# Patient Record
Sex: Male | Born: 1955 | Race: White | Hispanic: No | Marital: Married | Smoking: Never smoker
Health system: Southern US, Community
[De-identification: ages and names within clinical notes are randomized; demographics above are authoritative.]

## PROBLEM LIST (undated history)

## (undated) DIAGNOSIS — J45909 Unspecified asthma, uncomplicated: Secondary | ICD-10-CM

## (undated) DIAGNOSIS — L309 Dermatitis, unspecified: Secondary | ICD-10-CM

## (undated) DIAGNOSIS — R011 Cardiac murmur, unspecified: Secondary | ICD-10-CM

## (undated) DIAGNOSIS — I340 Nonrheumatic mitral (valve) insufficiency: Secondary | ICD-10-CM

## (undated) DIAGNOSIS — I10 Essential (primary) hypertension: Secondary | ICD-10-CM

## (undated) DIAGNOSIS — I2510 Atherosclerotic heart disease of native coronary artery without angina pectoris: Secondary | ICD-10-CM

## (undated) DIAGNOSIS — G4733 Obstructive sleep apnea (adult) (pediatric): Secondary | ICD-10-CM

## (undated) DIAGNOSIS — M109 Gout, unspecified: Secondary | ICD-10-CM

## (undated) DIAGNOSIS — I272 Pulmonary hypertension, unspecified: Secondary | ICD-10-CM

## (undated) DIAGNOSIS — I5042 Chronic combined systolic (congestive) and diastolic (congestive) heart failure: Secondary | ICD-10-CM

## (undated) DIAGNOSIS — M1990 Unspecified osteoarthritis, unspecified site: Secondary | ICD-10-CM

## (undated) HISTORY — PX: APPENDECTOMY: SHX54

## (undated) HISTORY — PX: COLONOSCOPY: SHX174

## (undated) HISTORY — PX: ESOPHAGOGASTRODUODENOSCOPY: SHX1529

## (undated) HISTORY — PX: BACK SURGERY: SHX140

## (undated) HISTORY — PX: LUMBAR DISC SURGERY: SHX700

## (undated) HISTORY — PX: INGUINAL HERNIA REPAIR: SUR1180

---

## 2001-04-30 ENCOUNTER — Encounter: Admission: RE | Admit: 2001-04-30 | Discharge: 2001-04-30 | Payer: Self-pay | Admitting: Neurosurgery

## 2001-04-30 ENCOUNTER — Encounter: Payer: Self-pay | Admitting: Neurosurgery

## 2001-05-14 ENCOUNTER — Encounter: Payer: Self-pay | Admitting: Neurosurgery

## 2001-05-14 ENCOUNTER — Encounter: Admission: RE | Admit: 2001-05-14 | Discharge: 2001-05-14 | Payer: Self-pay | Admitting: Neurosurgery

## 2001-07-10 ENCOUNTER — Encounter: Admission: RE | Admit: 2001-07-10 | Discharge: 2001-07-10 | Payer: Self-pay | Admitting: Neurosurgery

## 2001-07-10 ENCOUNTER — Encounter: Payer: Self-pay | Admitting: Neurosurgery

## 2002-05-05 ENCOUNTER — Ambulatory Visit (HOSPITAL_COMMUNITY): Admission: RE | Admit: 2002-05-05 | Discharge: 2002-05-06 | Payer: Self-pay | Admitting: Cardiovascular Disease

## 2007-06-01 ENCOUNTER — Ambulatory Visit: Payer: Self-pay | Admitting: Cardiology

## 2010-09-26 ENCOUNTER — Other Ambulatory Visit: Payer: Self-pay | Admitting: Medical

## 2012-03-20 ENCOUNTER — Encounter (HOSPITAL_COMMUNITY): Payer: Self-pay | Admitting: *Deleted

## 2012-03-20 ENCOUNTER — Emergency Department (HOSPITAL_COMMUNITY)
Admission: EM | Admit: 2012-03-20 | Discharge: 2012-03-20 | Disposition: A | Discharge status: Home / Self Care | Payer: BC Managed Care – PPO | Attending: Emergency Medicine | Admitting: Emergency Medicine

## 2012-03-20 DIAGNOSIS — Z79899 Other long term (current) drug therapy: Secondary | ICD-10-CM | POA: Insufficient documentation

## 2012-03-20 DIAGNOSIS — T4995XA Adverse effect of unspecified topical agent, initial encounter: Secondary | ICD-10-CM | POA: Insufficient documentation

## 2012-03-20 DIAGNOSIS — J029 Acute pharyngitis, unspecified: Secondary | ICD-10-CM | POA: Insufficient documentation

## 2012-03-20 DIAGNOSIS — J45909 Unspecified asthma, uncomplicated: Secondary | ICD-10-CM | POA: Insufficient documentation

## 2012-03-20 DIAGNOSIS — T7840XA Allergy, unspecified, initial encounter: Secondary | ICD-10-CM

## 2012-03-20 DIAGNOSIS — IMO0002 Reserved for concepts with insufficient information to code with codable children: Secondary | ICD-10-CM | POA: Insufficient documentation

## 2012-03-20 DIAGNOSIS — R220 Localized swelling, mass and lump, head: Secondary | ICD-10-CM | POA: Insufficient documentation

## 2012-03-20 DIAGNOSIS — I10 Essential (primary) hypertension: Secondary | ICD-10-CM | POA: Insufficient documentation

## 2012-03-20 HISTORY — DX: Essential (primary) hypertension: I10

## 2012-03-20 HISTORY — DX: Unspecified asthma, uncomplicated: J45.909

## 2012-03-20 MED ORDER — FAMOTIDINE IN NACL 20-0.9 MG/50ML-% IV SOLN
20.0000 mg | Freq: Once | INTRAVENOUS | Status: AC
  Administered 2012-03-20: 20 mg via INTRAVENOUS
  Filled 2012-03-20: qty 50

## 2012-03-20 MED ORDER — PREDNISONE 10 MG PO TABS: 40.0000 mg | ORAL_TABLET | Freq: Every day | ORAL | Status: DC

## 2012-03-20 MED ORDER — METHYLPREDNISOLONE SODIUM SUCC 125 MG IJ SOLR
125.0000 mg | Freq: Once | INTRAMUSCULAR | Status: AC
  Administered 2012-03-20: 125 mg via INTRAVENOUS
  Filled 2012-03-20: qty 2

## 2012-03-20 MED ORDER — SODIUM CHLORIDE 0.9 % IV SOLN
INTRAVENOUS | Status: DC
  Administered 2012-03-20: 22:00:00 via INTRAVENOUS

## 2012-03-20 MED ORDER — FAMOTIDINE 20 MG PO TABS: 20.0000 mg | ORAL_TABLET | Freq: Two times a day (BID) | ORAL | Status: DC

## 2012-03-20 NOTE — ED Provider Notes (Signed)
History   Scribed for Shelda Jakes, MD, the patient was seen in room APA09/APA09. This chart was scribed by Lewanda Rife, ED scribe. Patient's care was started at 2121  CSN: [id number]  Arrival date & time 03/20/12  2130   First MD Initiated Contact with Patient 03/20/12 2101      Chief Complaint  Patient presents with  . Allergic Reaction    (Consider location/radiation/quality/duration/timing/severity/associated sxs/prior treatment) HPI Stephen Fleming is a 57 y.o. male who presents to the Emergency Department complaining of an allergic reaction with unknown precipitant onset 1 hour PTA. Spouse reports waking pt up tonight with swollen lips. Pt reports taking 2 tablets of benadryl at home and symptoms have significantly improved. Pt reports sneezing and mild sore throat onset yesterday. Pt reports lips swelling and swelling in posterior throat onset PTA. Pt denies tongue swelling, difficulty breathing, nausea, vomiting, and hives. Pt denies new foods, seafood, new laundry detergent, and new medicine.  Past Medical History  Diagnosis Date  . Hypertension   . Asthma     Past Surgical History  Procedure Laterality Date  . Back surgery    . Appendectomy    . Hernia repair      History reviewed. No pertinent family history.  History  Substance Use Topics  . Smoking status: Never Smoker   . Smokeless tobacco: Not on file  . Alcohol Use: Yes      Review of Systems  Constitutional: Negative for fever and chills.  HENT: Positive for sneezing. Negative for congestion, sore throat and rhinorrhea.   Eyes: Negative for visual disturbance.  Respiratory: Negative for cough.   Cardiovascular: Negative for chest pain.  Gastrointestinal: Negative for abdominal pain.  Genitourinary: Negative for dysuria and hematuria.  Musculoskeletal: Negative for myalgias.  Skin: Negative for rash.  Neurological: Negative for headaches.  A complete 10 system review of systems was  obtained and all systems are negative except as noted in the HPI and PMH.     Allergies  Review of patient's allergies indicates no known allergies.  Home Medications   Current Outpatient Rx  Name  Route  Sig  Dispense  Refill  . albuterol (PROVENTIL HFA;VENTOLIN HFA) 108 (90 BASE) MCG/ACT inhaler   Inhalation   Inhale 2 puffs into the lungs every 6 (six) hours as needed for wheezing.         Marland Kitchen aspirin 325 MG tablet   Oral   Take 325 mg by mouth daily.         . diphenhydrAMINE (BENADRYL) 25 MG tablet   Oral   Take 50 mg by mouth once as needed for itching or allergies.         . Fluticasone-Salmeterol (ADVAIR) 250-50 MCG/DOSE AEPB   Inhalation   Inhale 1 puff into the lungs every 12 (twelve) hours.         . hydrochlorothiazide (HYDRODIURIL) 25 MG tablet   Oral   Take 25 mg by mouth daily.         . Multiple Vitamin (MULTIVITAMIN WITH MINERALS) TABS   Oral   Take 1 tablet by mouth daily.         . potassium chloride SA (K-DUR,KLOR-CON) 20 MEQ tablet   Oral   Take 20 mEq by mouth 2 (two) times daily.         . famotidine (PEPCID) 20 MG tablet   Oral   Take 1 tablet (20 mg total) by mouth 2 (two) times daily.   30 tablet  0   . predniSONE (DELTASONE) 10 MG tablet   Oral   Take 4 tablets (40 mg total) by mouth daily.   20 tablet   0     BP 146/86  Pulse 75  Temp(Src) 97.3 F (36.3 C) (Oral)  Resp 14  Ht 5\' 10"  (1.778 m)  Wt 200 lb (90.719 kg)  BMI 28.7 kg/m2  SpO2 100%  Physical Exam  Nursing note and vitals reviewed. Constitutional: He is oriented to person, place, and time. He appears well-developed and well-nourished. No distress.  HENT:  Head: Normocephalic and atraumatic.  Mouth/Throat: Uvula is midline and oropharynx is clear and moist.  Eyes: Conjunctivae and EOM are normal. Pupils are equal, round, and reactive to light. No scleral icterus.  Neck: Normal range of motion. Neck supple. No tracheal deviation present.   Cardiovascular: Normal rate.   Pulmonary/Chest: Effort normal and breath sounds normal. No respiratory distress.  Abdominal: Soft.  Musculoskeletal: Normal range of motion.  Neurological: He is alert and oriented to person, place, and time.  Skin: Skin is warm and dry. No rash noted. Rash is not urticarial.  Psychiatric: He has a normal mood and affect. His behavior is normal.    ED Course  Procedures (including critical care time)  Medications  0.9 %  sodium chloride infusion ( Intravenous New Bag/Given 03/20/12 2149)  methylPREDNISolone sodium succinate (SOLU-MEDROL) 125 mg/2 mL injection 125 mg (125 mg Intravenous Given 03/20/12 2149)  famotidine (PEPCID) IVPB 20 mg (20 mg Intravenous New Bag/Given 03/20/12 2149)    Labs Reviewed - No data to display No results found.   1. Allergic reaction, initial encounter       MDM  Patient by history was significant allergic reaction at home. Had upper and lower lip swelling some tightness in the throat patient 2 Benadryl tablets at home and was improving by arrival in ED. Patient treated in the emergency department with Solu-Medrol and Pepcid IV since the IV was already established will discharge home with continuing Benadryl for the next 24 hours Pepcid for the next 7 days and prednisone for the next 5 days.     I personally performed the services described in this documentation, which was scribed in my presence. The recorded information has been reviewed and is accurate.     Shelda Jakes, MD 03/20/12 [phone]

## 2012-03-20 NOTE — ED Notes (Signed)
Pt feels swelling of lips, mouth and throat, Took benadryl 2 tabs40 min pta

## 2015-05-07 ENCOUNTER — Emergency Department (HOSPITAL_COMMUNITY)
Admission: EM | Admit: 2015-05-07 | Discharge: 2015-05-08 | Disposition: A | Discharge status: Home / Self Care | Payer: BLUE CROSS/BLUE SHIELD | Attending: Emergency Medicine | Admitting: Emergency Medicine

## 2015-05-07 ENCOUNTER — Encounter (HOSPITAL_COMMUNITY): Payer: Self-pay | Admitting: *Deleted

## 2015-05-07 DIAGNOSIS — J45909 Unspecified asthma, uncomplicated: Secondary | ICD-10-CM | POA: Diagnosis not present

## 2015-05-07 DIAGNOSIS — Z79899 Other long term (current) drug therapy: Secondary | ICD-10-CM | POA: Insufficient documentation

## 2015-05-07 DIAGNOSIS — M25511 Pain in right shoulder: Secondary | ICD-10-CM | POA: Insufficient documentation

## 2015-05-07 DIAGNOSIS — Z7982 Long term (current) use of aspirin: Secondary | ICD-10-CM | POA: Insufficient documentation

## 2015-05-07 DIAGNOSIS — I10 Essential (primary) hypertension: Secondary | ICD-10-CM | POA: Insufficient documentation

## 2015-05-07 DIAGNOSIS — M7989 Other specified soft tissue disorders: Secondary | ICD-10-CM | POA: Diagnosis present

## 2015-05-07 DIAGNOSIS — R600 Localized edema: Secondary | ICD-10-CM | POA: Insufficient documentation

## 2015-05-07 HISTORY — DX: Dermatitis, unspecified: L30.9

## 2015-05-07 NOTE — ED Notes (Signed)
Pt c/o bilateral ankle and foot edema x 2 weeks and right shoulder since Friday.

## 2015-05-07 NOTE — ED Provider Notes (Signed)
CSN: [id number]     Arrival date & time 05/07/15  2229 History  By signing my name below, I, Marisue Humble, attest that this documentation has been prepared under the direction and in the presence of Devoria Albe, MD at 0014 AM . Electronically Signed: Marisue Humble, Scribe. 05/08/2015. 12:28 AM.   Chief Complaint  Patient presents with  . Leg Swelling   The history is provided by the patient. No language interpreter was used.   HPI Comments:  Stephen Fleming is a 60 y.o. male with PMHx of HTN who presents to the Emergency Department complaining of worsening BL ankle and leg swelling for the past 2 weeks, worse in left leg. He notes mild pain in knee radiating down front of right leg. Pt reports associated right shoulder pain onset this morning and chills today from 7 to 10 PM. No alleviating or exacerbating factors noted. He reports he was taken off Lisinopril and Hydrochlorothiazide ~1.5 months ago; he was started on amlodipine. Pt is also currently taking an antibiotic, Septra DS for a rash, currently improving. He has a bottle with a month supply with 2 refills of the same antibiotic. He notes drinking 50 ounces of beer every morning when he gets off of work so "I can sleep". Pt reports his mother fell and died at 21 years old of a possible blood clot in her leg. Denies chest pain, shortness of breath, fever, recent travel, recent immobilization, h/o prior leg swelling, cough, h/o smoking, or abdominal distention.  PCP Dr Margo Common  Past Medical History  Diagnosis Date  . Hypertension   . Asthma   . Eczema    Past Surgical History  Procedure Laterality Date  . Back surgery    . Appendectomy    . Hernia repair     History reviewed. No pertinent family history. Social History  Substance Use Topics  . Smoking status: Never Smoker   . Smokeless tobacco: None  . Alcohol Use: Yes  employed  Review of Systems  Constitutional: Positive for chills. Negative for fever.  Respiratory:  Negative for cough and shortness of breath.   Cardiovascular: Positive for leg swelling. Negative for chest pain.  Gastrointestinal: Negative for abdominal distention.  Musculoskeletal: Positive for arthralgias.  All other systems reviewed and are negative.  Allergies  Review of patient's allergies indicates no known allergies.  Home Medications  Norvasc Allopurinol Aspirin 81 mg daily Albuterol inhaler Potassium pills 4 times a day Septra DS since March 29 prescribed by Dr. Jorja Loa  Prior to Admission medications   Medication Sig Start Date End Date Taking? Authorizing Provider  albuterol (PROVENTIL HFA;VENTOLIN HFA) 108 (90 BASE) MCG/ACT inhaler Inhale 2 puffs into the lungs every 6 (six) hours as needed for wheezing.    Historical Provider, MD  aspirin 325 MG tablet Take 325 mg by mouth daily.    Historical Provider, MD  diphenhydrAMINE (BENADRYL) 25 MG tablet Take 50 mg by mouth once as needed for itching or allergies.    Historical Provider, MD  famotidine (PEPCID) 20 MG tablet Take 1 tablet (20 mg total) by mouth 2 (two) times daily. 03/20/12   Vanetta Mulders, MD  Fluticasone-Salmeterol (ADVAIR) 250-50 MCG/DOSE AEPB Inhale 1 puff into the lungs every 12 (twelve) hours.    Historical Provider, MD  hydrochlorothiazide (HYDRODIURIL) 25 MG tablet Take 25 mg by mouth daily.    Historical Provider, MD  Multiple Vitamin (MULTIVITAMIN WITH MINERALS) TABS Take 1 tablet by mouth daily.    Historical Provider, MD  potassium chloride SA (K-DUR,KLOR-CON) 20 MEQ tablet Take 20 mEq by mouth 2 (two) times daily.    Historical Provider, MD  predniSONE (DELTASONE) 10 MG tablet Take 4 tablets (40 mg total) by mouth daily. 03/20/12   Vanetta Mulders, MD   BP 172/100 mmHg  Pulse 102  Temp(Src) 97.8 F (36.6 C) (Oral)  Resp 18  Ht 5\' 10"  (1.778 m)  Wt 205 lb (92.987 kg)  BMI 29.41 kg/m2  SpO2 98%  Vital signs normal except for hypertension  Physical Exam  Constitutional: He is oriented to  person, place, and time. He appears well-developed and well-nourished.  Non-toxic appearance. He does not appear ill. No distress.  HENT:  Head: Normocephalic and atraumatic.  Right Ear: External ear normal.  Left Ear: External ear normal.  Nose: Nose normal. No mucosal edema or rhinorrhea.  Mouth/Throat: Oropharynx is clear and moist and mucous membranes are normal. No dental abscesses or uvula swelling.  Eyes: Conjunctivae and EOM are normal. Pupils are equal, round, and reactive to light.  Neck: Normal range of motion and full passive range of motion without pain. Neck supple.  Cardiovascular: Normal rate, regular rhythm and normal heart sounds.  Exam reveals no gallop and no friction rub.   No murmur heard. Pulmonary/Chest: Effort normal and breath sounds normal. No respiratory distress. He has no wheezes. He has no rhonchi. He has no rales. He exhibits no tenderness and no crepitus.  Abdominal: Soft. Normal appearance and bowel sounds are normal. He exhibits no distension. There is no tenderness. There is no rebound and no guarding.  Abdomen is distended, but patient states is his normal  Musculoskeletal: Normal range of motion. He exhibits edema. He exhibits no tenderness.  Moves all extremities well. 1+ pitting edema of ankles and proximal dorsum of feet  Neurological: He is alert and oriented to person, place, and time. He has normal strength. No cranial nerve deficit.  Skin: Skin is warm, dry and intact. No rash noted. No erythema. No pallor.  Psychiatric: He has a normal mood and affect. His speech is normal and behavior is normal. His mood appears not anxious.  Nursing note and vitals reviewed.   ED Course  Procedures   Medications  furosemide (LASIX) injection 60 mg (60 mg Intravenous Given 05/08/15 0109)  enoxaparin (LOVENOX) injection 90 mg (90 mg Subcutaneous Given 05/08/15 0240)    DIAGNOSTIC STUDIES:  Oxygen Saturation is 98% on RA, normal by my interpretation.     COORDINATION OF CARE:  12:25 AM Will order blood work. Discussed treatment plan with pt at bedside and pt agreed to plan.  Patient had good urinary output after the Lasix.  Patient has no chest pain or shortness of breath. His pulse ox is good. He does not have tachycardia after he arrived in ED. He was given Lovenox 1 mg per KG subcutaneous and scheduled to get a outpatient Doppler ultrasound of his legs in the morning.  Labs Review Results for orders placed or performed during the hospital encounter of 05/07/15  Comprehensive metabolic panel  Result Value Ref Range   Sodium 135 135 - 145 mmol/L   Potassium 3.3 (L) 3.5 - 5.1 mmol/L   Chloride 103 101 - 111 mmol/L   CO2 23 22 - 32 mmol/L   Glucose, Bld 106 (H) 65 - 99 mg/dL   BUN 10 6 - 20 mg/dL   Creatinine, Ser 4.76 0.61 - 1.24 mg/dL   Calcium 8.3 (L) 8.9 - 10.3 mg/dL   Total  Protein 7.2 6.5 - 8.1 g/dL   Albumin 3.9 3.5 - 5.0 g/dL   AST 34 15 - 41 U/L   ALT 35 17 - 63 U/L   Alkaline Phosphatase 70 38 - 126 U/L   Total Bilirubin 0.5 0.3 - 1.2 mg/dL   GFR calc non Af Amer >60 >60 mL/min   GFR calc Af Amer >60 >60 mL/min   Anion gap 9 5 - 15  CBC with Differential  Result Value Ref Range   WBC 10.2 4.0 - 10.5 K/uL   RBC 3.96 (L) 4.22 - 5.81 MIL/uL   Hemoglobin 13.6 13.0 - 17.0 g/dL   HCT 96.0 (L) 45.4 - 09.8 %   MCV 96.2 78.0 - 100.0 fL   MCH 34.3 (H) 26.0 - 34.0 pg   MCHC 35.7 30.0 - 36.0 g/dL   RDW 11.9 14.7 - 82.9 %   Platelets 241 150 - 400 K/uL   Neutrophils Relative % 62 %   Neutro Abs 6.3 1.7 - 7.7 K/uL   Lymphocytes Relative 21 %   Lymphs Abs 2.1 0.7 - 4.0 K/uL   Monocytes Relative 12 %   Monocytes Absolute 1.2 (H) 0.1 - 1.0 K/uL   Eosinophils Relative 5 %   Eosinophils Absolute 0.5 0.0 - 0.7 K/uL   Basophils Relative 0 %   Basophils Absolute 0.0 0.0 - 0.1 K/uL  D-dimer, quantitative  Result Value Ref Range   D-Dimer, Quant 0.68 (H) 0.00 - 0.50 ug/mL-FEU   Laboratory interpretation all normal except  mildly elevated d-dimer     MDM   Final diagnoses:  Edema of both legs    Plan discharge  Devoria Albe, MD, FACEP   I personally performed the services described in this documentation, which was scribed in my presence. The recorded information has been reviewed and considered.  Devoria Albe, MD, Concha Pyo, MD 05/08/15 [phone]

## 2015-05-08 ENCOUNTER — Ambulatory Visit (HOSPITAL_COMMUNITY)
Admission: RE | Admit: 2015-05-08 | Discharge: 2015-05-08 | Disposition: A | Discharge status: Home / Self Care | Payer: BLUE CROSS/BLUE SHIELD | Source: Ambulatory Visit | Attending: Emergency Medicine | Admitting: Emergency Medicine

## 2015-05-08 DIAGNOSIS — R791 Abnormal coagulation profile: Secondary | ICD-10-CM | POA: Insufficient documentation

## 2015-05-08 DIAGNOSIS — M7989 Other specified soft tissue disorders: Secondary | ICD-10-CM | POA: Insufficient documentation

## 2015-05-08 LAB — COMPREHENSIVE METABOLIC PANEL
ALBUMIN: 3.9 g/dL (ref 3.5–5.0)
ALT: 35 U/L (ref 17–63)
ANION GAP: 9 (ref 5–15)
AST: 34 U/L (ref 15–41)
Alkaline Phosphatase: 70 U/L (ref 38–126)
BUN: 10 mg/dL (ref 6–20)
CO2: 23 mmol/L (ref 22–32)
Calcium: 8.3 mg/dL — ABNORMAL LOW (ref 8.9–10.3)
Chloride: 103 mmol/L (ref 101–111)
Creatinine, Ser: 0.86 mg/dL (ref 0.61–1.24)
GFR calc non Af Amer: >60 mL/min (ref >60)
GLUCOSE: 106 mg/dL — AB (ref 65–99)
POTASSIUM: 3.3 mmol/L — AB (ref 3.5–5.1)
SODIUM: 135 mmol/L (ref 135–145)
TOTAL PROTEIN: 7.2 g/dL (ref 6.5–8.1)
Total Bilirubin: 0.5 mg/dL (ref 0.3–1.2)

## 2015-05-08 LAB — CBC WITH DIFFERENTIAL/PLATELET
BASOS ABS: 0 10*3/uL (ref 0.0–0.1)
Basophils Relative: 0 %
Eosinophils Absolute: 0.5 10*3/uL (ref 0.0–0.7)
Eosinophils Relative: 5 %
HEMATOCRIT: 38.1 % — AB (ref 39.0–52.0)
HEMOGLOBIN: 13.6 g/dL (ref 13.0–17.0)
LYMPHS PCT: 21 %
Lymphs Abs: 2.1 10*3/uL (ref 0.7–4.0)
MCH: 34.3 pg — ABNORMAL HIGH (ref 26.0–34.0)
MCHC: 35.7 g/dL (ref 30.0–36.0)
MCV: 96.2 fL (ref 78.0–100.0)
MONO ABS: 1.2 10*3/uL — AB (ref 0.1–1.0)
MONOS PCT: 12 %
NEUTROS ABS: 6.3 10*3/uL (ref 1.7–7.7)
NEUTROS PCT: 62 %
Platelets: 241 10*3/uL (ref 150–400)
RBC: 3.96 MIL/uL — ABNORMAL LOW (ref 4.22–5.81)
RDW: 12.5 % (ref 11.5–15.5)
WBC: 10.2 10*3/uL (ref 4.0–10.5)

## 2015-05-08 LAB — D-DIMER, QUANTITATIVE: D-Dimer, Quant: 0.68 ug/mL-FEU — ABNORMAL HIGH (ref 0.00–0.50)

## 2015-05-08 MED ORDER — FUROSEMIDE 10 MG/ML IJ SOLN
60.0000 mg | Freq: Once | INTRAMUSCULAR | Status: AC
  Administered 2015-05-08: 60 mg via INTRAVENOUS
  Filled 2015-05-08: qty 6

## 2015-05-08 MED ORDER — ENOXAPARIN SODIUM 100 MG/ML ~~LOC~~ SOLN
90.0000 mg | Freq: Once | SUBCUTANEOUS | Status: AC
  Administered 2015-05-08: 90 mg via SUBCUTANEOUS
  Filled 2015-05-08: qty 1

## 2015-05-08 NOTE — Discharge Instructions (Signed)
You have an appointment this morning at 9:45 to get a test of your legs to look for blood clots. Check in in radiology about 15 minutes before your appointment. If your test is normal, you should talk to your doctor about getting back on the hydrochlorothiazide. If your test is positive, meaning you have blood clots, you will receive instructions from the ED doctor about how to treat that.     Peripheral Edema You have swelling in your legs (peripheral edema). This swelling is due to excess accumulation of salt and water in your body. Edema may be a sign of heart, kidney or liver disease, or a side effect of a medication. It may also be due to problems in the leg veins. Elevating your legs and using special support stockings may be very helpful, if the cause of the swelling is due to poor venous circulation. Avoid long periods of standing, whatever the cause. Treatment of edema depends on identifying the cause. Chips, pretzels, pickles and other salty foods should be avoided. Restricting salt in your diet is almost always needed. Water pills (diuretics) are often used to remove the excess salt and water from your body via urine. These medicines prevent the kidney from reabsorbing sodium. This increases urine flow. Diuretic treatment may also result in lowering of potassium levels in your body. Potassium supplements may be needed if you have to use diuretics daily. Daily weights can help you keep track of your progress in clearing your edema. You should call your caregiver for follow up care as recommended. SEEK IMMEDIATE MEDICAL CARE IF:   You have increased swelling, pain, redness, or heat in your legs.  You develop shortness of breath, especially when lying down.  You develop chest or abdominal pain, weakness, or fainting.  You have a fever.   This information is not intended to replace advice given to you by your health care provider. Make sure you discuss any questions you have with your health  care provider.   Document Released: 02/15/2004 Document Revised: 04/01/2011 Document Reviewed: 07/20/2014 Elsevier Interactive Patient Education [employer]

## 2015-06-18 ENCOUNTER — Emergency Department (HOSPITAL_COMMUNITY): Payer: BLUE CROSS/BLUE SHIELD

## 2015-06-18 ENCOUNTER — Encounter (HOSPITAL_COMMUNITY): Payer: Self-pay | Admitting: Emergency Medicine

## 2015-06-18 ENCOUNTER — Emergency Department (HOSPITAL_COMMUNITY)
Admission: EM | Admit: 2015-06-18 | Discharge: 2015-06-18 | Disposition: A | Discharge status: Home / Self Care | Payer: BLUE CROSS/BLUE SHIELD | Attending: Emergency Medicine | Admitting: Emergency Medicine

## 2015-06-18 DIAGNOSIS — K5900 Constipation, unspecified: Secondary | ICD-10-CM | POA: Diagnosis present

## 2015-06-18 DIAGNOSIS — Z79899 Other long term (current) drug therapy: Secondary | ICD-10-CM | POA: Insufficient documentation

## 2015-06-18 DIAGNOSIS — J45909 Unspecified asthma, uncomplicated: Secondary | ICD-10-CM | POA: Insufficient documentation

## 2015-06-18 DIAGNOSIS — Z7982 Long term (current) use of aspirin: Secondary | ICD-10-CM | POA: Insufficient documentation

## 2015-06-18 DIAGNOSIS — R109 Unspecified abdominal pain: Secondary | ICD-10-CM | POA: Insufficient documentation

## 2015-06-18 DIAGNOSIS — I10 Essential (primary) hypertension: Secondary | ICD-10-CM | POA: Insufficient documentation

## 2015-06-18 LAB — URINALYSIS, ROUTINE W REFLEX MICROSCOPIC
Bilirubin Urine: NEGATIVE
Glucose, UA: NEGATIVE mg/dL
Hgb urine dipstick: NEGATIVE
KETONES UR: NEGATIVE mg/dL
LEUKOCYTES UA: NEGATIVE
NITRITE: NEGATIVE
PH: 6 (ref 5.0–8.0)
Protein, ur: NEGATIVE mg/dL
SPECIFIC GRAVITY, URINE: 1.015 (ref 1.005–1.030)

## 2015-06-18 LAB — CBC
HEMATOCRIT: 44.9 % (ref 39.0–52.0)
HEMOGLOBIN: 15.5 g/dL (ref 13.0–17.0)
MCH: 34.1 pg — ABNORMAL HIGH (ref 26.0–34.0)
MCHC: 34.5 g/dL (ref 30.0–36.0)
MCV: 98.7 fL (ref 78.0–100.0)
Platelets: 199 10*3/uL (ref 150–400)
RBC: 4.55 MIL/uL (ref 4.22–5.81)
RDW: 12.5 % (ref 11.5–15.5)
WBC: 15 10*3/uL — AB (ref 4.0–10.5)

## 2015-06-18 LAB — COMPREHENSIVE METABOLIC PANEL
ALT: 69 U/L — ABNORMAL HIGH (ref 17–63)
ANION GAP: 10 (ref 5–15)
AST: 47 U/L — ABNORMAL HIGH (ref 15–41)
Albumin: 4.1 g/dL (ref 3.5–5.0)
Alkaline Phosphatase: 57 U/L (ref 38–126)
BILIRUBIN TOTAL: 0.8 mg/dL (ref 0.3–1.2)
BUN: 17 mg/dL (ref 6–20)
CHLORIDE: 104 mmol/L (ref 101–111)
CO2: 25 mmol/L (ref 22–32)
Calcium: 8.9 mg/dL (ref 8.9–10.3)
Creatinine, Ser: 0.69 mg/dL (ref 0.61–1.24)
GFR calc Af Amer: >60 mL/min (ref >60)
Glucose, Bld: 100 mg/dL — ABNORMAL HIGH (ref 65–99)
POTASSIUM: 3.3 mmol/L — AB (ref 3.5–5.1)
Sodium: 139 mmol/L (ref 135–145)
TOTAL PROTEIN: 7.2 g/dL (ref 6.5–8.1)

## 2015-06-18 LAB — LIPASE, BLOOD: LIPASE: 41 U/L (ref 11–51)

## 2015-06-18 NOTE — Discharge Instructions (Signed)
Take over the counter laxative (such as miralax, milk of magnesia, senokot) AND a dulcolax suppository (or enema) today and repeat both tomorrow.  Begin to take over the counter stool softener (colace, miralax), as directed on packaging, for the next month.  Continue to take your usual prescriptions as previously directed.  Call your regular medical doctor on Monday to schedule a follow up appointment this week. Return to the Emergency Department immediately if worsening.

## 2015-06-18 NOTE — ED Provider Notes (Signed)
CSN: [id number]     Arrival date & time 06/18/15  1649 History   First MD Initiated Contact with Patient 06/18/15 1907     Chief Complaint  Patient presents with  . Constipation      HPI  Pt was seen at 1910. Per pt, c/o gradual onset and persistence of constant "constipation" that began today. Pt states he has a BM every morning, and did not have one this morning. Last BM was yesterday. Pt states he took a suppository, but "I think it was old because it didn't work." Pt states also that he "missed a few days" taking his stool softener, probiotics and fiber supplements. States he is having abd "cramping" and "it feels like I have to go." Denies N/V, no CP/SOB, no fevers, no black or blood in stools, no rectal pain.    Past Medical History  Diagnosis Date  . Hypertension   . Asthma   . Eczema    Past Surgical History  Procedure Laterality Date  . Back surgery    . Appendectomy    . Hernia repair      Social History  Substance Use Topics  . Smoking status: Never Smoker   . Smokeless tobacco: None  . Alcohol Use: Yes    Review of Systems ROS: Statement: All systems negative except as marked or noted in the HPI; Constitutional: Negative for fever and chills. ; ; Eyes: Negative for eye pain, redness and discharge. ; ; ENMT: Negative for ear pain, hoarseness, nasal congestion, sinus pressure and sore throat. ; ; Cardiovascular: Negative for chest pain, palpitations, diaphoresis, dyspnea and peripheral edema. ; ; Respiratory: Negative for cough, wheezing and stridor. ; ; Gastrointestinal: +constipation, abd pain. Negative for nausea, vomiting, diarrhea, blood in stool, hematemesis, jaundice and rectal bleeding. . ; ; Genitourinary: Negative for dysuria, flank pain and hematuria. ; ; Musculoskeletal: Negative for back pain and neck pain. Negative for swelling and trauma.; ; Skin: Negative for pruritus, rash, abrasions, blisters, bruising and skin lesion.; ; Neuro: Negative for headache,  lightheadedness and neck stiffness. Negative for weakness, altered level of consciousness, altered mental status, extremity weakness, paresthesias, involuntary movement, seizure and syncope.      Allergies  Review of patient's allergies indicates no known allergies.  Home Medications   Prior to Admission medications   Medication Sig Start Date End Date Taking? Authorizing Provider  albuterol (PROVENTIL HFA;VENTOLIN HFA) 108 (90 BASE) MCG/ACT inhaler Inhale 2 puffs into the lungs every 6 (six) hours as needed for wheezing.    Historical Provider, MD  aspirin 325 MG tablet Take 325 mg by mouth daily.    Historical Provider, MD  diphenhydrAMINE (BENADRYL) 25 MG tablet Take 50 mg by mouth once as needed for itching or allergies.    Historical Provider, MD  famotidine (PEPCID) 20 MG tablet Take 1 tablet (20 mg total) by mouth 2 (two) times daily. 03/20/12   Vanetta Mulders, MD  Fluticasone-Salmeterol (ADVAIR) 250-50 MCG/DOSE AEPB Inhale 1 puff into the lungs every 12 (twelve) hours.    Historical Provider, MD  hydrochlorothiazide (HYDRODIURIL) 25 MG tablet Take 25 mg by mouth daily.    Historical Provider, MD  Multiple Vitamin (MULTIVITAMIN WITH MINERALS) TABS Take 1 tablet by mouth daily.    Historical Provider, MD  potassium chloride SA (K-DUR,KLOR-CON) 20 MEQ tablet Take 20 mEq by mouth 2 (two) times daily.    Historical Provider, MD  predniSONE (DELTASONE) 10 MG tablet Take 4 tablets (40 mg total) by mouth daily.  03/20/12   Vanetta Mulders, MD   BP 172/99 mmHg  Pulse 99  Temp(Src) 98.4 F (36.9 C) (Oral)  Resp 18  Ht 5\' 10"  (1.778 m)  Wt 205 lb (92.987 kg)  BMI 29.41 kg/m2  SpO2 98% Physical Exam  1915: Physical examination:  Nursing notes reviewed; Vital signs and O2 SAT reviewed;  Constitutional: Well developed, Well nourished, Well hydrated, In no acute distress; Head:  Normocephalic, atraumatic; Eyes: EOMI, PERRL, No scleral icterus; ENMT: Mouth and pharynx normal, Mucous membranes  moist; Neck: Supple, Full range of motion, No lymphadenopathy; Cardiovascular: Regular rate and rhythm, No gallop; Respiratory: Breath sounds clear & equal bilaterally, No wheezes.  Speaking full sentences with ease, Normal respiratory effort/excursion; Chest: Nontender, Movement normal; Abdomen: Soft, Nontender, Nondistended, Normal bowel sounds; Genitourinary: No CVA tenderness; Extremities: Pulses normal, No tenderness, No edema, No calf edema or asymmetry.; Neuro: AA&Ox3, Major CN grossly intact.  Speech clear. No gross focal motor or sensory deficits in extremities. Climbs on and off stretcher easily by himself. Gait steady.; Skin: Color normal, Warm, Dry.   ED Course  Procedures (including critical care time) Labs Review   Imaging Review  I have personally reviewed and evaluated these images and lab results as part of my medical decision-making.   EKG Interpretation None      MDM  MDM Reviewed: previous chart, nursing note and vitals Reviewed previous: labs Interpretation: labs and x-ray     Dg Abd Acute W/chest 06/18/2015  CLINICAL DATA:  Constipation EXAM: DG ABDOMEN ACUTE W/ 1V CHEST COMPARISON:  None. FINDINGS: There is no evidence of dilated bowel loops or free intraperitoneal air. No radiopaque calculi or other significant radiographic abnormality is seen. Mild to moderate amount of stool in the right and left colon and rectum. Lumbar degenerative changes and spurring no acute skeletal abnormality. Heart size and mediastinal contours are within normal limits. Both lungs are clear. IMPRESSION: No active cardiopulmonary disease Mild to moderate stool in the colon.  Normal bowel gas pattern. Electronically Signed   By: Marlan Palau M.D.   On: 06/18/2015 20:16    2030:  XR reassuring. Abd benign. Tx symptomatically at this time. Dx and testing d/w pt.  Questions answered.  Verb understanding, agreeable to d/c home with outpt f/u.   Samuel Jester, DO 06/21/15 2043

## 2015-06-18 NOTE — ED Notes (Signed)
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 He reports he does not know when his last BM was.

## 2015-06-18 NOTE — ED Notes (Signed)
Pt reports last bm yesterday morning and usually has BM every morning at the same time.  Pt did not have bm this morning, took suppository (pt thinks it was old) and had no relief.  Pt has generalized abdominal pain. Pt has no urinary symptom/n/v.

## 2015-06-18 NOTE — ED Notes (Signed)
Pt stated he was ready to go was not interested in education regarding constipation-

## 2015-09-26 ENCOUNTER — Ambulatory Visit: Payer: Self-pay | Admitting: Allergy & Immunology

## 2016-12-03 ENCOUNTER — Inpatient Hospital Stay (HOSPITAL_COMMUNITY)
Admission: AD | Admit: 2016-12-03 | Discharge: 2016-12-06 | DRG: 287 | Disposition: A | Discharge status: Home / Self Care | Payer: BLUE CROSS/BLUE SHIELD | Source: Other Acute Inpatient Hospital | Attending: Cardiovascular Disease | Admitting: Cardiovascular Disease

## 2016-12-03 ENCOUNTER — Other Ambulatory Visit: Payer: Self-pay

## 2016-12-03 ENCOUNTER — Encounter (HOSPITAL_COMMUNITY): Payer: Self-pay | Admitting: General Practice

## 2016-12-03 DIAGNOSIS — J45909 Unspecified asthma, uncomplicated: Secondary | ICD-10-CM | POA: Diagnosis present

## 2016-12-03 DIAGNOSIS — I5023 Acute on chronic systolic (congestive) heart failure: Secondary | ICD-10-CM | POA: Diagnosis present

## 2016-12-03 DIAGNOSIS — I10 Essential (primary) hypertension: Secondary | ICD-10-CM

## 2016-12-03 DIAGNOSIS — I340 Nonrheumatic mitral (valve) insufficiency: Secondary | ICD-10-CM

## 2016-12-03 DIAGNOSIS — Z7982 Long term (current) use of aspirin: Secondary | ICD-10-CM

## 2016-12-03 DIAGNOSIS — Z79899 Other long term (current) drug therapy: Secondary | ICD-10-CM | POA: Diagnosis not present

## 2016-12-03 DIAGNOSIS — M1990 Unspecified osteoarthritis, unspecified site: Secondary | ICD-10-CM | POA: Diagnosis present

## 2016-12-03 DIAGNOSIS — I272 Pulmonary hypertension, unspecified: Secondary | ICD-10-CM | POA: Diagnosis present

## 2016-12-03 DIAGNOSIS — I2510 Atherosclerotic heart disease of native coronary artery without angina pectoris: Secondary | ICD-10-CM

## 2016-12-03 DIAGNOSIS — R0600 Dyspnea, unspecified: Secondary | ICD-10-CM | POA: Diagnosis present

## 2016-12-03 DIAGNOSIS — Z7952 Long term (current) use of systemic steroids: Secondary | ICD-10-CM | POA: Diagnosis not present

## 2016-12-03 DIAGNOSIS — R011 Cardiac murmur, unspecified: Secondary | ICD-10-CM | POA: Diagnosis present

## 2016-12-03 DIAGNOSIS — I429 Cardiomyopathy, unspecified: Secondary | ICD-10-CM | POA: Diagnosis present

## 2016-12-03 DIAGNOSIS — I361 Nonrheumatic tricuspid (valve) insufficiency: Secondary | ICD-10-CM | POA: Diagnosis not present

## 2016-12-03 DIAGNOSIS — Z7951 Long term (current) use of inhaled steroids: Secondary | ICD-10-CM | POA: Diagnosis not present

## 2016-12-03 DIAGNOSIS — I5043 Acute on chronic combined systolic (congestive) and diastolic (congestive) heart failure: Secondary | ICD-10-CM | POA: Diagnosis not present

## 2016-12-03 DIAGNOSIS — I5020 Unspecified systolic (congestive) heart failure: Secondary | ICD-10-CM | POA: Diagnosis present

## 2016-12-03 DIAGNOSIS — G4733 Obstructive sleep apnea (adult) (pediatric): Secondary | ICD-10-CM | POA: Diagnosis present

## 2016-12-03 DIAGNOSIS — I110 Hypertensive heart disease with heart failure: Principal | ICD-10-CM | POA: Diagnosis present

## 2016-12-03 DIAGNOSIS — M109 Gout, unspecified: Secondary | ICD-10-CM | POA: Diagnosis present

## 2016-12-03 HISTORY — DX: Unspecified osteoarthritis, unspecified site: M19.90

## 2016-12-03 HISTORY — DX: Cardiac murmur, unspecified: R01.1

## 2016-12-03 HISTORY — DX: Pulmonary hypertension, unspecified: I27.20

## 2016-12-03 HISTORY — DX: Chronic combined systolic (congestive) and diastolic (congestive) heart failure: I50.42

## 2016-12-03 HISTORY — DX: Obstructive sleep apnea (adult) (pediatric): G47.33

## 2016-12-03 HISTORY — DX: Atherosclerotic heart disease of native coronary artery without angina pectoris: I25.10

## 2016-12-03 HISTORY — DX: Gout, unspecified: M10.9

## 2016-12-03 HISTORY — DX: Nonrheumatic mitral (valve) insufficiency: I34.0

## 2016-12-03 MED ORDER — FUROSEMIDE 10 MG/ML IJ SOLN
20.0000 mg | Freq: Once | INTRAMUSCULAR | Status: AC
  Administered 2016-12-04: 20 mg via INTRAVENOUS

## 2016-12-03 MED ORDER — LOSARTAN POTASSIUM-HCTZ 50-12.5 MG PO TABS: 1.0000 | ORAL_TABLET | Freq: Every day | ORAL | Status: DC

## 2016-12-03 MED ORDER — ASPIRIN EC 81 MG PO TBEC
81.0000 mg | DELAYED_RELEASE_TABLET | Freq: Every day | ORAL | Status: DC
  Administered 2016-12-04 – 2016-12-06 (×3): 81 mg via ORAL
  Filled 2016-12-03 (×3): qty 1

## 2016-12-03 MED ORDER — ONDANSETRON HCL 4 MG/2ML IJ SOLN: 4.0000 mg | Freq: Four times a day (QID) | INTRAMUSCULAR | Status: DC | PRN

## 2016-12-03 MED ORDER — ALBUTEROL SULFATE (2.5 MG/3ML) 0.083% IN NEBU: 2.5000 mg | INHALATION_SOLUTION | Freq: Four times a day (QID) | RESPIRATORY_TRACT | Status: DC | PRN

## 2016-12-03 MED ORDER — SODIUM CHLORIDE 0.9% FLUSH: 3.0000 mL | INTRAVENOUS | Status: DC | PRN

## 2016-12-03 MED ORDER — ACETAMINOPHEN 325 MG PO TABS: 650.0000 mg | ORAL_TABLET | ORAL | Status: DC | PRN

## 2016-12-03 MED ORDER — POTASSIUM CHLORIDE CRYS ER 20 MEQ PO TBCR
40.0000 meq | EXTENDED_RELEASE_TABLET | Freq: Two times a day (BID) | ORAL | Status: DC
  Administered 2016-12-04 – 2016-12-05 (×4): 40 meq via ORAL
  Filled 2016-12-03 (×4): qty 2

## 2016-12-03 MED ORDER — SODIUM CHLORIDE 0.9% FLUSH: 3.0000 mL | Freq: Two times a day (BID) | INTRAVENOUS | Status: DC

## 2016-12-03 MED ORDER — FUROSEMIDE 10 MG/ML IJ SOLN: 20.0000 mg/h | Freq: Once | INTRAVENOUS | Status: DC

## 2016-12-03 MED ORDER — DIPHENHYDRAMINE HCL 25 MG PO CAPS
50.0000 mg | ORAL_CAPSULE | Freq: Every day | ORAL | Status: DC | PRN
Start: 2016-12-04 — End: 2016-12-06

## 2016-12-03 MED ORDER — PREDNISONE 20 MG PO TABS
40.0000 mg | ORAL_TABLET | Freq: Every day | ORAL | Status: DC
Start: 2016-12-04 — End: 2016-12-04

## 2016-12-03 MED ORDER — MOMETASONE FURO-FORMOTEROL FUM 200-5 MCG/ACT IN AERO
2.0000 | INHALATION_SPRAY | Freq: Two times a day (BID) | RESPIRATORY_TRACT | Status: DC
  Administered 2016-12-05 – 2016-12-06 (×3): 2 via RESPIRATORY_TRACT
  Filled 2016-12-03 (×2): qty 8.8

## 2016-12-03 MED ORDER — HEPARIN SODIUM (PORCINE) 5000 UNIT/ML IJ SOLN
5000.0000 [IU] | Freq: Three times a day (TID) | INTRAMUSCULAR | Status: DC
  Administered 2016-12-04: 5000 [IU] via SUBCUTANEOUS
  Filled 2016-12-03 (×2): qty 1

## 2016-12-03 MED ORDER — BACID PO TABS
1.0000 | ORAL_TABLET | Freq: Every day | ORAL | Status: DC
  Administered 2016-12-04 – 2016-12-06 (×3): 1 via ORAL
  Filled 2016-12-03 (×3): qty 1

## 2016-12-03 MED ORDER — LOSARTAN POTASSIUM 25 MG PO TABS: 25.0000 mg | ORAL_TABLET | Freq: Every day | ORAL | Status: DC

## 2016-12-03 MED ORDER — ALBUTEROL SULFATE 2 MG PO TABS
2.0000 mg | ORAL_TABLET | Freq: Every day | ORAL | Status: DC
  Administered 2016-12-04 – 2016-12-06 (×3): 2 mg via ORAL
  Filled 2016-12-03 (×3): qty 1

## 2016-12-03 MED ORDER — SODIUM CHLORIDE 0.9 % IV SOLN
250.0000 mL | INTRAVENOUS | Status: DC | PRN
Start: 2016-12-03 — End: 2016-12-04

## 2016-12-03 NOTE — H&P (Signed)
Stephen MallickRicky Haugan is an 61 y.o. male.     HISTORY & PHYSICAL    No cardiologist  [facility] Pt [facility]  [facility]  [place] DOB: 2055-08-20 MRN [id number] Chief  Complaint: Dyspnea  HPI  Increasing  Dyspnea, on exertion , climbing  Staits, slight  Chest discomfort  On  A  Daily basis , pt  Works well  Like  Brewing technologistA  Mechanic, never  Had  Any  Cardiology visits  In the  Last  Three  Years,   Past  History : Stephen Fleming is a 61 y.o. male with PMHx of HTN who presents to the Emergency Department complaining of worsening BL ankle and leg swelling for the past 2 weeks, worse in left leg.  REVIEWED [facility]  Records EKG ECHO  EF  20, DILATED, Moderqate MR, possible AI , Pulm HTN , Moderate, Moderate TR.      Past Medical History:  Diagnosis Date  . Arthritis    "probably in all my joints" (12/03/2016)  . Asthma   . Eczema   . Gout    "I take RX for it" (12/03/2016)  . Heart murmur   . Hypertension   . OSA (obstructive sleep apnea)    "never could tolerate the mask" (12/03/2016)    Past Surgical History:  Procedure Laterality Date  . APPENDECTOMY  ~  1967  . BACK SURGERY    . COLONOSCOPY  ~ 2008  . ESOPHAGOGASTRODUODENOSCOPY  ~ 2008  . INGUINAL HERNIA REPAIR Right ~ 1976  . LUMBAR DISC SURGERY  1991; 1993   "Jonne PlySteven Robinson did them both"    History reviewed. No pertinent family history. Social History:  reports that  has never smoked. he has never used smokeless tobacco. He reports that he drinks about 12.0 oz of alcohol per week. He reports that he does not use drugs.  Allergies: No Known Allergies  Medications Prior to Admission  Medication Sig Dispense Refill  . albuterol (PROVENTIL HFA;VENTOLIN HFA) 108 (90 BASE) MCG/ACT inhaler Inhale 2 puffs into the lungs every 6 (six) hours as needed for wheezing.    . ALBUTEROL SULFATE PO Take 1 tablet by mouth daily.    Marland Kitchen. aspirin 81 MG tablet Take 81 mg by mouth daily.    . diphenhydrAMINE (BENADRYL) 25 MG tablet Take 50 mg by mouth  once as needed for itching or allergies.    . Fluticasone-Salmeterol (ADVAIR) 250-50 MCG/DOSE AEPB Inhale 1 puff into the lungs every 12 (twelve) hours.    . lactobacillus acidophilus (BACID) TABS tablet Take 1 tablet by mouth daily. PROBIOTIC    . LOSARTAN POTASSIUM-HCTZ PO Take 1 tablet by mouth daily.    . Multiple Vitamin (MULTIVITAMIN WITH MINERALS) TABS Take 1 tablet by mouth daily.    . potassium chloride SA (K-DUR,KLOR-CON) 20 MEQ tablet Take 40 mEq by mouth 2 (two) times daily.     . predniSONE (DELTASONE) 10 MG tablet Take 4 tablets (40 mg total) by mouth daily. 20 tablet 0    No results found for this or any previous visit (from the past 48 hour(s)). No results found.  Review of Systems  Constitutional: Negative.   HENT: Negative.   Eyes: Negative.   Respiratory: Positive for shortness of breath.   Cardiovascular: Positive for chest pain and orthopnea.  Gastrointestinal: Negative.   Genitourinary: Negative.   Musculoskeletal: Negative.   Skin: Negative.   Neurological: Negative.   Endo/Heme/Allergies: Negative.   Psychiatric/Behavioral: Negative.     Blood pressure 107/87,  pulse (!) 105, temperature 97.7 F (36.5 C), temperature source Oral, resp. rate 20, height 5\' 10"  (1.778 m), weight 86 kg (189 lb 11.2 oz), SpO2 96 %. Physical Exam  Constitutional: He is oriented to person, place, and time. He appears well-developed.  HENT:  Head: Normocephalic.  Neck: Normal range of motion.  Cardiovascular: Normal rate.  Murmur heard. GI: Soft.  Neurological: He is alert and oriented to person, place, and time.  Skin: Skin is warm.    EKG: LBBB, supposedly  New, no  OLD  EKG here in Epic  To review   Assessment/Plan  CARDIOMYOPATHY  Stage C EF Dilated LV, NHYA Class II Global wall hypokinesis- 20 percent. EKG-   RECORDS  Sent  From [facility]   HTN  Asthma  Plan :Acute on chronic CHF, systolic Stage C nyha Class III Diuresis: Lasix 20 mg  IV Lisinopril Pt  does not have  Chest pain,  Will keep NPO  To eval with cardiac  Cath to eval for  CAD  As etiology  If  No  Obstructive CAD, will then need  Med  optimisation  And close  Cardiology  Follow up  Lynwood Dawley, MD 12/03/2016, 11:29 PM

## 2016-12-04 ENCOUNTER — Inpatient Hospital Stay (HOSPITAL_COMMUNITY): Payer: BLUE CROSS/BLUE SHIELD

## 2016-12-04 ENCOUNTER — Encounter (HOSPITAL_COMMUNITY)
Admission: AD | Disposition: A | Discharge status: Home / Self Care | Payer: Self-pay | Source: Other Acute Inpatient Hospital | Attending: Cardiology

## 2016-12-04 DIAGNOSIS — I5020 Unspecified systolic (congestive) heart failure: Secondary | ICD-10-CM

## 2016-12-04 DIAGNOSIS — I5043 Acute on chronic combined systolic (congestive) and diastolic (congestive) heart failure: Secondary | ICD-10-CM

## 2016-12-04 DIAGNOSIS — I10 Essential (primary) hypertension: Secondary | ICD-10-CM

## 2016-12-04 DIAGNOSIS — I361 Nonrheumatic tricuspid (valve) insufficiency: Secondary | ICD-10-CM

## 2016-12-04 HISTORY — PX: RIGHT/LEFT HEART CATH AND CORONARY ANGIOGRAPHY: CATH118266

## 2016-12-04 LAB — BASIC METABOLIC PANEL
Anion gap: 8 (ref 5–15)
BUN: 16 mg/dL (ref 6–20)
CHLORIDE: 103 mmol/L (ref 101–111)
CO2: 25 mmol/L (ref 22–32)
CREATININE: 1.02 mg/dL (ref 0.61–1.24)
Calcium: 9 mg/dL (ref 8.9–10.3)
GFR calc Af Amer: >60 mL/min (ref >60)
GFR calc non Af Amer: >60 mL/min (ref >60)
GLUCOSE: 112 mg/dL — AB (ref 65–99)
POTASSIUM: 3.8 mmol/L (ref 3.5–5.1)
Sodium: 136 mmol/L (ref 135–145)

## 2016-12-04 LAB — CREATININE, SERUM
Creatinine, Ser: 0.96 mg/dL (ref 0.61–1.24)
GFR calc Af Amer: >60 mL/min (ref >60)
GFR calc non Af Amer: >60 mL/min (ref >60)

## 2016-12-04 LAB — POCT I-STAT 3, ART BLOOD GAS (G3+)
Acid-base deficit: 2 mmol/L (ref 0.0–2.0)
BICARBONATE: 21.2 mmol/L (ref 20.0–28.0)
O2 Saturation: 97 %
PH ART: 7.442 (ref 7.350–7.450)
TCO2: 22 mmol/L (ref 22–32)
pCO2 arterial: 31.1 mmHg — ABNORMAL LOW (ref 32.0–48.0)
pO2, Arterial: 89 mmHg (ref 83.0–108.0)

## 2016-12-04 LAB — CBC
HCT: 46.5 % (ref 39.0–52.0)
HEMATOCRIT: 44.2 % (ref 39.0–52.0)
Hemoglobin: 15.2 g/dL (ref 13.0–17.0)
Hemoglobin: 15.7 g/dL (ref 13.0–17.0)
MCH: 33.3 pg (ref 26.0–34.0)
MCH: 33.8 pg (ref 26.0–34.0)
MCHC: 33.8 g/dL (ref 30.0–36.0)
MCHC: 34.4 g/dL (ref 30.0–36.0)
MCV: 98.7 fL (ref 78.0–100.0)
MCV: 98.2 fL (ref 78.0–100.0)
PLATELETS: 219 10*3/uL (ref 150–400)
PLATELETS: 207 10*3/uL (ref 150–400)
RBC: 4.5 MIL/uL (ref 4.22–5.81)
RBC: 4.71 MIL/uL (ref 4.22–5.81)
RDW: 13 % (ref 11.5–15.5)
RDW: 13.1 % (ref 11.5–15.5)
WBC: 9.8 10*3/uL (ref 4.0–10.5)
WBC: 9 10*3/uL (ref 4.0–10.5)

## 2016-12-04 LAB — HIV ANTIBODY (ROUTINE TESTING W REFLEX): HIV Screen 4th Generation wRfx: NONREACTIVE

## 2016-12-04 LAB — PROTIME-INR
INR: 1.13
Prothrombin Time: 14.5 seconds (ref 11.4–15.2)

## 2016-12-04 LAB — POCT I-STAT 3, VENOUS BLOOD GAS (G3P V)
Bicarbonate: 24.7 mmol/L (ref 20.0–28.0)
O2 SAT: 58 %
PCO2 VEN: 38.6 mmHg — AB (ref 44.0–60.0)
TCO2: 26 mmol/L (ref 22–32)
pH, Ven: 7.413 (ref 7.250–7.430)
pO2, Ven: 30 mmHg — CL (ref 32.0–45.0)

## 2016-12-04 LAB — ECHOCARDIOGRAM COMPLETE
Height: 70 in
Weight: 2948.8 oz

## 2016-12-04 LAB — BRAIN NATRIURETIC PEPTIDE: B Natriuretic Peptide: 1027.4 pg/mL — ABNORMAL HIGH (ref 0.0–100.0)

## 2016-12-04 SURGERY — RIGHT/LEFT HEART CATH AND CORONARY ANGIOGRAPHY
Anesthesia: LOCAL

## 2016-12-04 MED ORDER — SODIUM CHLORIDE 0.9 % IV SOLN
INTRAVENOUS | Status: DC
  Administered 2016-12-04: 10:00:00 via INTRAVENOUS

## 2016-12-04 MED ORDER — PERFLUTREN LIPID MICROSPHERE
1.0000 mL | INTRAVENOUS | Status: DC | PRN
  Administered 2016-12-04: 2 mL via INTRAVENOUS
  Filled 2016-12-04: qty 10

## 2016-12-04 MED ORDER — FENTANYL CITRATE (PF) 100 MCG/2ML IJ SOLN
INTRAMUSCULAR | Status: AC
  Filled 2016-12-04: qty 2

## 2016-12-04 MED ORDER — SODIUM CHLORIDE 0.9% FLUSH: 3.0000 mL | INTRAVENOUS | Status: DC | PRN

## 2016-12-04 MED ORDER — LIDOCAINE HCL (PF) 1 % IJ SOLN
INTRAMUSCULAR | Status: DC | PRN
  Administered 2016-12-04: 1 mL via INTRADERMAL
  Administered 2016-12-04: 2 mL via INTRADERMAL

## 2016-12-04 MED ORDER — VERAPAMIL HCL 2.5 MG/ML IV SOLN
INTRAVENOUS | Status: AC
  Filled 2016-12-04: qty 2

## 2016-12-04 MED ORDER — HEPARIN SODIUM (PORCINE) 1000 UNIT/ML IJ SOLN
INTRAMUSCULAR | Status: DC | PRN
  Administered 2016-12-04: 4000 [IU] via INTRAVENOUS

## 2016-12-04 MED ORDER — HEPARIN SODIUM (PORCINE) 5000 UNIT/ML IJ SOLN
5000.0000 [IU] | Freq: Three times a day (TID) | INTRAMUSCULAR | Status: DC
  Administered 2016-12-04 – 2016-12-06 (×5): 5000 [IU] via SUBCUTANEOUS
  Filled 2016-12-04 (×5): qty 1

## 2016-12-04 MED ORDER — MIDAZOLAM HCL 2 MG/2ML IJ SOLN
INTRAMUSCULAR | Status: AC
  Filled 2016-12-04: qty 2

## 2016-12-04 MED ORDER — HEPARIN SODIUM (PORCINE) 1000 UNIT/ML IJ SOLN
INTRAMUSCULAR | Status: AC
  Filled 2016-12-04: qty 1

## 2016-12-04 MED ORDER — FUROSEMIDE 10 MG/ML IJ SOLN
INTRAMUSCULAR | Status: AC
  Filled 2016-12-04: qty 2

## 2016-12-04 MED ORDER — ASPIRIN 81 MG PO CHEW: 81.0000 mg | CHEWABLE_TABLET | ORAL | Status: DC

## 2016-12-04 MED ORDER — IOPAMIDOL (ISOVUE-370) INJECTION 76%
INTRAVENOUS | Status: DC | PRN
  Administered 2016-12-04: 50 mL via INTRA_ARTERIAL

## 2016-12-04 MED ORDER — SODIUM CHLORIDE 0.9% FLUSH
3.0000 mL | Freq: Two times a day (BID) | INTRAVENOUS | Status: DC
  Administered 2016-12-04 – 2016-12-06 (×4): 3 mL via INTRAVENOUS

## 2016-12-04 MED ORDER — SODIUM CHLORIDE 0.9% FLUSH
3.0000 mL | Freq: Two times a day (BID) | INTRAVENOUS | Status: DC
  Administered 2016-12-04: 3 mL via INTRAVENOUS

## 2016-12-04 MED ORDER — LIDOCAINE HCL (PF) 1 % IJ SOLN
INTRAMUSCULAR | Status: AC
  Filled 2016-12-04: qty 30

## 2016-12-04 MED ORDER — PERFLUTREN LIPID MICROSPHERE
INTRAVENOUS | Status: AC
  Filled 2016-12-04: qty 10

## 2016-12-04 MED ORDER — SODIUM CHLORIDE 0.9 % IV SOLN: 250.0000 mL | INTRAVENOUS | Status: DC | PRN

## 2016-12-04 MED ORDER — FENTANYL CITRATE (PF) 100 MCG/2ML IJ SOLN
INTRAMUSCULAR | Status: DC | PRN
  Administered 2016-12-04: 25 ug via INTRAVENOUS

## 2016-12-04 MED ORDER — MIDAZOLAM HCL 2 MG/2ML IJ SOLN
INTRAMUSCULAR | Status: DC | PRN
  Administered 2016-12-04: 2 mg via INTRAVENOUS

## 2016-12-04 MED ORDER — IOPAMIDOL (ISOVUE-370) INJECTION 76%
INTRAVENOUS | Status: AC
  Filled 2016-12-04: qty 100

## 2016-12-04 MED ORDER — HEPARIN (PORCINE) IN NACL 2-0.9 UNIT/ML-% IJ SOLN
INTRAMUSCULAR | Status: AC
  Filled 2016-12-04: qty 1000

## 2016-12-04 MED ORDER — HEPARIN (PORCINE) IN NACL 2-0.9 UNIT/ML-% IJ SOLN
INTRAMUSCULAR | Status: AC | PRN
  Administered 2016-12-04: 1000 mL

## 2016-12-04 MED ORDER — FUROSEMIDE 10 MG/ML IJ SOLN
40.0000 mg | Freq: Two times a day (BID) | INTRAMUSCULAR | Status: DC
  Administered 2016-12-04 – 2016-12-06 (×4): 40 mg via INTRAVENOUS
  Filled 2016-12-04 (×4): qty 4

## 2016-12-04 SURGICAL SUPPLY — 10 items
CATH 5FR JL3.5 JR4 ANG PIG MP (CATHETERS) ×1 IMPLANT
CATH BALLN WEDGE 5F 110CM (CATHETERS) ×1 IMPLANT
GLIDESHEATH SLEND SS 6F .021 (SHEATH) ×1 IMPLANT
GUIDEWIRE INQWIRE 1.5J.035X260 (WIRE) IMPLANT
INQWIRE 1.5J .035X260CM (WIRE) ×2
KIT HEART LEFT (KITS) ×2 IMPLANT
PACK CARDIAC CATHETERIZATION (CUSTOM PROCEDURE TRAY) ×2 IMPLANT
SHEATH GLIDE SLENDER 4/5FR (SHEATH) ×2 IMPLANT
TRANSDUCER W/STOPCOCK (MISCELLANEOUS) ×2 IMPLANT
TUBING CIL FLEX 10 FLL-RA (TUBING) ×2 IMPLANT

## 2016-12-04 NOTE — Progress Notes (Signed)
Progress Note  Patient Name: Stephen Fleming Date of Encounter: 12/04/2016  Primary Cardiologist: New- Dr. Eden Emms (saw in 2004)  Subjective   Pt without dyspnea. He had some mild left lateral chest pressure last night and had orthopnea and was unable to sleep.   Inpatient Medications    Scheduled Meds: . albuterol  2 mg Oral Daily  . aspirin EC  81 mg Oral Daily  . heparin  5,000 Units Subcutaneous Q8H  . lactobacillus acidophilus  1 tablet Oral Daily  . losartan-hydrochlorothiazide  1 tablet Oral Daily  . mometasone-formoterol  2 puff Inhalation BID  . potassium chloride SA  40 mEq Oral BID  . predniSONE  40 mg Oral Daily  . sodium chloride flush  3 mL Intravenous Q12H   Continuous Infusions: . sodium chloride     PRN Meds: sodium chloride, acetaminophen, albuterol, diphenhydrAMINE, ondansetron (ZOFRAN) IV, sodium chloride flush   Vital Signs    Vitals:   12/03/16 2202 12/04/16 0158 12/04/16 0543  BP: 107/87 111/76 107/79  Pulse: (!) 105 95 99  Resp: 20 20 18   Temp: 97.7 F (36.5 C) 98.5 F (36.9 C) 97.6 F (36.4 C)  TempSrc: Oral Oral Oral  SpO2: 96% 97% 97%  Weight: 189 lb 11.2 oz (86 kg)  184 lb 4.8 oz (83.6 kg)  Height: 5\' 10"  (1.778 m)      Intake/Output Summary (Last 24 hours) at 12/04/2016 0725 Last data filed at 12/04/2016 0543 Gross per 24 hour  Intake 120 ml  Output 2375 ml  Net -2255 ml   Filed Weights   12/03/16 2202 12/04/16 0543  Weight: 189 lb 11.2 oz (86 kg) 184 lb 4.8 oz (83.6 kg)    Telemetry    SR/ST around 100 with PVC's - Personally Reviewed  ECG    Sinus tach with PVC, 101 bpm, LBBB - Personally Reviewed  Physical Exam   GEN: No acute distress.   Neck: No JVD Cardiac: Irregular rhythm, 2/6 apical murmur, no rubs, or gallops. PMI mildly displaced to the left.  Respiratory: Clear to auscultation bilaterally. GI: Soft, nontender, non-distended  MS: No edema; No deformity. Neuro:  Nonfocal  Psych: Normal affect   Labs      Chemistry Recent Labs  Lab 12/04/16 0030  NA 136  K 3.8  CL 103  CO2 25  GLUCOSE 112*  BUN 16  CREATININE 1.02  CALCIUM 9.0  GFRNONAA >60  GFRAA >60  ANIONGAP 8     Hematology Recent Labs  Lab 12/04/16 0030  WBC 9.0  RBC 4.50  HGB 15.2  HCT 44.2  MCV 98.2  MCH 33.8  MCHC 34.4  RDW 13.1  PLT 207    Cardiac EnzymesNo results for input(s): TROPONINI in the last 168 hours. No results for input(s): TROPIPOC in the last 168 hours.   BNP Recent Labs  Lab 12/04/16 0030  BNP 1,027.4*     DDimer No results for input(s): DDIMER in the last 168 hours.   Radiology    No results found.  Cardiac Studies   ECHO from [facility] in shadow chart.   Patient Profile     61 y.o. male with past medical history of HTN presented to [facility] and transferred to [facility] after finding EF 20% on echo.  Pt with presenting symptoms of DOE, bilateral ankle and leg swelling for prior 2 weeks. Per [facility] records EF 20%, dilated, moderate MR, poss AI.   Assessment & Plan    Acute on chronic CHF:  [facility] no previous history. BNP 1027.4. CXR without edema. Normal renal function. Plan for cath to evaluate ischemic cause, R&L. Received one dose lasix 20 mg early this am with 2.3L UOP. Wt down 5 lbs. Will hold ARB and diuretics for cath. Pt needs to be on BB, will need carvedilol 3.125 mg bid once stable. Will check lipids and hgb A1c for risk stratification.   Hypertension: On hyzaar at home. BP well controlled. Will hold ARB for cath today and plan to restart tomorrow.   Mitral regurgitation: Moderate per echo, also possible AI. Will evaluate with R&L heart cath.   Asthma: Pt reports well controlled on advair and rescue inhaler.   For questions or updates, please contact [facility] Please consult [email or web address] for contact info under Cardiology/STEMI.      Signed, Berton BonJanine Regine Christian, NP  12/04/2016, 7:25 AM

## 2016-12-04 NOTE — Progress Notes (Signed)
  Echocardiogram 2D Echocardiogram has been performed.  Stephen Fleming F 12/04/2016, 2:02 PM

## 2016-12-04 NOTE — Progress Notes (Signed)
Patient AAO x 4. refused bed alarm. Will continue to monitor patient.

## 2016-12-04 NOTE — H&P (View-Only) (Signed)
Progress Note  Patient Name: Stephen Fleming Date of Encounter: 12/04/2016  Primary Cardiologist: New- Dr. Eden Emms (saw in 2004)  Subjective   Pt without dyspnea. He had some mild left lateral chest pressure last night and had orthopnea and was unable to sleep.   Inpatient Medications    Scheduled Meds: . albuterol  2 mg Oral Daily  . aspirin EC  81 mg Oral Daily  . heparin  5,000 Units Subcutaneous Q8H  . lactobacillus acidophilus  1 tablet Oral Daily  . losartan-hydrochlorothiazide  1 tablet Oral Daily  . mometasone-formoterol  2 puff Inhalation BID  . potassium chloride SA  40 mEq Oral BID  . predniSONE  40 mg Oral Daily  . sodium chloride flush  3 mL Intravenous Q12H   Continuous Infusions: . sodium chloride     PRN Meds: sodium chloride, acetaminophen, albuterol, diphenhydrAMINE, ondansetron (ZOFRAN) IV, sodium chloride flush   Vital Signs    Vitals:   12/03/16 2202 12/04/16 0158 12/04/16 0543  BP: 107/87 111/76 107/79  Pulse: (!) 105 95 99  Resp: 20 20 18   Temp: 97.7 F (36.5 C) 98.5 F (36.9 C) 97.6 F (36.4 C)  TempSrc: Oral Oral Oral  SpO2: 96% 97% 97%  Weight: 189 lb 11.2 oz (86 kg)  184 lb 4.8 oz (83.6 kg)  Height: 5\' 10"  (1.778 m)      Intake/Output Summary (Last 24 hours) at 12/04/2016 0725 Last data filed at 12/04/2016 0543 Gross per 24 hour  Intake 120 ml  Output 2375 ml  Net -2255 ml   Filed Weights   12/03/16 2202 12/04/16 0543  Weight: 189 lb 11.2 oz (86 kg) 184 lb 4.8 oz (83.6 kg)    Telemetry    SR/ST around 100 with PVC's - Personally Reviewed  ECG    Sinus tach with PVC, 101 bpm, LBBB - Personally Reviewed  Physical Exam   GEN: No acute distress.   Neck: No JVD Cardiac: Irregular rhythm, 2/6 apical murmur, no rubs, or gallops. PMI mildly displaced to the left.  Respiratory: Clear to auscultation bilaterally. GI: Soft, nontender, non-distended  MS: No edema; No deformity. Neuro:  Nonfocal  Psych: Normal affect   Labs      Chemistry Recent Labs  Lab 12/04/16 0030  NA 136  K 3.8  CL 103  CO2 25  GLUCOSE 112*  BUN 16  CREATININE 1.02  CALCIUM 9.0  GFRNONAA >60  GFRAA >60  ANIONGAP 8     Hematology Recent Labs  Lab 12/04/16 0030  WBC 9.0  RBC 4.50  HGB 15.2  HCT 44.2  MCV 98.2  MCH 33.8  MCHC 34.4  RDW 13.1  PLT 207    Cardiac EnzymesNo results for input(s): TROPONINI in the last 168 hours. No results for input(s): TROPIPOC in the last 168 hours.   BNP Recent Labs  Lab 12/04/16 0030  BNP 1,027.4*     DDimer No results for input(s): DDIMER in the last 168 hours.   Radiology    No results found.  Cardiac Studies   ECHO from [facility] in shadow chart.   Patient Profile     61 y.o. male with past medical history of HTN presented to [facility] and transferred to [facility] after finding EF 20% on echo.  Pt with presenting symptoms of DOE, bilateral ankle and leg swelling for prior 2 weeks. Per [facility] records EF 20%, dilated, moderate MR, poss AI.   Assessment & Plan    Acute on chronic CHF:  Echo at [facility] showed EF 20%, no previous history. BNP 1027.4. CXR without edema. Normal renal function. Plan for cath to evaluate ischemic cause, R&L. Received one dose lasix 20 mg early this am with 2.3L UOP. Wt down 5 lbs. Will hold ARB and diuretics for cath. Pt needs to be on BB, will need carvedilol 3.125 mg bid once stable. Will check lipids and hgb A1c for risk stratification.   Hypertension: On hyzaar at home. BP well controlled. Will hold ARB for cath today and plan to restart tomorrow.   Mitral regurgitation: Moderate per echo, also possible AI. Will evaluate with R&L heart cath.   Asthma: Pt reports well controlled on advair and rescue inhaler.   For questions or updates, please contact [facility] Please consult [email or web address] for contact info under Cardiology/STEMI.      Signed, Benjimen Kelley, NP  12/04/2016, 7:25 AM    

## 2016-12-04 NOTE — Interval H&P Note (Signed)
History and Physical Interval Note:  12/04/2016 3:10 PM  Stephen Fleming  has presented today for surgery, with the diagnosis of chf  The various methods of treatment have been discussed with the patient and family. After consideration of risks, benefits and other options for treatment, the patient has consented to  Procedure(s): RIGHT/LEFT HEART CATH AND CORONARY ANGIOGRAPHY (N/A) as a surgical intervention .  The patient's history has been reviewed, patient examined, no change in status, stable for surgery.  I have reviewed the patient's chart and labs.  Questions were answered to the patient's satisfaction.   Cath Lab Visit (complete for each Cath Lab visit)  Clinical Evaluation Leading to the Procedure:   ACS: No.  Non-ACS:    Anginal Classification: CCS II  Anti-ischemic medical therapy: Minimal Therapy (1 class of medications)  Non-Invasive Test Results: No non-invasive testing performed  Prior CABG: No previous CABG        Theron Arista [facility] 12/04/2016 3:11 PM

## 2016-12-05 ENCOUNTER — Telehealth: Payer: Self-pay | Admitting: Physician Assistant

## 2016-12-05 ENCOUNTER — Encounter (HOSPITAL_COMMUNITY): Payer: Self-pay | Admitting: Cardiology

## 2016-12-05 LAB — BASIC METABOLIC PANEL
Anion gap: 10 (ref 5–15)
BUN: 16 mg/dL (ref 6–20)
CHLORIDE: 106 mmol/L (ref 101–111)
CO2: 23 mmol/L (ref 22–32)
CREATININE: 0.88 mg/dL (ref 0.61–1.24)
Calcium: 9.2 mg/dL (ref 8.9–10.3)
GFR calc non Af Amer: >60 mL/min (ref >60)
GLUCOSE: 112 mg/dL — AB (ref 65–99)
Potassium: 3.9 mmol/L (ref 3.5–5.1)
Sodium: 139 mmol/L (ref 135–145)

## 2016-12-05 LAB — LIPID PANEL
Cholesterol: 151 mg/dL (ref 0–200)
HDL: 36 mg/dL — AB (ref >40)
LDL Cholesterol: 101 mg/dL — ABNORMAL HIGH (ref 0–99)
Total CHOL/HDL Ratio: 4.2 RATIO
Triglycerides: 69 mg/dL (ref <150)
VLDL: 14 mg/dL (ref 0–40)

## 2016-12-05 LAB — HEMOGLOBIN A1C
Hgb A1c MFr Bld: 5.4 % (ref 4.8–5.6)
MEAN PLASMA GLUCOSE: 108.28 mg/dL

## 2016-12-05 MED ORDER — LOSARTAN POTASSIUM 50 MG PO TABS
100.0000 mg | ORAL_TABLET | Freq: Every day | ORAL | Status: DC
  Administered 2016-12-05 – 2016-12-06 (×2): 100 mg via ORAL
  Filled 2016-12-05 (×2): qty 2

## 2016-12-05 MED ORDER — CARVEDILOL 6.25 MG PO TABS
6.2500 mg | ORAL_TABLET | Freq: Two times a day (BID) | ORAL | Status: DC
  Administered 2016-12-05 – 2016-12-06 (×2): 6.25 mg via ORAL
  Filled 2016-12-05 (×2): qty 1

## 2016-12-05 MED FILL — Verapamil HCl IV Soln 2.5 MG/ML: INTRAVENOUS | Qty: 2 | Status: AC

## 2016-12-05 NOTE — Progress Notes (Signed)
Progress Note  Patient Name: Stephen MallickRicky Prophete Date of Encounter: 12/05/2016  Primary Cardiologist: New- Dr. Eden EmmsNishan (saw in 2004)  Subjective   Feels well seems to deny that ETOH may be cause of DCM [place] [employer] [place]    Inpatient Medications    Scheduled Meds: . albuterol  2 mg Oral Daily  . aspirin EC  81 mg Oral Daily  . furosemide  40 mg Intravenous Q12H  . heparin  5,000 Units Subcutaneous Q8H  . lactobacillus acidophilus  1 tablet Oral Daily  . mometasone-formoterol  2 puff Inhalation BID  . potassium chloride SA  40 mEq Oral BID  . sodium chloride flush  3 mL Intravenous Q12H   Continuous Infusions: . sodium chloride     PRN Meds: sodium chloride, acetaminophen, albuterol, diphenhydrAMINE, ondansetron (ZOFRAN) IV, sodium chloride flush   Vital Signs    Vitals:   12/04/16 1730 12/04/16 1948 12/05/16 0022 12/05/16 0529  BP: 103/81 100/62 96/74 105/76  Pulse: (!) 103 (!) 102 (!) 102 (!) 107  Resp:  20 18 18   Temp:  (!) 97.3 F (36.3 C) (!) 97.3 F (36.3 C) (!) 97.5 F (36.4 C)  TempSrc:  Oral Oral Oral  SpO2:  96% 98% 98%  Weight:    181 lb 1.6 oz (82.1 kg)  Height:        Intake/Output Summary (Last 24 hours) at 12/05/2016 16100822 Last data filed at 12/05/2016 0529 Gross per 24 hour  Intake 720 ml  Output 2025 ml  Net -1305 ml   Filed Weights   12/03/16 2202 12/04/16 0543 12/05/16 0529  Weight: 189 lb 11.2 oz (86 kg) 184 lb 4.8 oz (83.6 kg) 181 lb 1.6 oz (82.1 kg)    Telemetry    SR/ST around 100 with PVC's - Personally Reviewed  ECG    Sinus tach with PVC, 101 bpm, LBBB - Personally Reviewed  Physical Exam   GEN: No acute distress.   Neck: No JVD Cardiac: Irregular rhythm, 2/6 apical murmur, no rubs, or gallops. PMI mildly displaced to the left.  Respiratory: Clear to auscultation bilaterally. GI: Soft, nontender, non-distended  MS: No edema; No deformity. Neuro:  Nonfocal  Psych: Normal affect    Labs    Chemistry Recent Labs  Lab 12/04/16 0030 12/04/16 1705 12/05/16 0545  NA 136  --  139  K 3.8  --  3.9  CL 103  --  106  CO2 25  --  23  GLUCOSE 112*  --  112*  BUN 16  --  16  CREATININE 1.02 0.96 0.88  CALCIUM 9.0  --  9.2  GFRNONAA >60 >60 >60  GFRAA >60 >60 >60  ANIONGAP 8  --  10     Hematology Recent Labs  Lab 12/04/16 0030 12/04/16 1705  WBC 9.0 9.8  RBC 4.50 4.71  HGB 15.2 15.7  HCT 44.2 46.5  MCV 98.2 98.7  MCH 33.8 33.3  MCHC 34.4 33.8  RDW 13.1 13.0  PLT 207 219    Cardiac EnzymesNo results for input(s): TROPONINI in the last 168 hours. No results for input(s): TROPIPOC in the last 168 hours.   BNP Recent Labs  Lab 12/04/16 0030  BNP 1,027.4*     DDimer No results for input(s): DDIMER in the last 168 hours.   Radiology    No results found.  Cardiac Studies   [facility].   Patient Profile  61 y.o. male with past medical history of HTN presented to [facility] and transferred to [facility] after finding EF 20% on echo.  Pt with presenting symptoms of DOE, bilateral ankle and leg swelling for prior 2 weeks. Per [facility] records EF 20%, dilated, moderate MR, poss AI.   Assessment & Plan    Acute on chronic CHF: Echo at [facility] showed EF 20%, no previous history. BNP 1027.4. CXR without edema. Normal renal function. Cath moderate CAD likely issue is ETOH Add ARB and coreg today continue lasix Plan d/c home in am Discussed need to stop Drinking   Hypertension: Resume ARB   Mitral regurgitation: Moderate per echo f/u echo 3 months on medication for CHF   Asthma: Pt reports well controlled on advair and rescue inhaler.   For questions or updates, please contact [facility] Please consult [email or web address] for contact info under Cardiology/STEMI.      Signed, Charlton Haws, MD  12/05/2016, 8:22 AM

## 2016-12-05 NOTE — Telephone Encounter (Signed)
TOC dsch from [facility] 12/06/16 Schedule to see Herma Carson in [facility] Dec 18, 2016

## 2016-12-05 NOTE — Progress Notes (Signed)
Cardiologist on call was notified about patient's low BP. He recommends that we monitor him since he is asymptomatic at this time. Pt is alert and oriented. Denies any dizziness or discomfort. Will continue to monitor.

## 2016-12-05 NOTE — Telephone Encounter (Signed)
Attempt to reach at home number, rings to fast busy signal-cc

## 2016-12-05 NOTE — Care Management Note (Signed)
Case Management Note  Patient Details  Name: Stephen Fleming MRN: [id number] Date of Birth: 12-01-1955  Subjective/Objective:  CHF                 Action/Plan: Patient is independent of his ADL; works at [employer]; has Media planner with [employer] with prescription drug coverage; CM following for DCP  Expected Discharge Date:     Possibly 12/08/2016             Expected Discharge Plan:  Home/Self Care  Discharge planning Services  CM Consult  Status of Service:  In process, will continue to follow  Reola Mosher [phone] 12/05/2016, 11:12 AM

## 2016-12-05 NOTE — Plan of Care (Signed)
  Completed/Met Education: Knowledge of General Education information will improve 12/05/2016 0739 - Completed/Met by Evert Kohl, RN Health Behavior/Discharge Planning: Ability to manage health-related needs will improve 12/05/2016 0739 - Completed/Met by Evert Kohl, RN Clinical Measurements: Ability to maintain clinical measurements within normal limits will improve 12/05/2016 0739 - Completed/Met by Evert Kohl, RN Will remain free from infection 12/05/2016 0739 - Completed/Met by Evert Kohl, RN Diagnostic test results will improve 12/05/2016 0739 - Completed/Met by Evert Kohl, RN Respiratory complications will improve 12/05/2016 0739 - Completed/Met by Evert Kohl, RN Cardiovascular complication will be avoided 12/05/2016 0739 - Completed/Met by Evert Kohl, RN Activity: Risk for activity intolerance will decrease 12/05/2016 0739 - Completed/Met by Evert Kohl, RN Nutrition: Adequate nutrition will be maintained 12/05/2016 0739 - Completed/Met by Evert Kohl, RN Coping: Level of anxiety will decrease 12/05/2016 0739 - Completed/Met by Evert Kohl, RN Elimination: Will not experience complications related to bowel motility 12/05/2016 0739 - Completed/Met by Evert Kohl, RN Will not experience complications related to urinary retention 12/05/2016 0739 - Completed/Met by Evert Kohl, RN Pain Managment: General experience of comfort will improve 12/05/2016 0739 - Completed/Met by Evert Kohl, RN Safety: Ability to remain free from injury will improve 12/05/2016 0739 - Completed/Met by Evert Kohl, RN Skin Integrity: Risk for impaired skin integrity will decrease 12/05/2016 0739 - Completed/Met by Evert Kohl, RN Education: Ability to demonstrate management of  disease process will improve 12/05/2016 0739 - Completed/Met by Evert Kohl, RN Ability to verbalize understanding of medication therapies will improve 12/05/2016 0739 - Completed/Met by Evert Kohl, RN Activity: Capacity to carry out activities will improve 12/05/2016 0739 - Completed/Met by Evert Kohl, RN Cardiac: Ability to achieve and maintain adequate cardiopulmonary perfusion will improve 12/05/2016 0739 - Completed/Met by Evert Kohl, RN

## 2016-12-06 ENCOUNTER — Encounter (HOSPITAL_COMMUNITY): Payer: Self-pay | Admitting: Cardiology

## 2016-12-06 DIAGNOSIS — I340 Nonrheumatic mitral (valve) insufficiency: Secondary | ICD-10-CM

## 2016-12-06 DIAGNOSIS — I2510 Atherosclerotic heart disease of native coronary artery without angina pectoris: Secondary | ICD-10-CM

## 2016-12-06 LAB — BASIC METABOLIC PANEL
Anion gap: 9 (ref 5–15)
BUN: 26 mg/dL — ABNORMAL HIGH (ref 6–20)
CHLORIDE: 106 mmol/L (ref 101–111)
CO2: 23 mmol/L (ref 22–32)
CREATININE: 0.97 mg/dL (ref 0.61–1.24)
Calcium: 9.2 mg/dL (ref 8.9–10.3)
GFR calc non Af Amer: >60 mL/min (ref >60)
GLUCOSE: 116 mg/dL — AB (ref 65–99)
Potassium: 4 mmol/L (ref 3.5–5.1)
Sodium: 138 mmol/L (ref 135–145)

## 2016-12-06 MED ORDER — BACID PO TABS: 1.0000 | ORAL_TABLET | Freq: Every day | ORAL | Status: DC

## 2016-12-06 MED ORDER — FUROSEMIDE 40 MG PO TABS
40.0000 mg | ORAL_TABLET | Freq: Every day | ORAL | Status: DC
  Administered 2016-12-06: 40 mg via ORAL
  Filled 2016-12-06: qty 1

## 2016-12-06 MED ORDER — POTASSIUM CHLORIDE CRYS ER 20 MEQ PO TBCR: 20.0000 meq | EXTENDED_RELEASE_TABLET | Freq: Every day | ORAL | 5 refills | Status: DC

## 2016-12-06 MED ORDER — LOSARTAN POTASSIUM 100 MG PO TABS: 100.0000 mg | ORAL_TABLET | Freq: Every day | ORAL | 5 refills | Status: DC

## 2016-12-06 MED ORDER — CARVEDILOL 6.25 MG PO TABS: 6.2500 mg | ORAL_TABLET | Freq: Two times a day (BID) | ORAL | 5 refills | Status: DC

## 2016-12-06 MED ORDER — FUROSEMIDE 40 MG PO TABS: 40.0000 mg | ORAL_TABLET | Freq: Every day | ORAL | 5 refills | Status: DC

## 2016-12-06 MED ORDER — POTASSIUM CHLORIDE CRYS ER 20 MEQ PO TBCR
20.0000 meq | EXTENDED_RELEASE_TABLET | Freq: Every day | ORAL | Status: DC
  Administered 2016-12-06: 20 meq via ORAL
  Filled 2016-12-06: qty 1

## 2016-12-06 NOTE — Discharge Instructions (Signed)
Work on stopping drinking alcohol as this can cause heart problems like you have.

## 2016-12-06 NOTE — Discharge Summary (Signed)
Discharge Summary    Patient ID: Stephen Fleming,  MRN: [id number], DOB/AGE: 62-Nov-1957 62 y.o.  Admit date: 12/03/2016 Discharge date: 12/06/2016  Primary Care Provider: Louie Boston. Primary Cardiologist: Dr. Eden Emms (Will follow up in [place] or [place])  Discharge Diagnoses    Principal Problem:   CHF (congestive heart failure), NYHA class III, acute on chronic, combined (HCC) Active Problems:   Dyspnea   Systolic CHF with reduced left ventricular function, NYHA class 3 (HCC)   Essential (primary) hypertension   Allergies No Known Allergies  Diagnostic Studies/Procedures    Left and Right heart catheterization 12/04/16 Conclusion    Prox RCA to Mid RCA lesion is 30% stenosed.  RPDA lesion is 50% stenosed.  Mid LAD lesion is 25% stenosed.  Prox LAD lesion is 20% stenosed.  Ost 1st Diag lesion is 60% stenosed.  Ost 1st Mrg lesion is 95% stenosed.  Ost 2nd Mrg lesion is 70% stenosed.  Hemodynamic findings consistent with moderate pulmonary hypertension.   1. CAD with 60% first diagonal, 95% ostial small OM1, 70% OM2, and 50% mid PDA 2. Moderate pulmonary HTN with mean PAP 37 mm Hg 3. Moderately elevated LV filling pressures with PCWP of 29 mm Hg 4. Reduced CO with index 1.65.   Plan: CAD is not severe enough to explain his LV dysfunction. He needs aggressive medical management for CHF.    Diagnostic Diagram     _____________  Echocardiogram 12/04/16 Study Conclusions  - Left ventricle: The cavity size was moderately dilated. Systolic   function was severely reduced. The estimated ejection fraction   was in the range of 20% to 25%. Doppler parameters are consistent   with a reversible restrictive pattern, indicative of decreased   left ventricular diastolic compliance and/or increased left   atrial pressure (grade 3 diastolic dysfunction). - Aortic valve: Valve area (VTI): 2.62 cm^2. Valve area (Vmax):   2.81 cm^2. Valve area (Vmean): 2.55  cm^2. - Mitral valve: There was moderate regurgitation. - Left atrium: The atrium was mildly dilated. - Right atrium: The atrium was mildly to moderately dilated. - Pulmonary arteries: Systolic pressure was moderately to severely   increased. PA peak pressure: 62 mm Hg (S).   History of Present Illness     Stephen Fleming is a 61 year old male with past medical history significant for hypertension who presented tot he [facility] ED for evaluation of worsening bilateral ankle and leg swelling and dyspnea on exertion. BNP was 1027.4. He was found to have severe LV dysfunction on echo and transferred to [facility] for further evaluation.    Hospital Course     Consultants: None  The patient was taken to the cath lab on 12/04/16 for left and right heart cath and found to have CAD as above not severe enough to explain his LV dysfunction. Mean PAP was 37 mmHg indicating moderate pulmonary hypertension. He had moderated elevated LV filling pressures with PCPW of 29 mm HG and reduced CO with index 1.65. Echocardiogram showed EF 20-25% with grade 3 DD, moderate MR and moderate to severely increased PA peak pressure, 62 mmHg. Aggressive medical management for CHF is recommended. The patient has a significant alcohol intake history which is likely a causative factor in his CAD. He has been counseled to stop drinking.   ARB and Coreg have been added. His hypertension improved. He was diuresed with lasix 40 mg IV with weight down 9 pounds, discharge wt 180 lbs. He will be discharge  home on lasix 40 mg daily.   Plan to follow mitral regurgitation with echo after 3 months of treatment for CHF.   Creatinine has remained stable after cath, 0.97 at discharge.   Mr. Stephen Fleming works in [place] [employer] and wants to follow up in [place].  Patient has been seen by Dr. Eden EmmsNishan today and deemed ready for discharge home. All follow up appointments have been scheduled. Discharge medications are listed  below.  _____________  Discharge Vitals Blood pressure 93/78, pulse (!) 101, temperature 97.6 F (36.4 C), temperature source Oral, resp. rate 18, height 5\' 10"  (1.778 m), weight 180 lb 12.8 oz (82 kg), SpO2 100 %.  Filed Weights   12/04/16 0543 12/05/16 0529 12/06/16 0528  Weight: 184 lb 4.8 oz (83.6 kg) 181 lb 1.6 oz (82.1 kg) 180 lb 12.8 oz (82 kg)    Labs & Radiologic Studies    CBC Recent Labs    12/04/16 0030 12/04/16 1705  WBC 9.0 9.8  HGB 15.2 15.7  HCT 44.2 46.5  MCV 98.2 98.7  PLT 207 219   Basic Metabolic Panel Recent Labs    11/91/4711/15/18 0545 12/06/16 0346  NA 139 138  K 3.9 4.0  CL 106 106  CO2 23 23  GLUCOSE 112* 116*  BUN 16 26*  CREATININE 0.88 0.97  CALCIUM 9.2 9.2   Liver Function Tests No results for input(s): AST, ALT, ALKPHOS, BILITOT, PROT, ALBUMIN in the last 72 hours. No results for input(s): LIPASE, AMYLASE in the last 72 hours. Cardiac Enzymes No results for input(s): CKTOTAL, CKMB, CKMBINDEX, TROPONINI in the last 72 hours. BNP Invalid input(s): POCBNP D-Dimer No results for input(s): DDIMER in the last 72 hours. Hemoglobin A1C Recent Labs    12/05/16 0545  HGBA1C 5.4   Fasting Lipid Panel Recent Labs    12/05/16 0545  CHOL 151  HDL 36*  LDLCALC 101*  TRIG 69  CHOLHDL 4.2   Thyroid Function Tests No results for input(s): TSH, T4TOTAL, T3FREE, THYROIDAB in the last 72 hours.  Invalid input(s): FREET3 _____________  No results found. Disposition   Pt is being discharged home today in good condition.  Follow-up Plans & Appointments    Follow-up Information    Dyann KiefLenze, Michele M, PA-C Follow up on 12/18/2016.   Specialty:  Cardiology Why:  at 1:00PM --the next appt will be in [place] but none available for the first follow up--please remind Elon JesterMichele to schedule you next in [place]. Contact information: [address] [place] [address] [phone]          Discharge Instructions    (HEART FAILURE PATIENTS) Call MD:   Anytime you have any of the following symptoms: 1) 3 pound weight gain in 24 hours or 5 pounds in 1 week 2) shortness of breath, with or without a dry hacking cough 3) swelling in the hands, feet or stomach 4) if you have to sleep on extra pillows at night in order to breathe.   Complete by:  As directed    Diet - low sodium heart healthy   Complete by:  As directed    Discharge instructions   Complete by:  As directed    PLEASE REMEMBER TO BRING ALL OF YOUR MEDICATIONS TO EACH OF YOUR FOLLOW-UP OFFICE VISITS.  PLEASE ATTEND ALL SCHEDULED FOLLOW-UP APPOINTMENTS.   Activity: Increase activity slowly as tolerated. You may shower, but no soaking baths (or swimming) for 1 week. No driving for 24 hours. No lifting over 5 lbs  for 1 week. No sexual activity for 1 week.   You May Return to Work: in 1 week (if applicable)  Wound Care: You may wash cath site gently with soap and water. Keep cath site clean and dry. If you notice pain, swelling, bleeding or pus at your cath site, please call [phone].   Increase activity slowly   Complete by:  As directed       Discharge Medications   Current Discharge Medication List    START taking these medications   Details  carvedilol (COREG) 6.25 MG tablet Take 1 tablet (6.25 mg total) 2 (two) times daily with a meal by mouth. Qty: 30 tablet, Refills: 5    furosemide (LASIX) 40 MG tablet Take 1 tablet (40 mg total) daily by mouth. Qty: 30 tablet, Refills: 5    losartan (COZAAR) 100 MG tablet Take 1 tablet (100 mg total) daily by mouth. Qty: 30 tablet, Refills: 5      CONTINUE these medications which have CHANGED   Details  lactobacillus acidophilus (BACID) TABS tablet Take 1 tablet daily by mouth. As previously prescribed.    potassium chloride SA (K-DUR,KLOR-CON) 20 MEQ tablet Take 1 tablet (20 mEq total) daily by mouth. Qty: 30 tablet, Refills: 5      CONTINUE these medications which have NOT CHANGED   Details  albuterol (PROVENTIL  HFA;VENTOLIN HFA) 108 (90 BASE) MCG/ACT inhaler Inhale 2 puffs into the lungs every 6 (six) hours as needed for wheezing.    allopurinol (ZYLOPRIM) 300 MG tablet Take 300 mg daily by mouth.    aspirin 81 MG tablet Take 81 mg by mouth daily.    benzonatate (TESSALON) 200 MG capsule Take 200 mg 3 (three) times daily as needed by mouth for cough. Refills: 0    colchicine 0.6 MG tablet Take 0.6 mg as needed by mouth. Gout flare-up    diphenhydrAMINE (BENADRYL) 25 MG tablet Take 50 mg by mouth once as needed for itching or allergies.    Fluticasone-Salmeterol (ADVAIR) 250-50 MCG/DOSE AEPB Inhale 1 puff into the lungs every 12 (twelve) hours.    Multiple Vitamin (MULTIVITAMIN WITH MINERALS) TABS Take 1 tablet by mouth daily.    polyethylene glycol powder (GLYCOLAX/MIRALAX) powder Take 17 g daily by mouth.      STOP taking these medications     losartan-hydrochlorothiazide (HYZAAR) 100-25 MG tablet      ALBUTEROL SULFATE PO            Outstanding Labs/Studies   None  Duration of Discharge Encounter   Greater than 30 minutes including physician time.  Signed, Berton Bon NP 12/06/2016, 9:58 AM

## 2016-12-06 NOTE — Progress Notes (Signed)
Reviewed all instructions with patient and he stated understanding.  He stated he has clothing, cell phone, and glasses that he brought to hospital.  No voiced complaints.

## 2016-12-06 NOTE — Plan of Care (Signed)
All reviewed.

## 2016-12-06 NOTE — Progress Notes (Signed)
Progress Note  Patient Name: Stephen Fleming Date of Encounter: 12/06/2016  Primary Cardiologist: New- Dr. Eden Emms (saw in 2004)  Subjective   Feels well seems to deny that ETOH may be cause of DCM Works in [employer] wants to f/u in [place]    Inpatient Medications    Scheduled Meds: . albuterol  2 mg Oral Daily  . aspirin EC  81 mg Oral Daily  . carvedilol  6.25 mg Oral BID WC  . furosemide  40 mg Intravenous Q12H  . heparin  5,000 Units Subcutaneous Q8H  . lactobacillus acidophilus  1 tablet Oral Daily  . losartan  100 mg Oral Daily  . mometasone-formoterol  2 puff Inhalation BID  . potassium chloride SA  40 mEq Oral BID  . sodium chloride flush  3 mL Intravenous Q12H   Continuous Infusions: . sodium chloride     PRN Meds: sodium chloride, acetaminophen, albuterol, diphenhydrAMINE, ondansetron (ZOFRAN) IV, sodium chloride flush   Vital Signs    Vitals:   12/06/16 0037 12/06/16 0039 12/06/16 0528 12/06/16 0800  BP: (!) 88/67 (!) 89/64 102/85 102/86  Pulse: 91 91 (!) 101   Resp:   18 16  Temp:   (!) 97.5 F (36.4 C) (!) 97.3 F (36.3 C)  TempSrc:   Oral Oral  SpO2:   97% 100%  Weight:   180 lb 12.8 oz (82 kg)   Height:        Intake/Output Summary (Last 24 hours) at 12/06/2016 0835 Last data filed at 12/06/2016 0529 Gross per 24 hour  Intake 1284 ml  Output 2050 ml  Net -766 ml   Filed Weights   12/04/16 0543 12/05/16 0529 12/06/16 0528  Weight: 184 lb 4.8 oz (83.6 kg) 181 lb 1.6 oz (82.1 kg) 180 lb 12.8 oz (82 kg)    Telemetry    SR/ST around 100 with PVC's - Personally Reviewed  ECG    Sinus tach with PVC, 101 bpm, LBBB - Personally Reviewed  Physical Exam   GEN: No acute distress.   Neck: No JVD Cardiac: Irregular rhythm, 2/6 apical murmur, no rubs, or gallops. PMI mildly displaced to the left.  Respiratory: Clear to auscultation bilaterally. GI: Soft, nontender, non-distended  MS: No edema; No deformity. Neuro:  Nonfocal   Psych: Normal affect   Labs    Chemistry Recent Labs  Lab 12/04/16 0030 12/04/16 1705 12/05/16 0545 12/06/16 0346  NA 136  --  139 138  K 3.8  --  3.9 4.0  CL 103  --  106 106  CO2 25  --  23 23  GLUCOSE 112*  --  112* 116*  BUN 16  --  16 26*  CREATININE 1.02 0.96 0.88 0.97  CALCIUM 9.0  --  9.2 9.2  GFRNONAA >60 >60 >60 >60  GFRAA >60 >60 >60 >60  ANIONGAP 8  --  10 9     Hematology Recent Labs  Lab 12/04/16 0030 12/04/16 1705  WBC 9.0 9.8  RBC 4.50 4.71  HGB 15.2 15.7  HCT 44.2 46.5  MCV 98.2 98.7  MCH 33.8 33.3  MCHC 34.4 33.8  RDW 13.1 13.0  PLT 207 219    Cardiac EnzymesNo results for input(s): TROPONINI in the last 168 hours. No results for input(s): TROPIPOC in the last 168 hours.   BNP Recent Labs  Lab 12/04/16 0030  BNP 1,027.4*     DDimer No results for input(s): DDIMER in the last 168 hours.  Radiology    No results found.  Cardiac Studies   [facility] shadow [facility].   Patient Profile     61 y.o. male with past medical history of HTN [facility] after finding EF 20% on echo.  Pt with presenting symptoms of DOE, bilateral ankle and leg swelling for prior 2 weeks. [facility] EF 20%, dilated, moderate MR, poss AI.   Assessment & Plan    Acute on chronic CHF: [facility] showed EF 20%, no previous history. BNP 1027.4. CXR without edema. Normal renal function. Cath moderate CAD likely issue is ETOH Continuo titrate ARB,beta blocker and lasix as outpatient  Plan d/c home  Discussed need to stop drinking Outpatient f/[place] in [place]    Hypertension: improved with ARB   Mitral regurgitation: Moderate per echo f/u echo 3 months on medication for CHF   Asthma: Pt reports well controlled on advair and rescue inhaler.   For questions or updates, please contact [facility] Please consult [email or web address] for contact info under Cardiology/STEMI.      Signed, Charlton HawsPeter Nishan, MD  12/06/2016,  8:35 AM

## 2016-12-09 NOTE — Telephone Encounter (Signed)
Attempt to reach, line with fast busy signal again

## 2016-12-17 DIAGNOSIS — F1010 Alcohol abuse, uncomplicated: Secondary | ICD-10-CM | POA: Insufficient documentation

## 2016-12-17 NOTE — Progress Notes (Signed)
Cardiology Office Note    Date:  12/18/2016   ID:  Stephen Fleming Jun 02, 1955, MRN [id number]  PCP:  Stephen Fleming., MD  Cardiologist: Dr. Rhea Fleming office  Chief Complaint  Patient presents with  . Follow-up    History of Present Illness:  Stephen Fleming is a 61 y.o. male with history of hypertension who presented to [facility] with dyspnea on exertion and leg swelling for 2 weeks, BNP of thousand 27.  Echo showed LVEF 20% with moderate MR and possible AI and he was transferred to [facility] for further treatment.  Right and left cardiac cath 12/04/16 with 60% diagonal 1, 95% small ostial OM1, 70% OM 2 and 50% mid PDA, moderate pulmonary hypertension with mean PA P 37 mmHg moderately elevated LV filling pressures.  CAD not severe enough to explain his LV dysfunction. aggressive medical management as of CHF recommended.  2D echo 12/04/16 LVEF 20-25% with grade 3 DD, moderate MR.  Patient does have heavy alcohol intake and was counseled to stop drinking.  ARB and Coreg added with improvement of his hypertension he diuresed down 9 pounds with discharge weight of 180 pounds.  Plan for repeat echo after 3 months of treatment for CHF.  Patient comes in for post hospital f/u.  He is upset about his hospitalization.  He was not given a note to stay out of work and had to go to his primary care.  He is also upset that his wife told the doctors that he drinks a lot of alcohol.  He says he has been drinking for since he was 61 years old and knows that it has not caused his heart problem.  He says he is not feeling much better than when he was in the hospital.  When he was walking outside to pick up something that was blowing away he got short of breath and his heart started racing and he became dizzy.  Once he sat down everything was okay.  He says he just feels funny in his head all the time.  He has not had any alcohol since hospitalization.  Past Medical History:  Diagnosis Date  . Arthritis    "probably in all my joints" (12/03/2016)  . Asthma   . CAD (coronary artery disease)    a. LHC 12/04/16: 30% RCA, 50% RPDA, 25% mid LAD, 95% 1st diag, 70% 2nd diag.  . CHF (congestive heart failure), NYHA class II, chronic, combined (HCC)    12/04/16: Echo EF 20-25%, Gr 3 DD, PA peak pressure 62 mmgHg, RHC: PCPW 29 mmHg  . Eczema   . Gout    "I take RX for it" (12/03/2016)  . Heart murmur   . Hypertension   . Mitral regurgitation    12/04/16: moderate by echo  . OSA (obstructive sleep apnea)    "never could tolerate the mask" (12/03/2016)  . Pulmonary hypertension (HCC)    12/04/16: RHC: Mod PHTN, PAP 37 mmHg    Past Surgical History:  Procedure Laterality Date  . APPENDECTOMY  ~  1967  . BACK SURGERY    . COLONOSCOPY  ~ 2008  . ESOPHAGOGASTRODUODENOSCOPY  ~ 2008  . INGUINAL HERNIA REPAIR Right ~ 1976  . LUMBAR DISC SURGERY  1991; 1993   "Stephen Fleming did them both"  . RIGHT/LEFT HEART CATH AND CORONARY ANGIOGRAPHY N/A 12/04/2016   Procedure: RIGHT/LEFT HEART CATH AND CORONARY ANGIOGRAPHY;  Surgeon: Fleming, Stephen M, MD;  Location: [facility] INVASIVE CV LAB;  Service: Cardiovascular;  Laterality: N/A;    Current Medications: No outpatient medications have been marked as taking for the 12/18/16 encounter (Office Visit) with Stephen Fleming, Stephen Fleming M, PA-C.     Allergies:   Patient has no known allergies.   Social History   Socioeconomic History  . Marital status: Married    Spouse name: Not on file  . Number of children: Not on file  . Years of education: Not on file  . Highest education level: Not on file  Social Needs  . Financial resource strain: Not on file  . Food insecurity - worry: Not on file  . Food insecurity - inability: Not on file  . Transportation needs - medical: Not on file  . Transportation needs - non-medical: Not on file  Occupational History  . Not on file  Tobacco Use  . Smoking status: Never Smoker  . Smokeless tobacco: Never Used  Substance and Sexual  Activity  . Alcohol use: Yes    Alcohol/week: 12.0 oz    Types: 20 Cans of beer per week  . Drug use: No  . Sexual activity: Not Currently  Other Topics Concern  . Not on file  Social History Narrative  . Not on file     Family History:  The patient's family history is not on file.   ROS:   Please see the history of present illness.    Review of Systems  Constitution: Positive for weakness and malaise/fatigue.  HENT: Negative.   Cardiovascular: Positive for dyspnea on exertion and palpitations.  Respiratory: Negative.   Endocrine: Negative.   Hematologic/Lymphatic: Negative.   Musculoskeletal: Negative.   Gastrointestinal: Negative.   Genitourinary: Negative.   Neurological: Positive for dizziness and light-headedness.   All other systems reviewed and are negative.   PHYSICAL EXAM:   VS:  BP 102/60   Pulse 93   Ht 5\' 10"  (1.778 m)   Wt 190 lb (86.2 kg)   SpO2 98%   BMI 27.26 kg/m   Physical Exam  GEN: Well nourished, well developed, in no acute distress  Neck: no JVD, carotid bruits, or masses Cardiac:RRR; positive S4, 1/6 systolic murmur at the apex Respiratory: Decreased breath sounds but clear to auscultation bilaterally, normal work of breathing GI: soft, nontender, nondistended, + BS Ext: without cyanosis, clubbing, or edema, Good distal pulses bilaterally Neuro:  Alert and Oriented x 3 Psych: euthymic mood, full affect  Wt Readings from Last 3 Encounters:  12/18/16 190 lb (86.2 kg)  12/06/16 180 lb 12.8 oz (82 kg)  06/18/15 205 lb (93 kg)      Studies/Labs Reviewed:   EKG:  EKG is not ordered today.   Recent Labs: 12/04/2016: B Natriuretic Peptide 1,027.4; Hemoglobin 15.7; Platelets 219 12/06/2016: BUN 26; Creatinine, Ser 0.97; Potassium 4.0; Sodium 138   Lipid Panel    Component Value Date/Time   CHOL 151 12/05/2016 0545   TRIG 69 12/05/2016 0545   HDL 36 (L) 12/05/2016 0545   CHOLHDL 4.2 12/05/2016 0545   VLDL 14 12/05/2016 0545   LDLCALC  101 (H) 12/05/2016 0545    Additional studies/ records that were reviewed today include:     Echocardiogram 12/04/16 Study Conclusions   - Left ventricle: The cavity size was moderately dilated. Systolic   function was severely reduced. The estimated ejection fraction   was in the range of 20% to 25%. Doppler parameters are consistent   with a reversible restrictive pattern, indicative of decreased   left ventricular diastolic compliance and/or increased left  atrial pressure (grade 3 diastolic dysfunction). - Aortic valve: Valve area (VTI): 2.62 cm^2. Valve area (Vmax):   2.81 cm^2. Valve area (Vmean): 2.55 cm^2. - Mitral valve: There was moderate regurgitation. - Left atrium: The atrium was mildly dilated. - Right atrium: The atrium was mildly to moderately dilated. - Pulmonary arteries: Systolic pressure was moderately to severely   increased. PA peak pressure: 62 mm Hg (S).     Left and Right heart catheterization 12/04/16 Conclusion     Prox RCA to Mid RCA lesion is 30% stenosed.  RPDA lesion is 50% stenosed.  Mid LAD lesion is 25% stenosed.  Prox LAD lesion is 20% stenosed.  Ost 1st Diag lesion is 60% stenosed.  Ost 1st Mrg lesion is 95% stenosed.  Ost 2nd Mrg lesion is 70% stenosed.  Hemodynamic findings consistent with moderate pulmonary hypertension.   1. CAD with 60% first diagonal, 95% ostial small OM1, 70% OM2, and 50% mid PDA 2. Moderate pulmonary HTN with mean PAP 37 mm Hg 3. Moderately elevated LV filling pressures with PCWP of 29 mm Hg 4. Reduced CO with index 1.65.    Plan: CAD is not severe enough to explain his LV dysfunction. He needs aggressive medical management for CHF.      ASSESSMENT:    1. CHF (congestive heart failure), NYHA class III, acute on chronic, combined (HCC)   2. Coronary artery disease involving native coronary artery of native heart without angina pectoris   3. Essential (primary) hypertension   4. Non-rheumatic mitral  regurgitation   5. ETOH abuse      PLAN:  In order of problems listed above:  CHF systolic and diastolic ejection fraction 25% with grade 3 DD and moderate MR. no heart failure on exam today.  Plan for follow-up echo in 3 months.  Patient is complaining of rapid heart rate when he walks in the yard or tries to do anything.  Mildly orthostatic in the office today and blood pressure was low.  Will decrease losartan to 50 mg daily and increase carvedilol to 12.5 mg twice daily.  Follow-up in 1-2 weeks with myself or Dr. Eden Emms  CAD as listed above out of proportion to his severe LV dysfunction.  No angina  Essential hypertension blood pressure is on the low side and he is mildly orthostatic.  We will decrease losartan to 50 mg daily.  Moderate mitral regurgitation follow-up on echo  EtOH says he stopped drinking  Medication Adjustments/Labs and Tests Ordered: Current medicines are reviewed at length with the patient today.  Concerns regarding medicines are outlined above.  Medication changes, Labs and Tests ordered today are listed in the Patient Instructions below. There are no Patient Instructions on file for this visit.   Elson Clan, PA-C  12/18/2016 1:19 PM    [facility] [address] [place], [place], [place]  [address] Phone: [phone]; Fax: [phone]

## 2016-12-18 ENCOUNTER — Encounter: Payer: Self-pay | Admitting: *Deleted

## 2016-12-18 ENCOUNTER — Ambulatory Visit: Payer: BLUE CROSS/BLUE SHIELD | Admitting: Physician Assistant

## 2016-12-18 VITALS — BP 102/60 | HR 93 | Ht 70.0 in | Wt 190.0 lb

## 2016-12-18 DIAGNOSIS — I340 Nonrheumatic mitral (valve) insufficiency: Secondary | ICD-10-CM

## 2016-12-18 DIAGNOSIS — F1010 Alcohol abuse, uncomplicated: Secondary | ICD-10-CM

## 2016-12-18 DIAGNOSIS — I10 Essential (primary) hypertension: Secondary | ICD-10-CM

## 2016-12-18 DIAGNOSIS — I2510 Atherosclerotic heart disease of native coronary artery without angina pectoris: Secondary | ICD-10-CM

## 2016-12-18 DIAGNOSIS — I5043 Acute on chronic combined systolic (congestive) and diastolic (congestive) heart failure: Secondary | ICD-10-CM | POA: Diagnosis not present

## 2016-12-18 MED ORDER — LOSARTAN POTASSIUM 50 MG PO TABS: 50.0000 mg | ORAL_TABLET | Freq: Every day | ORAL | 3 refills | Status: DC

## 2016-12-18 MED ORDER — CARVEDILOL 12.5 MG PO TABS: 12.5000 mg | ORAL_TABLET | Freq: Two times a day (BID) | ORAL | 3 refills | Status: DC

## 2016-12-18 NOTE — Patient Instructions (Signed)
Medication Instructions:  Your physician has recommended you make the following change in your medication:  Decrease Losartan to 50 mg Daily  Increase Coreg to 12.5 mg Two Times Daily    Labwork: NONE  Testing/Procedures: Your physician has requested that you have an echocardiogram in 3 months. Echocardiography is a painless test that uses sound waves to create images of your heart. It provides your doctor with information about the size and shape of your heart and how well your heart's chambers and valves are working. This procedure takes approximately one hour. There are no restrictions for this procedure.    Follow-Up: Your physician recommends that you schedule a follow-up appointment in: 2 Weeks with Dr. Eden Emms.   Any Other Special Instructions Will Be Listed Below (If Applicable).     If you need a refill on your cardiac medications before your next appointment, please call your pharmacy.  Thank you for choosing [facility]!

## 2017-01-01 ENCOUNTER — Ambulatory Visit: Payer: BLUE CROSS/BLUE SHIELD | Admitting: Physician Assistant

## 2017-01-01 ENCOUNTER — Encounter: Payer: Self-pay | Admitting: Physician Assistant

## 2017-01-01 ENCOUNTER — Other Ambulatory Visit (HOSPITAL_COMMUNITY)
Admission: RE | Admit: 2017-01-01 | Discharge: 2017-01-01 | Disposition: A | Discharge status: Home / Self Care | Payer: BLUE CROSS/BLUE SHIELD | Source: Ambulatory Visit | Attending: Physician Assistant | Admitting: Physician Assistant

## 2017-01-01 ENCOUNTER — Encounter: Payer: Self-pay | Admitting: *Deleted

## 2017-01-01 VITALS — BP 98/60 | HR 106 | Ht 70.0 in | Wt 181.0 lb

## 2017-01-01 DIAGNOSIS — R42 Dizziness and giddiness: Secondary | ICD-10-CM | POA: Diagnosis present

## 2017-01-01 DIAGNOSIS — I2510 Atherosclerotic heart disease of native coronary artery without angina pectoris: Secondary | ICD-10-CM | POA: Diagnosis present

## 2017-01-01 DIAGNOSIS — F1010 Alcohol abuse, uncomplicated: Secondary | ICD-10-CM | POA: Insufficient documentation

## 2017-01-01 DIAGNOSIS — I340 Nonrheumatic mitral (valve) insufficiency: Secondary | ICD-10-CM | POA: Diagnosis not present

## 2017-01-01 DIAGNOSIS — I951 Orthostatic hypotension: Secondary | ICD-10-CM

## 2017-01-01 DIAGNOSIS — I5020 Unspecified systolic (congestive) heart failure: Secondary | ICD-10-CM | POA: Diagnosis present

## 2017-01-01 DIAGNOSIS — I10 Essential (primary) hypertension: Secondary | ICD-10-CM

## 2017-01-01 LAB — CBC WITH DIFFERENTIAL/PLATELET
Basophils Absolute: 0 10*3/uL (ref 0.0–0.1)
Basophils Relative: 0 %
EOS PCT: 3 %
Eosinophils Absolute: 0.3 10*3/uL (ref 0.0–0.7)
HEMATOCRIT: 52.3 % — AB (ref 39.0–52.0)
HEMOGLOBIN: 16.7 g/dL (ref 13.0–17.0)
LYMPHS ABS: 3 10*3/uL (ref 0.7–4.0)
LYMPHS PCT: 30 %
MCH: 32.5 pg (ref 26.0–34.0)
MCHC: 31.9 g/dL (ref 30.0–36.0)
MCV: 101.8 fL — AB (ref 78.0–100.0)
Monocytes Absolute: 0.7 10*3/uL (ref 0.1–1.0)
Monocytes Relative: 7 %
NEUTROS ABS: 5.9 10*3/uL (ref 1.7–7.7)
Neutrophils Relative %: 60 %
PLATELETS: 245 10*3/uL (ref 150–400)
RBC: 5.14 MIL/uL (ref 4.22–5.81)
RDW: 12.9 % (ref 11.5–15.5)
WBC: 9.9 10*3/uL (ref 4.0–10.5)

## 2017-01-01 LAB — BASIC METABOLIC PANEL
Anion gap: 10 (ref 5–15)
BUN: 18 mg/dL (ref 6–20)
CHLORIDE: 103 mmol/L (ref 101–111)
CO2: 27 mmol/L (ref 22–32)
Calcium: 10 mg/dL (ref 8.9–10.3)
Creatinine, Ser: 1.12 mg/dL (ref 0.61–1.24)
GFR calc Af Amer: >60 mL/min (ref >60)
GFR calc non Af Amer: >60 mL/min (ref >60)
Glucose, Bld: 121 mg/dL — ABNORMAL HIGH (ref 65–99)
POTASSIUM: 4.3 mmol/L (ref 3.5–5.1)
SODIUM: 140 mmol/L (ref 135–145)

## 2017-01-01 MED ORDER — FUROSEMIDE 20 MG PO TABS: 20.0000 mg | ORAL_TABLET | Freq: Every day | ORAL | 3 refills | Status: DC

## 2017-01-01 NOTE — Progress Notes (Signed)
Cardiology Office Note    Date:  01/01/2017   ID:  Stephen, Fleming January 06, 1956, MRN [id number]  PCP:  Deloria Lair., MD  Cardiologist: Dr. Fidel Levy office  Chief Complaint  Patient presents with  . Follow-up  . Dizziness    History of Present Illness:  Stephen Fleming is a 61 y.o. male with history of hypertension who was admitted to [facility] with CHF and echo LVEF 20% with moderate MR and possible AI.  He underwent right and left heart cath 12/04/16 with 60% diagonal 1, 95% small ostial OM1, 70% OM 2, 50% mid PDA, moderate pulmonary hypertension.  CAD was not severe enough to explain his LV dysfunction.  Aggressive medical management was recommended.  2D echo 12/04/16 LVEF 20-25% with grade 3 DD and moderate MR.  He does have heavy alcohol intake.  I saw the patient in follow-up 12/18/16.  He was still having dyspnea on exertion and dizziness with his heart racing.  He was mildly orthostatic in the office.  I decreased his losartan to 50 mg daily and increase his carvedilol to 12.5 mg twice daily.  Patient comes in today accompanied by his wife.  He said he was extremely dizzy with that medication change so went back to 100 mg of losartan 5 of carvedilol twice daily.  He still gets dizzy and short of breath with very little activity.  He just does not feel good.  He is lost another 9 pounds.  He is not eating.  His wife is wondering if he needs carotid Dopplers done.  Heart start racing [place] as much as it has been.  Past Medical History:  Diagnosis Date  . Arthritis    "probably in all my joints" (12/03/2016)  . Asthma   . CAD (coronary artery disease)    a. LHC 12/04/16: 30% RCA, 50% RPDA, 25% mid LAD, 95% 1st diag, 70% 2nd diag.  . CHF (congestive heart failure), NYHA class II, chronic, combined ([place])    12/04/16: Echo EF 20-25%, Gr 3 DD, PA peak pressure 62 mmgHg, RHC: PCPW 29 mmHg  . Eczema   . Gout    "I take RX for it" (12/03/2016)  . Heart murmur   . Hypertension     . Mitral regurgitation    12/04/16: moderate by echo  . OSA (obstructive sleep apnea)    "never could tolerate the mask" (12/03/2016)  . Pulmonary hypertension ([place])    12/04/16: RHC: Mod PHTN, PAP 37 mmHg    Past Surgical History:  Procedure Laterality Date  . APPENDECTOMY  ~  1967  . BACK SURGERY    . COLONOSCOPY  ~ 2008  . ESOPHAGOGASTRODUODENOSCOPY  ~ 2008  . INGUINAL HERNIA REPAIR Right ~ 1976  . [place]; 1993   "Lora Havens did them both"  . RIGHT/LEFT HEART CATH AND CORONARY ANGIOGRAPHY N/A 12/04/2016   Procedure: RIGHT/LEFT HEART CATH AND CORONARY ANGIOGRAPHY;  Surgeon: Martinique, Peter M, MD;  Location: [facility];  Service: Cardiovascular;  Laterality: N/A;    Current Medications: Current Meds  Medication Sig  . albuterol (PROVENTIL HFA;VENTOLIN HFA) 108 (90 BASE) MCG/ACT inhaler Inhale 2 puffs into the lungs every 6 (six) hours as needed for wheezing.  Marland Kitchen allopurinol (ZYLOPRIM) 300 MG tablet   . aspirin 81 MG tablet Take 81 mg by mouth daily.  . benzonatate (TESSALON) 200 MG capsule Take 200 mg 3 (three) times daily as needed by mouth for cough.  . carvedilol (  COREG) 6.25 MG tablet Take 6.25 mg by mouth 2 (two) times daily with a meal.  . colchicine 0.6 MG tablet Take 0.6 mg as needed by mouth. Gout flare-up  . Fluticasone-Salmeterol (ADVAIR) 250-50 MCG/DOSE AEPB Inhale 1 puff into the lungs every 12 (twelve) hours.  . furosemide (LASIX) 40 MG tablet Take 1 tablet (40 mg total) daily by mouth.  . losartan (COZAAR) 100 MG tablet Take 100 mg by mouth daily.  . Multiple Vitamin (MULTIVITAMIN WITH MINERALS) TABS Take 1 tablet by mouth daily.  . polyethylene glycol powder (GLYCOLAX/MIRALAX) powder Take 17 g daily by mouth.  . potassium chloride SA (K-DUR,KLOR-CON) 20 MEQ tablet Take 1 tablet (20 mEq total) daily by mouth.     Allergies:   Patient has no known allergies.   Social History   Socioeconomic History  . Marital status: Married     Spouse name: None  . Number of children: None  . Years of education: None  . Highest education level: None  Social Needs  . Financial resource strain: None  . Food insecurity - worry: None  . Food insecurity - inability: None  . Transportation needs - medical: None  . Transportation needs - non-medical: None  Occupational History  . None  Tobacco Use  . Smoking status: Never Smoker  . Smokeless tobacco: Never Used  Substance and Sexual Activity  . Alcohol use: Yes    Alcohol/week: 12.0 oz    Types: 20 Cans of beer per week  . Drug use: No  . Sexual activity: Not Currently  Other Topics Concern  . None  Social History Narrative  . None     Family History:  The patient's family history includes CAD in his father; Heart attack in his brother and mother.   ROS:   Please see the history of present illness.    Review of Systems  Constitution: Negative.  HENT: Negative.   Cardiovascular: Positive for dyspnea on exertion.  Respiratory: Negative.   Endocrine: Negative.   Hematologic/Lymphatic: Negative.   Musculoskeletal: Negative.   Gastrointestinal: Negative.   Genitourinary: Negative.   Neurological: Negative.    All other systems reviewed and are negative.   PHYSICAL EXAM:   VS:  BP 98/60   Pulse (!) 106   Ht '5\' 10"'$  (1.778 m)   Wt 181 lb (82.1 kg)   SpO2 98%   BMI 25.97 kg/m   Physical Exam  GEN: Well nourished, well developed, in no acute distress  HEENT: normal  Neck: no JVD, carotid bruits, or masses Cardiac:RRR; no murmurs, rubs, or gallops  Respiratory:  clear to auscultation bilaterally, normal work of breathing GI: soft, nontender, nondistended, + BS Ext: without cyanosis, clubbing, or edema, Good distal pulses bilaterally MS: no deformity or atrophy  Skin: warm and dry, no rash Neuro:  Alert and Oriented x 3, Strength and sensation are intact Psych: euthymic mood, full affect  Wt Readings from Last 3 Encounters:  01/01/17 181 lb (82.1 kg)    12/18/16 190 lb (86.2 kg)  12/06/16 180 lb 12.8 oz (82 kg)      Studies/Labs Reviewed:   EKG:  EKG is not ordered today.    Recent Labs: 12/04/2016: B Natriuretic Peptide 1,027.4; Hemoglobin 15.7; Platelets 219 12/06/2016: BUN 26; Creatinine, Ser 0.97; Potassium 4.0; Sodium 138   Lipid Panel    Component Value Date/Time   CHOL 151 12/05/2016 0545   TRIG 69 12/05/2016 0545   HDL 36 (L) 12/05/2016 0545   CHOLHDL 4.2  12/05/2016 0545   VLDL 14 12/05/2016 0545   LDLCALC 101 (H) 12/05/2016 0545    Additional studies/ records that were reviewed today include:    Echocardiogram 12/04/16 Study Conclusions   - Left ventricle: The cavity size was moderately dilated. Systolic   function was severely reduced. The estimated ejection fraction   was in the range of 20% to 25%. Doppler parameters are consistent   with a reversible restrictive pattern, indicative of decreased   left ventricular diastolic compliance and/or increased left   atrial pressure (grade 3 diastolic dysfunction). - Aortic valve: Valve area (VTI): 2.62 cm^2. Valve area (Vmax):   2.81 cm^2. Valve area (Vmean): 2.55 cm^2. - Mitral valve: There was moderate regurgitation. - Left atrium: The atrium was mildly dilated. - Right atrium: The atrium was mildly to moderately dilated. - Pulmonary arteries: Systolic pressure was moderately to severely   increased. PA peak pressure: 62 mm Hg (S).     Left and Right heart catheterization 12/04/16 Conclusion     Prox RCA to Mid RCA lesion is 30% stenosed.  RPDA lesion is 50% stenosed.  Mid LAD lesion is 25% stenosed.  Prox LAD lesion is 20% stenosed.  Ost 1st Diag lesion is 60% stenosed.  Ost 1st Mrg lesion is 95% stenosed.  Ost 2nd Mrg lesion is 70% stenosed.  Hemodynamic findings consistent with moderate pulmonary hypertension.   1. CAD with 60% first diagonal, 95% ostial small OM1, 70% OM2, and 50% mid PDA 2. Moderate pulmonary HTN with mean PAP 37 mm  Hg 3. Moderately elevated LV filling pressures with PCWP of 29 mm Hg 4. Reduced CO with index 1.65.    Plan: CAD is not severe enough to explain his LV dysfunction. He needs aggressive medical management for CHF.       ASSESSMENT:    1. Orthostatic hypotension   2. Systolic CHF with reduced left ventricular function, NYHA class 3 ([place])   3. Essential (primary) hypertension   4. Non-rheumatic mitral regurgitation   5. Coronary artery disease involving native coronary artery of native heart without angina pectoris   6. ETOH abuse      PLAN:  In order of problems listed above:  Orthostatic hypotension we could get his blood pressure when he first sat up but eventually were able to get it.  Unfortunately he went back to the higher dose losartan.  We will decrease losartan to 50 mg daily and decrease Lasix to 20 mg daily.  He is to call if he does not feel any better.  With all his dizziness and ears ringing we will also get carotid Dopplers.  We will also check be met and CBC.  Follow-up with Dr. Johnsie Cancel next week.  Systolic CHF with decreased LV function EF 20-25% out of proportion to his CAD.  Plan for medical management and follow-up echo in February.  Cannot titrate medications at this time because of orthostatic hypotension and weight loss.  Essential hypertension patient continues to be hypotensive and having to decrease his medications.  Moderate MR follow-up on echo in February  CAD as described above out of proportion to LV dysfunction no angina  Alcohol abuse patient has not had anything to drink since his hospitalization but does not believe that this contributed to his heart failure.    Medication Adjustments/Labs and Tests Ordered: Current medicines are reviewed at length with the patient today.  Concerns regarding medicines are outlined above.  Medication changes, Labs and Tests ordered today are listed in  the Patient Instructions below. Patient Instructions   Medication Instructions:  Your physician has recommended you make the following change in your medication: Decrease Losartan to 50 mg Daily  Decrease Lasix 20 mg Daily  Decrease Coreg 6.25 mg Two Times Daily  Labwork: Your physician recommends that you return for lab work Today    Testing/Procedures: Your physician has requested that you have a carotid duplex. This test is an ultrasound of the carotid arteries in your neck. It looks at blood flow through these arteries that supply the brain with blood. Allow one hour for this exam. There are no restrictions or special instructions.    Follow-Up: Your physician recommends that you schedule a follow-up appointment with Dr. Johnsie Cancel    Any Other Special Instructions Will Be Listed Below (If Applicable).     If you need a refill on your cardiac medications before your next appointment, please call your pharmacy.  Thank you for choosing [facility]!      Sumner Boast, PA-C  01/01/2017 2:34 PM    [facility] [address], [place], [place]  [address] Phone: [phone]; Fax: [phone]

## 2017-01-01 NOTE — Patient Instructions (Signed)
Medication Instructions:  Your physician has recommended you make the following change in your medication: Decrease Losartan to 50 mg Daily  Decrease Lasix 20 mg Daily  Decrease Coreg 6.25 mg Two Times Daily  Labwork: Your physician recommends that you return for lab work Today    Testing/Procedures: Your physician has requested that you have a carotid duplex. This test is an ultrasound of the carotid arteries in your neck. It looks at blood flow through these arteries that supply the brain with blood. Allow one hour for this exam. There are no restrictions or special instructions.    Follow-Up: Your physician recommends that you schedule a follow-up appointment with Dr. Eden Emms    Any Other Special Instructions Will Be Listed Below (If Applicable).     If you need a refill on your cardiac medications before your next appointment, please call your pharmacy.  Thank you for choosing [facility]!

## 2017-01-02 ENCOUNTER — Encounter: Payer: Self-pay | Admitting: Physician Assistant

## 2017-01-03 ENCOUNTER — Ambulatory Visit (HOSPITAL_COMMUNITY)
Admission: RE | Admit: 2017-01-03 | Discharge: 2017-01-03 | Disposition: A | Discharge status: Home / Self Care | Payer: BLUE CROSS/BLUE SHIELD | Source: Ambulatory Visit | Attending: Physician Assistant | Admitting: Physician Assistant

## 2017-01-03 DIAGNOSIS — R42 Dizziness and giddiness: Secondary | ICD-10-CM | POA: Diagnosis present

## 2017-01-03 DIAGNOSIS — I951 Orthostatic hypotension: Secondary | ICD-10-CM | POA: Diagnosis not present

## 2017-01-03 DIAGNOSIS — I6523 Occlusion and stenosis of bilateral carotid arteries: Secondary | ICD-10-CM | POA: Insufficient documentation

## 2017-01-06 ENCOUNTER — Telehealth: Payer: Self-pay | Admitting: *Deleted

## 2017-01-06 DIAGNOSIS — I2510 Atherosclerotic heart disease of native coronary artery without angina pectoris: Secondary | ICD-10-CM

## 2017-01-06 NOTE — Telephone Encounter (Signed)
-----   Message from Dyann Kief, PA-C sent at 01/06/2017  8:00 AM EST ----- Carotids have plaque but not significant stenosis. Repeat in 1 yr.

## 2017-01-15 ENCOUNTER — Ambulatory Visit: Payer: BLUE CROSS/BLUE SHIELD | Admitting: Physician Assistant

## 2017-01-20 NOTE — Progress Notes (Signed)
Cardiology Office Note    Date:  01/22/2017   ID:  Stephen Fleming, Stephen Fleming September 12, 1955, MRN [id number]  PCP:  Louie Boston., MD  Cardiologist: Dr. Eden Emms   Chief Complaint  Patient presents with  . Follow-up    3 weeks    History of Present Illness:    Stephen Fleming is a 61 y.o. male with past medical history of chronic combined systolic and diastolic CHF (EF 51-02% by echo in 11/2016), CAD (cath in 11/2016 showing 60% first diagonal, 95% ostial small OM1, 70% OM2, and 50% mid PDA), moderate MR, HTN, orthostatic hypotension, and OSA (intolerant to CPAP) who presents to the office today for 3-week follow-up.   The patient was admitted to [facility] in 11/2016 at which time an echocardiogram showed a significantly reduced EF of 20-25%. A cardiac catheterization was performed during admission and noted to have CAD as outlined below but was not thought to be severe enough to explain his significant LV dysfunction. He had been consuming a significant amount of alcohol prior to admission and this was thought to have played a role.  At the time of follow-up on 01/01/2017, he was still having significant dizziness and had continued on Losartan 100 mg daily and Coreg 6.25 mg twice daily. He was hypotensive at 98/60 during his visit and orthostatics were positive, therefore Losartan was decreased to 50 mg daily and Lasix was decreased to 20 mg daily.  In talking with the patient today, he reports having a decreased appetite ever since 11/2016. He had been on Losartan prior to admission and feels like Carvedilol might be the cause of this. He asks to be switched to a different medication.  He denies any repeat episodes of chest discomfort but has had continual dyspnea on exertion and significant fatigue since hospital discharge. He does not check his blood pressure regularly but it remains soft at 99/78 during today's visit despite medication adjustments as made above.  Toward the end of the encounter,  he does report having 2 syncopal episodes over the past month. These occurred while he was up walking around and is unsure how long his loss of consciousness lasted. Notes having dyspnea leading up to the episode but denies any associated palpitations, lightheadedness, dizziness, or chest pain. He denies any alcohol use since 11/2016.  Past Medical History:  Diagnosis Date  . Arthritis    "probably in all my joints" (12/03/2016)  . Asthma   . CAD (coronary artery disease)    a. LHC 12/04/16: 30% RCA, 50% RPDA, 25% mid LAD, 95% 1st diag, 70% 2nd diag.  . CHF (congestive heart failure), NYHA class II, chronic, combined (HCC)    12/04/16: Echo EF 20-25%, Gr 3 DD, PA peak pressure 62 mmgHg, RHC: PCPW 29 mmHg  . Eczema   . Gout    "I take RX for it" (12/03/2016)  . Heart murmur   . Hypertension   . Mitral regurgitation    12/04/16: moderate by echo  . OSA (obstructive sleep apnea)    "never could tolerate the mask" (12/03/2016)  . Pulmonary hypertension (HCC)    12/04/16: RHC: Mod PHTN, PAP 37 mmHg    Past Surgical History:  Procedure Laterality Date  . APPENDECTOMY  ~  1967  . BACK SURGERY    . COLONOSCOPY  ~ 2008  . ESOPHAGOGASTRODUODENOSCOPY  ~ 2008  . INGUINAL HERNIA REPAIR Right ~ 1976  . LUMBAR DISC SURGERY  1991; 1993   "[name] did them both"  .  RIGHT/LEFT HEART CATH AND CORONARY ANGIOGRAPHY N/A 12/04/2016   Procedure: RIGHT/LEFT HEART CATH AND CORONARY ANGIOGRAPHY;  Surgeon: [place], Peter M, MD;  Location: [facility] INVASIVE CV LAB;  Service: Cardiovascular;  Laterality: N/A;    Current Medications: Outpatient Medications Prior to Visit  Medication Sig Dispense Refill  . albuterol (PROVENTIL HFA;VENTOLIN HFA) 108 (90 BASE) MCG/ACT inhaler Inhale 2 puffs into the lungs every 6 (six) hours as needed for wheezing.    Marland Kitchen aspirin 81 MG tablet Take 81 mg by mouth daily.    . colchicine 0.6 MG tablet Take 0.6 mg as needed by mouth. Gout flare-up    . Fluticasone-Salmeterol  (ADVAIR) 250-50 MCG/DOSE AEPB Inhale 1 puff into the lungs every 12 (twelve) hours.    . furosemide (LASIX) 20 MG tablet Take 1 tablet (20 mg total) by mouth daily. 90 tablet 3  . losartan (COZAAR) 50 MG tablet Take 50 mg by mouth daily.    . Multiple Vitamin (MULTIVITAMIN WITH MINERALS) TABS Take 1 tablet by mouth daily.    . polyethylene glycol powder (GLYCOLAX/MIRALAX) powder Take 17 g daily by mouth.    . potassium chloride SA (K-DUR,KLOR-CON) 20 MEQ tablet Take 1 tablet (20 mEq total) daily by mouth. 30 tablet 5  . benzonatate (TESSALON) 200 MG capsule Take 200 mg by mouth.    . carvedilol (COREG) 6.25 MG tablet Take 6.25 mg by mouth 2 (two) times daily with a meal.    . losartan (COZAAR) 100 MG tablet Take 100 mg by mouth daily.    Marland Kitchen allopurinol (ZYLOPRIM) 300 MG tablet     . benzonatate (TESSALON) 200 MG capsule Take 200 mg 3 (three) times daily as needed by mouth for cough.  0   No facility-administered medications prior to visit.      Allergies:   Patient has no known allergies.   Social History   Socioeconomic History  . Marital status: Married    Spouse name: None  . Number of children: None  . Years of education: None  . Highest education level: None  Social Needs  . Financial resource strain: None  . Food insecurity - worry: None  . Food insecurity - inability: None  . Transportation needs - medical: None  . Transportation needs - non-medical: None  Occupational History  . None  Tobacco Use  . Smoking status: Never Smoker  . Smokeless tobacco: Never Used  Substance and Sexual Activity  . Alcohol use: Yes    Alcohol/week: 12.0 oz    Types: 20 Cans of beer per week  . Drug use: No  . Sexual activity: Not Currently  Other Topics Concern  . None  Social History Narrative  . None     Family History:  The patient's family history includes CAD in his father; Heart attack in his brother and mother.   Review of Systems:   Please see the history of present  illness.     General:  No chills, fever, night sweats or weight changes. Positive for fatigue.  Cardiovascular:  No chest pain, edema, orthopnea, palpitations, paroxysmal nocturnal dyspnea. Positive for dyspnea on exertion and syncope.  Dermatological: No rash, lesions/masses Respiratory: No cough, dyspnea Urologic: No hematuria, dysuria Abdominal:   No nausea, vomiting, diarrhea, bright red blood per rectum, melena, or hematemesis Neurologic:  No visual changes, wkns, changes in mental status. All other systems reviewed and are otherwise negative except as noted above.   Physical Exam:    VS:  BP 99/78  Pulse 78   Ht 5\' 10"  (1.778 m)   Wt 178 lb (80.7 kg)   SpO2 98%   BMI 25.54 kg/m    General: Well developed, well nourished Caucasian male appearing in no acute distress. Head: Normocephalic, atraumatic, sclera non-icteric, no xanthomas, nares are without discharge.  Neck: No carotid bruits. JVD not elevated.  Lungs: Respirations regular and unlabored, without wheezes or rales.  Heart: Regular rate and rhythm. No S3 or S4.  No murmur, no rubs, or gallops appreciated. Abdomen: Soft, non-tender, non-distended with normoactive bowel sounds. No hepatomegaly. No rebound/guarding. No obvious abdominal masses. Msk:  Strength and tone appear normal for age. No joint deformities or effusions. Extremities: No clubbing or cyanosis. No lower extremity edema.  Distal pedal pulses are 2+ bilaterally. Neuro: Alert and oriented X 3. Moves all extremities spontaneously. No focal deficits noted. Psych:  Responds to questions appropriately with a normal affect. Skin: No rashes or lesions noted  Wt Readings from Last 3 Encounters:  01/22/17 178 lb (80.7 kg)  01/01/17 181 lb (82.1 kg)  12/18/16 190 lb (86.2 kg)     Studies/Labs Reviewed:   EKG:  EKG is not ordered today.    Recent Labs: 12/04/2016: B Natriuretic Peptide 1,027.4 01/01/2017: BUN 18; Creatinine, Ser 1.12; Hemoglobin 16.7;  Platelets 245; Potassium 4.3; Sodium 140   Lipid Panel    Component Value Date/Time   CHOL 151 12/05/2016 0545   TRIG 69 12/05/2016 0545   HDL 36 (L) 12/05/2016 0545   CHOLHDL 4.2 12/05/2016 0545   VLDL 14 12/05/2016 0545   LDLCALC 101 (H) 12/05/2016 0545    Additional studies/ records that were reviewed today include:   Echocardiogram: 12/04/2016 Study Conclusions  - Left ventricle: The cavity size was moderately dilated. Systolic   function was severely reduced. The estimated ejection fraction   was in the range of 20% to 25%. Doppler parameters are consistent   with a reversible restrictive pattern, indicative of decreased   left ventricular diastolic compliance and/or increased left   atrial pressure (grade 3 diastolic dysfunction). - Aortic valve: Valve area (VTI): 2.62 cm^2. Valve area (Vmax):   2.81 cm^2. Valve area (Vmean): 2.55 cm^2. - Mitral valve: There was moderate regurgitation. - Left atrium: The atrium was mildly dilated. - Right atrium: The atrium was mildly to moderately dilated. - Pulmonary arteries: Systolic pressure was moderately to severely   increased. PA peak pressure: 62 mm Hg (S).  Cardiac Catheterization: 12/04/2016  Prox RCA to Mid RCA lesion is 30% stenosed.  RPDA lesion is 50% stenosed.  Mid LAD lesion is 25% stenosed.  Prox LAD lesion is 20% stenosed.  Ost 1st Diag lesion is 60% stenosed.  Ost 1st Mrg lesion is 95% stenosed.  Ost 2nd Mrg lesion is 70% stenosed.  Hemodynamic findings consistent with moderate pulmonary hypertension.   1. CAD with 60% first diagonal, 95% ostial small OM1, 70% OM2, and 50% mid PDA 2. Moderate pulmonary HTN with mean PAP 37 mm Hg 3. Moderately elevated LV filling pressures with PCWP of 29 mm Hg 4. Reduced CO with index 1.65.   Plan: CAD is not severe enough to explain his LV dysfunction. He needs aggressive medical management for CHF.   Assessment:    1. Chronic combined systolic and diastolic  CHF (congestive heart failure) (HCC)   2. Coronary artery disease involving native coronary artery of native heart without angina pectoris   3. Syncope, unspecified syncope type   4. Non-rheumatic mitral regurgitation  5. Essential (primary) hypertension   6. Bilateral carotid artery stenosis      Plan:   In order of problems listed above:  1. Chronic Combined Systolic and Diastolic CHF - EF 20-25% by echo in 11/2016. He reports continual dyspnea on exertion but denies any recent orthopnea, PND, or lower extremity edema. He does not appear volume overloaded by physical examination.  - continue Lasix 20mg  daily along with Losartan for his cardiomyopathy. Has experienced a decreased appetite since being started on Coreg, therefore will switch to Toprol-XL 12.5mg  daily. Can titrate as HR and BP allow (lower dose as he has been experiencing hypotension). 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 Will arrange for close follow-up in 4 weeks to reassess his symptoms.  A repeat echocardiogram is scheduled in 02/2017 to reassess LV function and wall motion.   2. CAD - cath in 11/2016 showing 60% first diagonal, 95% ostial small OM1, 70% OM2, and 50% mid PDA. Medical management recommended at that time in the setting of his small-vessel disease.  - he has baseline dyspnea on exertion but denies any recent chest pain.  - continue on ASA and BB. Intolerant to statin therapy in the past.   3. Syncope - he reports having 2 syncopal episodes over the past month. These occurred while he was up walking around and is unsure how long his loss of consciousness lasted. Notes having dyspnea leading up to the episode but denies any associated palpitations, lightheadedness, dizziness, or chest pain.  - concern for an arrhythmia in the setting of his cardiomyopathy. Plan for a 30-day cardiac event monitor in  the setting of his syncopal events. He is aware that under [place] Law he should not drive for 6 months.   4. Moderate MR - noted on recent echocardiogram.  - continue to follow.   5. HTN - BP is soft at 99//78 during today's visit.  - stop Coreg and switch to Toprol-XL 12.5mg  daily as outlined above. I have asked the patient to follow his BP at home. Continue Losartan 50mg  daily.   6. Carotid Artery Stenosis - carotid dopplers in 12/2016 showed moderate plaque bilaterally.  - repeat in 1 year.    Medication Adjustments/Labs and Tests Ordered: Current medicines are reviewed at length with the patient today.  Concerns regarding medicines are outlined above.  Medication changes, Labs and Tests ordered today are listed in the Patient Instructions below. Patient Instructions  Medication Instructions:  Your physician has recommended you make the following change in your medication:  STOP Taking Coreg  Start Taking Toprol XL 12.5 mg Daily   Labwork: NONE   Testing/Procedures: Your physician has recommended that you wear an event monitor. Event monitors are medical devices that record the heart's electrical activity. Doctors most often [place] these monitors to diagnose arrhythmias. Arrhythmias are problems with the speed or rhythm of the heartbeat. The monitor is a small, portable device. You can wear one while you do your normal daily activities. This is usually used to diagnose what is causing palpitations/syncope (passing out).  Follow-Up: Your physician recommends that you schedule a follow-up appointment in: 1 Month  Any Other Special Instructions Will Be Listed Below (If Applicable). You have been given a note for work.   If you need a refill on your cardiac medications before your next appointment, please call your pharmacy. Thank you  for choosing [facility]!    Signed, Ellsworth LennoxBrittany M Strader, PA-C  01/22/2017 4:53 PM    [facility] [address],  [address] [place], [place]  [address] Phone: [phone](336) [phone]; Fax: [phone]  [address], [address] [place], [place] [address] Phone: [phone]

## 2017-01-22 ENCOUNTER — Ambulatory Visit: Payer: BLUE CROSS/BLUE SHIELD | Admitting: Student

## 2017-01-22 ENCOUNTER — Encounter: Payer: Self-pay | Admitting: Student

## 2017-01-22 ENCOUNTER — Encounter: Payer: Self-pay | Admitting: *Deleted

## 2017-01-22 VITALS — BP 99/78 | HR 78 | Ht 70.0 in | Wt 178.0 lb

## 2017-01-22 DIAGNOSIS — I340 Nonrheumatic mitral (valve) insufficiency: Secondary | ICD-10-CM | POA: Diagnosis not present

## 2017-01-22 DIAGNOSIS — I2510 Atherosclerotic heart disease of native coronary artery without angina pectoris: Secondary | ICD-10-CM

## 2017-01-22 DIAGNOSIS — R55 Syncope and collapse: Secondary | ICD-10-CM | POA: Diagnosis not present

## 2017-01-22 DIAGNOSIS — I10 Essential (primary) hypertension: Secondary | ICD-10-CM | POA: Diagnosis not present

## 2017-01-22 DIAGNOSIS — I6523 Occlusion and stenosis of bilateral carotid arteries: Secondary | ICD-10-CM | POA: Diagnosis not present

## 2017-01-22 DIAGNOSIS — I5042 Chronic combined systolic (congestive) and diastolic (congestive) heart failure: Secondary | ICD-10-CM

## 2017-01-22 MED ORDER — METOPROLOL SUCCINATE ER 25 MG PO TB24: 12.5000 mg | ORAL_TABLET | Freq: Every day | ORAL | 3 refills | Status: DC

## 2017-01-22 MED ORDER — BENZONATATE 200 MG PO CAPS: 200.0000 mg | ORAL_CAPSULE | Freq: Three times a day (TID) | ORAL | 0 refills | Status: DC | PRN

## 2017-01-22 NOTE — Patient Instructions (Signed)
Medication Instructions:  Your physician has recommended you make the following change in your medication:  STOP Taking Coreg  Start Taking Toprol XL 12.5 mg Daily     Labwork: NONE   Testing/Procedures: Your physician has recommended that you wear an event monitor. Event monitors are medical devices that record the heart's electrical activity. Doctors most often [place] these monitors to diagnose arrhythmias. Arrhythmias are problems with the speed or rhythm of the heartbeat. The monitor is a small, portable device. You can wear one while you do your normal daily activities. This is usually used to diagnose what is causing palpitations/syncope (passing out).    Follow-Up: Your physician recommends that you schedule a follow-up appointment in: 1 Month   Any Other Special Instructions Will Be Listed Below (If Applicable). You have been given a note for work.     If you need a refill on your cardiac medications before your next appointment, please call your pharmacy. Thank you for choosing [facility]!

## 2017-01-30 ENCOUNTER — Other Ambulatory Visit: Payer: Self-pay | Admitting: *Deleted

## 2017-01-30 ENCOUNTER — Encounter (INDEPENDENT_AMBULATORY_CARE_PROVIDER_SITE_OTHER): Payer: BLUE CROSS/BLUE SHIELD

## 2017-01-30 DIAGNOSIS — R55 Syncope and collapse: Secondary | ICD-10-CM

## 2017-02-08 ENCOUNTER — Telehealth: Payer: Self-pay | Admitting: Cardiology

## 2017-02-08 NOTE — Telephone Encounter (Signed)
Pt's wife called the on-call answering service for increased pain in both of his lower extremities. She reports that his pain began on Thursday evening and has progressively become worse. He is not very mobile to begin with (per wife) and has not been able to walk around the his house without significant amount of pain. She reports that he has no associated symptoms such as SOB, increased weight, chest pain, or palpitations. She reports that he has not had any recent illnesses. He has not taken any OTC  medications, only his normal prescribed medications.   She was advised to give him Tylenol as directed on the bottle for his pain and to elevated his legs. If this does not help him, she was advised to either call his primary care provider or come to the emergency department, as this does not appears to be cardiac in nature.   Georgie Chard NP-C [facility] Pager: [phone]

## 2017-02-10 ENCOUNTER — Encounter (HOSPITAL_COMMUNITY): Payer: Self-pay | Admitting: Emergency Medicine

## 2017-02-10 ENCOUNTER — Emergency Department (HOSPITAL_COMMUNITY): Payer: BLUE CROSS/BLUE SHIELD

## 2017-02-10 ENCOUNTER — Telehealth: Payer: Self-pay | Admitting: Physician Assistant

## 2017-02-10 ENCOUNTER — Inpatient Hospital Stay (HOSPITAL_COMMUNITY)
Admission: EM | Admit: 2017-02-10 | Discharge: 2017-02-26 | DRG: 175 | Disposition: A | Discharge status: Skilled Nursing Facility | Payer: BLUE CROSS/BLUE SHIELD | Attending: Internal Medicine | Admitting: Internal Medicine

## 2017-02-10 DIAGNOSIS — R0600 Dyspnea, unspecified: Secondary | ICD-10-CM | POA: Diagnosis present

## 2017-02-10 DIAGNOSIS — J189 Pneumonia, unspecified organism: Secondary | ICD-10-CM | POA: Diagnosis not present

## 2017-02-10 DIAGNOSIS — Z8249 Family history of ischemic heart disease and other diseases of the circulatory system: Secondary | ICD-10-CM

## 2017-02-10 DIAGNOSIS — J942 Hemothorax: Secondary | ICD-10-CM | POA: Diagnosis not present

## 2017-02-10 DIAGNOSIS — E875 Hyperkalemia: Secondary | ICD-10-CM | POA: Diagnosis present

## 2017-02-10 DIAGNOSIS — I2729 Other secondary pulmonary hypertension: Secondary | ICD-10-CM | POA: Diagnosis present

## 2017-02-10 DIAGNOSIS — I493 Ventricular premature depolarization: Secondary | ICD-10-CM | POA: Diagnosis present

## 2017-02-10 DIAGNOSIS — E876 Hypokalemia: Secondary | ICD-10-CM | POA: Diagnosis not present

## 2017-02-10 DIAGNOSIS — E440 Moderate protein-calorie malnutrition: Secondary | ICD-10-CM | POA: Diagnosis present

## 2017-02-10 DIAGNOSIS — Z7982 Long term (current) use of aspirin: Secondary | ICD-10-CM

## 2017-02-10 DIAGNOSIS — I2699 Other pulmonary embolism without acute cor pulmonale: Principal | ICD-10-CM

## 2017-02-10 DIAGNOSIS — I5023 Acute on chronic systolic (congestive) heart failure: Secondary | ICD-10-CM

## 2017-02-10 DIAGNOSIS — Y95 Nosocomial condition: Secondary | ICD-10-CM | POA: Diagnosis present

## 2017-02-10 DIAGNOSIS — D696 Thrombocytopenia, unspecified: Secondary | ICD-10-CM | POA: Diagnosis present

## 2017-02-10 DIAGNOSIS — I82403 Acute embolism and thrombosis of unspecified deep veins of lower extremity, bilateral: Secondary | ICD-10-CM | POA: Diagnosis not present

## 2017-02-10 DIAGNOSIS — I471 Supraventricular tachycardia: Secondary | ICD-10-CM | POA: Diagnosis present

## 2017-02-10 DIAGNOSIS — R0602 Shortness of breath: Secondary | ICD-10-CM | POA: Diagnosis not present

## 2017-02-10 DIAGNOSIS — I959 Hypotension, unspecified: Secondary | ICD-10-CM | POA: Diagnosis not present

## 2017-02-10 DIAGNOSIS — J90 Pleural effusion, not elsewhere classified: Secondary | ICD-10-CM

## 2017-02-10 DIAGNOSIS — I428 Other cardiomyopathies: Secondary | ICD-10-CM | POA: Diagnosis present

## 2017-02-10 DIAGNOSIS — I82432 Acute embolism and thrombosis of left popliteal vein: Secondary | ICD-10-CM | POA: Diagnosis present

## 2017-02-10 DIAGNOSIS — Z7901 Long term (current) use of anticoagulants: Secondary | ICD-10-CM

## 2017-02-10 DIAGNOSIS — R042 Hemoptysis: Secondary | ICD-10-CM

## 2017-02-10 DIAGNOSIS — I10 Essential (primary) hypertension: Secondary | ICD-10-CM

## 2017-02-10 DIAGNOSIS — I272 Pulmonary hypertension, unspecified: Secondary | ICD-10-CM | POA: Diagnosis present

## 2017-02-10 DIAGNOSIS — G4733 Obstructive sleep apnea (adult) (pediatric): Secondary | ICD-10-CM | POA: Diagnosis present

## 2017-02-10 DIAGNOSIS — E559 Vitamin D deficiency, unspecified: Secondary | ICD-10-CM | POA: Diagnosis not present

## 2017-02-10 DIAGNOSIS — D751 Secondary polycythemia: Secondary | ICD-10-CM | POA: Diagnosis present

## 2017-02-10 DIAGNOSIS — J9601 Acute respiratory failure with hypoxia: Secondary | ICD-10-CM | POA: Diagnosis present

## 2017-02-10 DIAGNOSIS — I480 Paroxysmal atrial fibrillation: Secondary | ICD-10-CM | POA: Diagnosis present

## 2017-02-10 DIAGNOSIS — I82443 Acute embolism and thrombosis of tibial vein, bilateral: Secondary | ICD-10-CM | POA: Diagnosis present

## 2017-02-10 DIAGNOSIS — I447 Left bundle-branch block, unspecified: Secondary | ICD-10-CM | POA: Diagnosis present

## 2017-02-10 DIAGNOSIS — J45909 Unspecified asthma, uncomplicated: Secondary | ICD-10-CM

## 2017-02-10 DIAGNOSIS — R195 Other fecal abnormalities: Secondary | ICD-10-CM | POA: Diagnosis not present

## 2017-02-10 DIAGNOSIS — J918 Pleural effusion in other conditions classified elsewhere: Secondary | ICD-10-CM | POA: Diagnosis not present

## 2017-02-10 DIAGNOSIS — E785 Hyperlipidemia, unspecified: Secondary | ICD-10-CM | POA: Diagnosis present

## 2017-02-10 DIAGNOSIS — I2510 Atherosclerotic heart disease of native coronary artery without angina pectoris: Secondary | ICD-10-CM | POA: Diagnosis present

## 2017-02-10 DIAGNOSIS — M109 Gout, unspecified: Secondary | ICD-10-CM | POA: Diagnosis present

## 2017-02-10 DIAGNOSIS — I82411 Acute embolism and thrombosis of right femoral vein: Secondary | ICD-10-CM | POA: Diagnosis present

## 2017-02-10 DIAGNOSIS — R918 Other nonspecific abnormal finding of lung field: Secondary | ICD-10-CM

## 2017-02-10 DIAGNOSIS — Z79899 Other long term (current) drug therapy: Secondary | ICD-10-CM

## 2017-02-10 DIAGNOSIS — J9690 Respiratory failure, unspecified, unspecified whether with hypoxia or hypercapnia: Secondary | ICD-10-CM

## 2017-02-10 DIAGNOSIS — I5042 Chronic combined systolic (congestive) and diastolic (congestive) heart failure: Secondary | ICD-10-CM

## 2017-02-10 DIAGNOSIS — Z4682 Encounter for fitting and adjustment of non-vascular catheter: Secondary | ICD-10-CM

## 2017-02-10 DIAGNOSIS — I080 Rheumatic disorders of both mitral and aortic valves: Secondary | ICD-10-CM | POA: Diagnosis present

## 2017-02-10 DIAGNOSIS — I5043 Acute on chronic combined systolic (congestive) and diastolic (congestive) heart failure: Secondary | ICD-10-CM | POA: Diagnosis not present

## 2017-02-10 DIAGNOSIS — F1021 Alcohol dependence, in remission: Secondary | ICD-10-CM | POA: Diagnosis present

## 2017-02-10 DIAGNOSIS — M1990 Unspecified osteoarthritis, unspecified site: Secondary | ICD-10-CM | POA: Diagnosis present

## 2017-02-10 DIAGNOSIS — Z6824 Body mass index (BMI) 24.0-24.9, adult: Secondary | ICD-10-CM

## 2017-02-10 DIAGNOSIS — D72829 Elevated white blood cell count, unspecified: Secondary | ICD-10-CM

## 2017-02-10 DIAGNOSIS — I110 Hypertensive heart disease with heart failure: Secondary | ICD-10-CM | POA: Diagnosis present

## 2017-02-10 DIAGNOSIS — Z7951 Long term (current) use of inhaled steroids: Secondary | ICD-10-CM

## 2017-02-10 LAB — BASIC METABOLIC PANEL
ANION GAP: 14 (ref 5–15)
BUN: 35 mg/dL — ABNORMAL HIGH (ref 6–20)
CHLORIDE: 100 mmol/L — AB (ref 101–111)
CO2: 21 mmol/L — ABNORMAL LOW (ref 22–32)
Calcium: 9.6 mg/dL (ref 8.9–10.3)
Creatinine, Ser: 1.09 mg/dL (ref 0.61–1.24)
GFR calc non Af Amer: >60 mL/min (ref >60)
Glucose, Bld: 94 mg/dL (ref 65–99)
POTASSIUM: 5.2 mmol/L — AB (ref 3.5–5.1)
SODIUM: 135 mmol/L (ref 135–145)

## 2017-02-10 LAB — CBC WITH DIFFERENTIAL/PLATELET
BASOS PCT: 0 %
Basophils Absolute: 0 10*3/uL (ref 0.0–0.1)
EOS ABS: 0 10*3/uL (ref 0.0–0.7)
Eosinophils Relative: 0 %
HCT: 54.4 % — ABNORMAL HIGH (ref 39.0–52.0)
HEMOGLOBIN: 18.1 g/dL — AB (ref 13.0–17.0)
Lymphocytes Relative: 20 %
Lymphs Abs: 2.2 10*3/uL (ref 0.7–4.0)
MCH: 31.5 pg (ref 26.0–34.0)
MCHC: 33.3 g/dL (ref 30.0–36.0)
MCV: 94.8 fL (ref 78.0–100.0)
MONOS PCT: 9 %
Monocytes Absolute: 0.9 10*3/uL (ref 0.1–1.0)
NEUTROS PCT: 71 %
Neutro Abs: 7.6 10*3/uL (ref 1.7–7.7)
Platelets: ADEQUATE 10*3/uL (ref 150–400)
RBC: 5.74 MIL/uL (ref 4.22–5.81)
RDW: 15.1 % (ref 11.5–15.5)
WBC: 10.7 10*3/uL — AB (ref 4.0–10.5)

## 2017-02-10 LAB — HEPATIC FUNCTION PANEL
ALBUMIN: 3.8 g/dL (ref 3.5–5.0)
ALK PHOS: 70 U/L (ref 38–126)
ALT: 45 U/L (ref 17–63)
AST: 59 U/L — ABNORMAL HIGH (ref 15–41)
Bilirubin, Direct: 1.4 mg/dL — ABNORMAL HIGH (ref 0.1–0.5)
Indirect Bilirubin: 1.9 mg/dL — ABNORMAL HIGH (ref 0.3–0.9)
TOTAL PROTEIN: 6.8 g/dL (ref 6.5–8.1)
Total Bilirubin: 3.3 mg/dL — ABNORMAL HIGH (ref 0.3–1.2)

## 2017-02-10 LAB — TROPONIN I: Troponin I: <0.03 ng/mL (ref <0.03)

## 2017-02-10 LAB — PROTIME-INR
INR: 1.43
PROTHROMBIN TIME: 17.3 s — AB (ref 11.4–15.2)

## 2017-02-10 LAB — APTT: APTT: 35 s (ref 24–36)

## 2017-02-10 MED ORDER — HEPARIN BOLUS VIA INFUSION
4000.0000 [IU] | Freq: Once | INTRAVENOUS | Status: AC
  Administered 2017-02-10: 4000 [IU] via INTRAVENOUS

## 2017-02-10 MED ORDER — HEPARIN (PORCINE) IN NACL 100-0.45 UNIT/ML-% IJ SOLN
1000.0000 [IU]/h | INTRAMUSCULAR | Status: DC
Start: 2017-02-10 — End: 2017-02-12
  Administered 2017-02-10: 1300 [IU]/h via INTRAVENOUS
  Administered 2017-02-11: 1200 [IU]/h via INTRAVENOUS
  Administered 2017-02-12: 1100 [IU]/h via INTRAVENOUS
  Filled 2017-02-10 (×5): qty 250

## 2017-02-10 MED ORDER — ASPIRIN 325 MG PO TABS
325.0000 mg | ORAL_TABLET | Freq: Once | ORAL | Status: AC
Start: 2017-02-10 — End: 2017-02-10
  Administered 2017-02-10: 325 mg via ORAL
  Filled 2017-02-10: qty 1

## 2017-02-10 NOTE — Progress Notes (Signed)
ANTICOAGULATION CONSULT NOTE - Initial Consult  Pharmacy Consult for heparin Indication: pulmonary embolus  No Known Allergies  Patient Measurements: Height: 5\' 10"  (177.8 cm) Weight: 171 lb 12.8 oz (77.9 kg) IBW/kg (Calculated) : 73 HEPARIN DW (KG): 77.9  Vital Signs: Temp: 98.5 F (36.9 C) (01/21 1557) Temp Source: Oral (01/21 1557) BP: 101/86 (01/21 1730) Pulse Rate: 78 (01/21 1557)  Labs: No results for input(s): HGB, HCT, PLT, APTT, LABPROT, INR, HEPARINUNFRC, HEPRLOWMOCWT, CREATININE, CKTOTAL, CKMB, TROPONINI in the last 72 hours.  CrCl cannot be calculated (Patient's most recent lab result is older than the maximum 21 days allowed.).   Medical History: Past Medical History:  Diagnosis Date  . Arthritis    "probably in all my joints" (12/03/2016)  . Asthma   . CAD (coronary artery disease)    a. LHC 12/04/16: 30% RCA, 50% RPDA, 25% mid LAD, 95% 1st diag, 70% 2nd diag.  . CHF (congestive heart failure), NYHA class II, chronic, combined (HCC)    12/04/16: Echo EF 20-25%, Gr 3 DD, PA peak pressure 62 mmgHg, RHC: PCPW 29 mmHg  . Eczema   . Gout    "I take RX for it" (12/03/2016)  . Heart murmur   . Hypertension   . Mitral regurgitation    12/04/16: moderate by echo  . OSA (obstructive sleep apnea)    "never could tolerate the mask" (12/03/2016)  . Pulmonary hypertension (HCC)    12/04/16: RHC: Mod PHTN, PAP 37 mmHg    Medications:  Med rec reviewed  Assessment: 62 yo male presented to ED with progressive shortness of breath, swelling and pain in legs. ( Especially left leg.)  Venous Ultrasound is positive for occlusion; DVT in both left and right leg. Pharmacy asked to start heparin.  Goal of Therapy:  Heparin level 0.3-0.7 units/ml Monitor platelets by anticoagulation protocol: Yes   Plan:  Give 4000 units bolus x 1 Start heparin infusion at 1300 units/hr Check anti-Xa level in 6-8 hours and daily while on heparin Continue to monitor H&H and platelets    Elder Cyphers, BS Loura Back, BCPS Clinical Pharmacist Pager [phone] 02/10/2017,6:33 PM

## 2017-02-10 NOTE — Telephone Encounter (Signed)
Called pt. No answer. Left message for pt to return call.  

## 2017-02-10 NOTE — ED Notes (Addendum)
Patient transported to Ultrasound and xray 

## 2017-02-10 NOTE — Telephone Encounter (Signed)
Received voicemail to call [phone] concerning this pt, states he is having swelling in his leg and hurting really bad. Pt is currently admitted in the ED.

## 2017-02-10 NOTE — ED Notes (Signed)
Pt returned to room from xray and ultrasound.

## 2017-02-10 NOTE — ED Triage Notes (Signed)
Pt reports being dx with chf in November and for the past week has been having progressive shortness of breath with swelling and pain in legs.  Especially left leg.

## 2017-02-10 NOTE — ED Provider Notes (Signed)
[facility] EMERGENCY DEPARTMENT Provider Note   CSN: [id number] Arrival date & time: 02/10/17  1510     History   Chief Complaint Chief Complaint  Patient presents with  . Shortness of Breath    HPI Stephen Fleming is a 62 y.o. male.  HPI Complains of chest pain and bilateral leg pain right greater than left onset 5 days ago pain is exacerbated by walking and improved with rest.  Symptoms accompanied by shortness of breath.  No treatment prior to coming here.  Presently asymptomatic he denies fever denies cough.  No other associated symptoms.  Patient states on a normal day he can walk from parking lot and around supermarket.  He has trouble walking his house over the past 5 days due to pain and dyspnea. Past Medical History:  Diagnosis Date  . Arthritis    "probably in all my joints" (12/03/2016)  . Asthma   . CAD (coronary artery disease)    a. LHC 12/04/16: 30% RCA, 50% RPDA, 25% mid LAD, 95% 1st diag, 70% 2nd diag.  . CHF (congestive heart failure), NYHA class II, chronic, combined (HCC)    12/04/16: Echo EF 20-25%, Gr 3 DD, PA peak pressure 62 mmgHg, RHC: PCPW 29 mmHg  . Eczema   . Gout    "I take RX for it" (12/03/2016)  . Heart murmur   . Hypertension   . Mitral regurgitation    12/04/16: moderate by echo  . OSA (obstructive sleep apnea)    "never could tolerate the mask" (12/03/2016)  . Pulmonary hypertension (HCC)    12/04/16: RHC: Mod PHTN, PAP 37 mmHg    Patient Active Problem List   Diagnosis Date Noted  . Orthostatic hypotension 01/01/2017  . ETOH abuse 12/17/2016  . Mitral regurgitation 12/06/2016  . CAD (coronary artery disease) 12/06/2016  . Essential (primary) hypertension 12/04/2016  . CHF (congestive heart failure), NYHA class III, acute on chronic, combined (HCC) 12/03/2016  . Dyspnea 12/03/2016  . Systolic CHF with reduced left ventricular function, NYHA class 3 (HCC) 12/03/2016  . Asthma 12/03/2016    Past Surgical History:  Procedure  Laterality Date  . APPENDECTOMY  ~  1967  . BACK SURGERY    . COLONOSCOPY  ~ 2008  . ESOPHAGOGASTRODUODENOSCOPY  ~ 2008  . INGUINAL HERNIA REPAIR Right ~ 1976  . LUMBAR DISC SURGERY  1991; 1993   "[name] did them both"  . RIGHT/LEFT HEART CATH AND CORONARY ANGIOGRAPHY N/A 12/04/2016   Procedure: RIGHT/LEFT HEART CATH AND CORONARY ANGIOGRAPHY;  Surgeon: [place], Peter M, MD;  Location: [facility] INVASIVE CV LAB;  Service: Cardiovascular;  Laterality: N/A;       Home Medications    Prior to Admission medications   Medication Sig Start Date End Date Taking? Authorizing Provider  albuterol (PROVENTIL HFA;VENTOLIN HFA) 108 (90 BASE) MCG/ACT inhaler Inhale 2 puffs into the lungs every 6 (six) hours as needed for wheezing.   Yes [provider]  aspirin 81 MG tablet Take 81 mg by mouth daily.   Yes [provider]  benzonatate (TESSALON) 200 MG capsule Take 1 capsule (200 mg total) by mouth 3 (three) times daily as needed for cough. 01/22/17  Yes Strader, [place] M, PA-C  Fluticasone-Salmeterol (ADVAIR) 250-50 MCG/DOSE AEPB Inhale 1 puff into the lungs every 12 (twelve) hours.   Yes [provider]  furosemide (LASIX) 20 MG tablet Take 1 tablet (20 mg total) by mouth daily. 01/01/17 04/01/17 Yes Dyann Kief, PA-C  losartan (  COZAAR) 50 MG tablet Take 50 mg by mouth daily.   Yes [provider]  metoprolol succinate (TOPROL XL) 25 MG 24 hr tablet Take 0.5 tablets (12.5 mg total) by mouth daily. 01/22/17  Yes Strader, [place] M, PA-C  polyethylene glycol powder (GLYCOLAX/MIRALAX) powder Take 17 g daily by mouth. 08/31/16  Yes [provider]  potassium chloride SA (K-DUR,KLOR-CON) 20 MEQ tablet Take 1 tablet (20 mEq total) daily by mouth. 12/07/16  Yes Berton Bon, NP    Family History Family History  Problem Relation Age of Onset  . Heart attack Mother   . CAD Father   . Heart attack Brother     Social History Social History   Tobacco  Use  . Smoking status: Never Smoker  . Smokeless tobacco: Never Used  Substance Use Topics  . Alcohol use: Yes    Alcohol/week: 12.0 oz    Types: 20 Cans of beer per week  . Drug use: No     Allergies   Patient has no known allergies.   Review of Systems Review of Systems  Constitutional: Negative.   HENT: Negative.   Respiratory: Positive for shortness of breath.   Cardiovascular: Positive for chest pain.       Syncope  Gastrointestinal: Negative.   Musculoskeletal: Positive for myalgias.       Lateral calf pain.  Right greater than left  Skin: Negative.   Allergic/Immunologic: Negative.   Neurological: Negative.   Psychiatric/Behavioral: Negative.   All other systems reviewed and are negative.    Physical Exam Updated Vital Signs BP 105/80 (BP Location: Right Arm)   Pulse 78   Temp 98.5 F (36.9 C) (Oral)   Resp 18   Ht 5\' 10"  (1.778 m)   Wt 77.9 kg (171 lb 12.8 oz)   SpO2 100%   BMI 24.65 kg/m   Physical Exam  Constitutional: He appears well-developed and well-nourished.  HENT:  Head: Normocephalic and atraumatic.  Eyes: Conjunctivae are normal. Pupils are equal, round, and reactive to light.  Neck: Neck supple. No tracheal deviation present. No thyromegaly present.  Cardiovascular: Normal rate and regular rhythm.  No murmur heard. Pulmonary/Chest: Effort normal and breath sounds normal.  Abdominal: Soft. Bowel sounds are normal. He exhibits no distension. There is no tenderness.  Musculoskeletal: Normal range of motion. He exhibits no edema or tenderness.  Swelling of right calf with corresponding tenderness.  DP pulses 1+ bilaterally femoral pulses 1+ bilaterally  Neurological: He is alert. Coordination normal.  Skin: Skin is warm and dry. No rash noted.  Psychiatric: He has a normal mood and affect.  Nursing note and vitals reviewed.    ED Treatments / Results  Labs (all labs ordered are listed, but only abnormal results are displayed) Labs  Reviewed  TROPONIN I  CBC WITH DIFFERENTIAL/PLATELET  BASIC METABOLIC PANEL    EKG  EKG Interpretation  Date/Time:  Monday February 10 2017 16:14:22 EST Ventricular Rate:  98 PR Interval:    QRS Duration: 166 QT Interval:  416 QTC Calculation: 532 R Axis:   -99 Text Interpretation:  Sinus rhythm Left bundle branch block No significant change since last tracing Confirmed by Raeford Razor [phone]) on 02/10/2017 4:42:26 PM     No significant change since last tracing  Radiology No results found.  Procedures Procedures (including critical care time)  Medications Ordered in ED Medications  aspirin tablet 325 mg (not administered)   Chest x-ray viewed by me   Results for orders placed  or performed during the hospital encounter of 02/10/17  Troponin I  Result Value Ref Range   Troponin I <0.03 <0.03 ng/mL  Basic metabolic panel  Result Value Ref Range   Sodium 135 135 - 145 mmol/L   Potassium 5.2 (H) 3.5 - 5.1 mmol/L   Chloride 100 (L) 101 - 111 mmol/L   CO2 21 (L) 22 - 32 mmol/L   Glucose, Bld 94 65 - 99 mg/dL   BUN 35 (H) 6 - 20 mg/dL   Creatinine, Ser 3.20 0.61 - 1.24 mg/dL   Calcium 9.6 8.9 - 23.3 mg/dL   GFR calc non Af Amer >60 >60 mL/min   GFR calc Af Amer >60 >60 mL/min   Anion gap 14 5 - 15  Protime-INR  Result Value Ref Range   Prothrombin Time 17.3 (H) 11.4 - 15.2 seconds   INR 1.43   APTT  Result Value Ref Range   aPTT 35 24 - 36 seconds  CBC with Differential/Platelet  Result Value Ref Range   WBC 10.7 (H) 4.0 - 10.5 K/uL   RBC 5.74 4.22 - 5.81 MIL/uL   Hemoglobin 18.1 (H) 13.0 - 17.0 g/dL   HCT 43.5 (H) 68.6 - 16.8 %   MCV 94.8 78.0 - 100.0 fL   MCH 31.5 26.0 - 34.0 pg   MCHC 33.3 30.0 - 36.0 g/dL   RDW 37.2 90.2 - 11.1 %   Platelets  150 - 400 K/uL    PLATELET CLUMPS NOTED ON SMEAR, COUNT APPEARS ADEQUATE   Neutrophils Relative % 71 %   Neutro Abs 7.6 1.7 - 7.7 K/uL   Lymphocytes Relative 20 %   Lymphs Abs 2.2 0.7 - 4.0 K/uL   Monocytes  Relative 9 %   Monocytes Absolute 0.9 0.1 - 1.0 K/uL   Eosinophils Relative 0 %   Eosinophils Absolute 0.0 0.0 - 0.7 K/uL   Basophils Relative 0 %   Basophils Absolute 0.0 0.0 - 0.1 K/uL  Hepatic function panel  Result Value Ref Range   Total Protein 6.8 6.5 - 8.1 g/dL   Albumin 3.8 3.5 - 5.0 g/dL   AST 59 (H) 15 - 41 U/L   ALT 45 17 - 63 U/L   Alkaline Phosphatase 70 38 - 126 U/L   Total Bilirubin 3.3 (H) 0.3 - 1.2 mg/dL   Bilirubin, Direct 1.4 (H) 0.1 - 0.5 mg/dL   Indirect Bilirubin 1.9 (H) 0.3 - 0.9 mg/dL   Dg Chest 2 View  Result Date: 02/10/2017 CLINICAL DATA:  Swelling and pain in legs. Congestive heart failure. EXAM: CHEST  2 VIEW COMPARISON:  Two-view chest x-ray 12/03/2016. FINDINGS: Heart is enlarged. There is no edema or effusion. Left lower lobe airspace disease is noted. The visualized soft tissues and bony thorax are unremarkable. IMPRESSION: 1. Cardiomegaly without failure. 2. Ill-defined left lower lobe airspace disease is concerning for early pneumonia. Electronically Signed   By: Marin Roberts M.D.   On: 02/10/2017 17:02   US Venous Img Lower Bilateral  Result Date: 02/10/2017 CLINICAL DATA:  Bilateral lower extremity pain and swelling. EXAM: BILATERAL LOWER EXTREMITY VENOUS DOPPLER ULTRASOUND TECHNIQUE: Gray-scale sonography with compression, as well as color and duplex ultrasound, were performed to evaluate the deep venous system from the level of the common femoral vein through the popliteal and proximal calf veins. COMPARISON:  None FINDINGS: On the right, there is occlusive thrombus in the popliteal vein. There thrombus in the femoral vein. This is occlusive in the mid and distal  segments. The thrombus extends to its confluence with the deep femoral vein. There is gastrocnemius DVT. The posterior tibial vein is occluded and noncompressible. Peroneal veins are not well seen. There is subcutaneous edema. Visualized segments of the saphenous venous system normal in  caliber and compressibility. On the left, there is thrombus in the popliteal vein which is occlusive and noncompressible. There is posterior tibial vein DVT, occlusive and noncompressible. Visualized segments of the saphenous venous system normal in caliber and compressibility. IMPRESSION: 1. Occlusive DVT in LEFT posterior tibial and popliteal veins. 2. Occlusive DVT in RIGHT posterior tibial, popliteal, and femoral veins. Electronically Signed   By: Corlis Leak M.D.   On: 02/10/2017 18:15   Initial Impression / Assessment and Plan / ED Course  I have reviewed the triage vital signs and the nursing notes.  Pertinent labs & imaging results that were available during my care of the patient were reviewed by me and considered in my medical decision making (see chart for details).    Dyspnea and chest pain likely secondary to pulmonary embolism infiltrate more likely pulmonary embolism and pneumonia.  No cough no fever no leukocytosis Patient treated with intravenous heparin per pharmacy protocol of consulted Dr. Melynda Ripple who will arrange for admission 9:45 PM patient resting comfortably after treatment with intravenous heparin Final Clinical Impressions(s) / ED Diagnoses  Diagnosis #1 bilateral deep vein thrombosis #2 exertional chest pain #3 exertional dyspnea CRITICAL CARE Performed by: Doug Sou Total critical care time: 30 minutes Critical care time was exclusive of separately billable procedures and treating other patients. Critical care was necessary to treat or prevent imminent or life-threatening deterioration. Critical care was time spent personally by me on the following activities: development of treatment plan with patient and/or surrogate as well as nursing, discussions with consultants, evaluation of patient's response to treatment, examination of patient, obtaining history from patient or surrogate, ordering and performing treatments and interventions, ordering and review of  laboratory studies, ordering and review of radiographic studies, pulse oximetry and re-evaluation of patient's condition. Final diagnoses:  None    ED Discharge Orders    None       Doug Sou, MD 02/10/17 2153

## 2017-02-11 ENCOUNTER — Observation Stay (HOSPITAL_COMMUNITY): Payer: BLUE CROSS/BLUE SHIELD

## 2017-02-11 ENCOUNTER — Other Ambulatory Visit: Payer: Self-pay

## 2017-02-11 DIAGNOSIS — I110 Hypertensive heart disease with heart failure: Secondary | ICD-10-CM | POA: Diagnosis present

## 2017-02-11 DIAGNOSIS — I340 Nonrheumatic mitral (valve) insufficiency: Secondary | ICD-10-CM | POA: Diagnosis not present

## 2017-02-11 DIAGNOSIS — I82403 Acute embolism and thrombosis of unspecified deep veins of lower extremity, bilateral: Secondary | ICD-10-CM | POA: Diagnosis not present

## 2017-02-11 DIAGNOSIS — R195 Other fecal abnormalities: Secondary | ICD-10-CM | POA: Diagnosis not present

## 2017-02-11 DIAGNOSIS — I82443 Acute embolism and thrombosis of tibial vein, bilateral: Secondary | ICD-10-CM | POA: Diagnosis present

## 2017-02-11 DIAGNOSIS — I2729 Other secondary pulmonary hypertension: Secondary | ICD-10-CM | POA: Diagnosis present

## 2017-02-11 DIAGNOSIS — I5043 Acute on chronic combined systolic (congestive) and diastolic (congestive) heart failure: Secondary | ICD-10-CM | POA: Diagnosis not present

## 2017-02-11 DIAGNOSIS — I2609 Other pulmonary embolism with acute cor pulmonale: Secondary | ICD-10-CM | POA: Diagnosis not present

## 2017-02-11 DIAGNOSIS — I824Y3 Acute embolism and thrombosis of unspecified deep veins of proximal lower extremity, bilateral: Secondary | ICD-10-CM | POA: Diagnosis not present

## 2017-02-11 DIAGNOSIS — R0600 Dyspnea, unspecified: Secondary | ICD-10-CM | POA: Diagnosis not present

## 2017-02-11 DIAGNOSIS — I2510 Atherosclerotic heart disease of native coronary artery without angina pectoris: Secondary | ICD-10-CM | POA: Diagnosis present

## 2017-02-11 DIAGNOSIS — I5023 Acute on chronic systolic (congestive) heart failure: Secondary | ICD-10-CM | POA: Diagnosis not present

## 2017-02-11 DIAGNOSIS — R042 Hemoptysis: Secondary | ICD-10-CM | POA: Diagnosis not present

## 2017-02-11 DIAGNOSIS — E559 Vitamin D deficiency, unspecified: Secondary | ICD-10-CM | POA: Diagnosis not present

## 2017-02-11 DIAGNOSIS — D72829 Elevated white blood cell count, unspecified: Secondary | ICD-10-CM | POA: Diagnosis not present

## 2017-02-11 DIAGNOSIS — I2699 Other pulmonary embolism without acute cor pulmonale: Secondary | ICD-10-CM | POA: Diagnosis present

## 2017-02-11 DIAGNOSIS — I428 Other cardiomyopathies: Secondary | ICD-10-CM | POA: Diagnosis present

## 2017-02-11 DIAGNOSIS — I5042 Chronic combined systolic (congestive) and diastolic (congestive) heart failure: Secondary | ICD-10-CM | POA: Diagnosis not present

## 2017-02-11 DIAGNOSIS — I429 Cardiomyopathy, unspecified: Secondary | ICD-10-CM | POA: Diagnosis not present

## 2017-02-11 DIAGNOSIS — J90 Pleural effusion, not elsewhere classified: Secondary | ICD-10-CM | POA: Diagnosis not present

## 2017-02-11 DIAGNOSIS — R0603 Acute respiratory distress: Secondary | ICD-10-CM

## 2017-02-11 DIAGNOSIS — J9601 Acute respiratory failure with hypoxia: Secondary | ICD-10-CM | POA: Diagnosis present

## 2017-02-11 DIAGNOSIS — I272 Pulmonary hypertension, unspecified: Secondary | ICD-10-CM | POA: Diagnosis present

## 2017-02-11 DIAGNOSIS — F1021 Alcohol dependence, in remission: Secondary | ICD-10-CM | POA: Diagnosis present

## 2017-02-11 DIAGNOSIS — I2690 Septic pulmonary embolism without acute cor pulmonale: Secondary | ICD-10-CM | POA: Diagnosis not present

## 2017-02-11 DIAGNOSIS — I480 Paroxysmal atrial fibrillation: Secondary | ICD-10-CM | POA: Diagnosis present

## 2017-02-11 DIAGNOSIS — I080 Rheumatic disorders of both mitral and aortic valves: Secondary | ICD-10-CM | POA: Diagnosis present

## 2017-02-11 DIAGNOSIS — R0602 Shortness of breath: Secondary | ICD-10-CM | POA: Diagnosis present

## 2017-02-11 DIAGNOSIS — J918 Pleural effusion in other conditions classified elsewhere: Secondary | ICD-10-CM | POA: Diagnosis not present

## 2017-02-11 DIAGNOSIS — D696 Thrombocytopenia, unspecified: Secondary | ICD-10-CM | POA: Diagnosis present

## 2017-02-11 DIAGNOSIS — E440 Moderate protein-calorie malnutrition: Secondary | ICD-10-CM | POA: Diagnosis present

## 2017-02-11 DIAGNOSIS — Z6824 Body mass index (BMI) 24.0-24.9, adult: Secondary | ICD-10-CM | POA: Diagnosis not present

## 2017-02-11 DIAGNOSIS — I2602 Saddle embolus of pulmonary artery with acute cor pulmonale: Secondary | ICD-10-CM | POA: Diagnosis not present

## 2017-02-11 DIAGNOSIS — I82411 Acute embolism and thrombosis of right femoral vein: Secondary | ICD-10-CM | POA: Diagnosis present

## 2017-02-11 DIAGNOSIS — I471 Supraventricular tachycardia: Secondary | ICD-10-CM | POA: Diagnosis present

## 2017-02-11 DIAGNOSIS — E875 Hyperkalemia: Secondary | ICD-10-CM | POA: Diagnosis present

## 2017-02-11 DIAGNOSIS — Z791 Long term (current) use of non-steroidal anti-inflammatories (NSAID): Secondary | ICD-10-CM | POA: Diagnosis not present

## 2017-02-11 DIAGNOSIS — J942 Hemothorax: Secondary | ICD-10-CM | POA: Diagnosis not present

## 2017-02-11 DIAGNOSIS — E876 Hypokalemia: Secondary | ICD-10-CM | POA: Diagnosis not present

## 2017-02-11 DIAGNOSIS — R918 Other nonspecific abnormal finding of lung field: Secondary | ICD-10-CM | POA: Diagnosis not present

## 2017-02-11 DIAGNOSIS — R0902 Hypoxemia: Secondary | ICD-10-CM | POA: Diagnosis not present

## 2017-02-11 DIAGNOSIS — Y95 Nosocomial condition: Secondary | ICD-10-CM | POA: Diagnosis present

## 2017-02-11 DIAGNOSIS — J189 Pneumonia, unspecified organism: Secondary | ICD-10-CM | POA: Diagnosis not present

## 2017-02-11 DIAGNOSIS — I82432 Acute embolism and thrombosis of left popliteal vein: Secondary | ICD-10-CM | POA: Diagnosis present

## 2017-02-11 LAB — ECHOCARDIOGRAM COMPLETE
AVLVOTPG: 1 mmHg
CHL CUP DOP CALC LVOT VTI: 6.65 cm
CHL CUP TV REG PEAK VELOCITY: 278 cm/s
EERAT: 21.98
EWDT: 81 ms
FS: 4 % — AB (ref 28–44)
HEIGHTINCHES: 70 in
IVS/LV PW RATIO, ED: 0.89
LA diam end sys: 40 mm
LA diam index: 2.06 cm/m2
LA vol A4C: 91.4 ml
LA vol index: 56.1 mL/m2
LASIZE: 40 mm
LAVOL: 109 mL
LDCA: 3.46 cm2
LV PW d: 7.65 mm — AB (ref 0.6–1.1)
LV sys vol index: 69 mL/m2
LVDIAVOL: 160 mL — AB (ref 62–150)
LVDIAVOLIN: 82 mL/m2
LVEEAVG: 21.98
LVEEMED: 21.98
LVOT SV: 23 mL
LVOT diameter: 21 mm
LVOTPV: 47.3 cm/s
LVSYSVOL: 134 mL — AB (ref 21–61)
MV Dec: 81
MV pk E vel: 89 m/s
MVPG: 3 mmHg
MVPKAVEL: 28.8 m/s
RV LATERAL S' VELOCITY: 9.77 cm/s
RV TAPSE: 12.6 mm
RV sys press: 46 mmHg
Simpson's disk: 16
Stroke v: 26 ml
TDI e' medial: 4.05
TR max vel: 278 cm/s
WEIGHTICAEL: 2684.32 [oz_av]

## 2017-02-11 LAB — BASIC METABOLIC PANEL
Anion gap: 15 (ref 5–15)
BUN: 34 mg/dL — AB (ref 6–20)
CHLORIDE: 98 mmol/L — AB (ref 101–111)
CO2: 21 mmol/L — AB (ref 22–32)
CREATININE: 1.05 mg/dL (ref 0.61–1.24)
Calcium: 9.2 mg/dL (ref 8.9–10.3)
GFR calc Af Amer: >60 mL/min (ref >60)
GFR calc non Af Amer: >60 mL/min (ref >60)
GLUCOSE: 94 mg/dL (ref 65–99)
Potassium: 5.3 mmol/L — ABNORMAL HIGH (ref 3.5–5.1)
Sodium: 134 mmol/L — ABNORMAL LOW (ref 135–145)

## 2017-02-11 LAB — CBC
HEMATOCRIT: 53 % — AB (ref 39.0–52.0)
HEMOGLOBIN: 17.9 g/dL — AB (ref 13.0–17.0)
MCH: 32 pg (ref 26.0–34.0)
MCHC: 33.8 g/dL (ref 30.0–36.0)
MCV: 94.6 fL (ref 78.0–100.0)
Platelets: 104 10*3/uL — ABNORMAL LOW (ref 150–400)
RBC: 5.6 MIL/uL (ref 4.22–5.81)
RDW: 15.1 % (ref 11.5–15.5)
WBC: 12.4 10*3/uL — AB (ref 4.0–10.5)

## 2017-02-11 LAB — HEPARIN LEVEL (UNFRACTIONATED)
HEPARIN UNFRACTIONATED: 0.8 [IU]/mL — AB (ref 0.30–0.70)
HEPARIN UNFRACTIONATED: 0.59 [IU]/mL (ref 0.30–0.70)
Heparin Unfractionated: 0.74 IU/mL — ABNORMAL HIGH (ref 0.30–0.70)

## 2017-02-11 LAB — GLUCOSE, CAPILLARY: Glucose-Capillary: 119 mg/dL — ABNORMAL HIGH (ref 65–99)

## 2017-02-11 MED ORDER — FENTANYL CITRATE (PF) 100 MCG/2ML IJ SOLN
INTRAMUSCULAR | Status: AC
  Filled 2017-02-11: qty 2

## 2017-02-11 MED ORDER — ONDANSETRON HCL 4 MG/2ML IJ SOLN: 4.0000 mg | Freq: Four times a day (QID) | INTRAMUSCULAR | Status: DC | PRN

## 2017-02-11 MED ORDER — MIDAZOLAM HCL 2 MG/2ML IJ SOLN
INTRAMUSCULAR | Status: AC
  Filled 2017-02-11: qty 2

## 2017-02-11 MED ORDER — LORAZEPAM 1 MG PO TABS: 1.0000 mg | ORAL_TABLET | Freq: Four times a day (QID) | ORAL | Status: DC | PRN

## 2017-02-11 MED ORDER — IOPAMIDOL (ISOVUE-370) INJECTION 76%
100.0000 mL | Freq: Once | INTRAVENOUS | Status: AC | PRN
  Administered 2017-02-11: 100 mL via INTRAVENOUS

## 2017-02-11 MED ORDER — SODIUM CHLORIDE 0.9 % IV SOLN
INTRAVENOUS | Status: DC
  Administered 2017-02-11: 02:00:00 via INTRAVENOUS

## 2017-02-11 MED ORDER — ACETAMINOPHEN 325 MG PO TABS
650.0000 mg | ORAL_TABLET | Freq: Four times a day (QID) | ORAL | Status: DC | PRN
  Administered 2017-02-11 – 2017-02-14 (×3): 650 mg via ORAL
  Filled 2017-02-11 (×4): qty 2

## 2017-02-11 MED ORDER — ENSURE ENLIVE PO LIQD
237.0000 mL | Freq: Two times a day (BID) | ORAL | Status: DC
  Administered 2017-02-11 – 2017-02-18 (×12): 237 mL via ORAL

## 2017-02-11 MED ORDER — FUROSEMIDE 20 MG PO TABS
20.0000 mg | ORAL_TABLET | Freq: Every day | ORAL | Status: DC
  Administered 2017-02-11: 20 mg via ORAL
  Filled 2017-02-11: qty 1

## 2017-02-11 MED ORDER — SODIUM CHLORIDE 0.9 % IV BOLUS (SEPSIS)
500.0000 mL | Freq: Once | INTRAVENOUS | Status: AC
  Administered 2017-02-11: 500 mL via INTRAVENOUS

## 2017-02-11 MED ORDER — HYDROCODONE-HOMATROPINE 5-1.5 MG/5ML PO SYRP
5.0000 mL | ORAL_SOLUTION | Freq: Once | ORAL | Status: AC
  Administered 2017-02-11: 5 mL via ORAL
  Filled 2017-02-11: qty 5

## 2017-02-11 MED ORDER — VITAMIN B-1 100 MG PO TABS
100.0000 mg | ORAL_TABLET | Freq: Every day | ORAL | Status: DC
  Administered 2017-02-11 – 2017-02-26 (×16): 100 mg via ORAL
  Filled 2017-02-11 (×16): qty 1

## 2017-02-11 MED ORDER — LOSARTAN POTASSIUM 50 MG PO TABS
50.0000 mg | ORAL_TABLET | Freq: Every day | ORAL | Status: DC
  Administered 2017-02-11: 50 mg via ORAL
  Filled 2017-02-11: qty 1

## 2017-02-11 MED ORDER — ALBUTEROL SULFATE (2.5 MG/3ML) 0.083% IN NEBU: 2.5000 mg | INHALATION_SOLUTION | RESPIRATORY_TRACT | Status: DC | PRN

## 2017-02-11 MED ORDER — HYDROCODONE-HOMATROPINE 5-1.5 MG/5ML PO SYRP
5.0000 mL | ORAL_SOLUTION | ORAL | Status: DC | PRN
  Administered 2017-02-11: 5 mL via ORAL
  Filled 2017-02-11: qty 5

## 2017-02-11 MED ORDER — POTASSIUM CHLORIDE CRYS ER 20 MEQ PO TBCR
20.0000 meq | EXTENDED_RELEASE_TABLET | Freq: Every day | ORAL | Status: DC
  Administered 2017-02-11: 20 meq via ORAL
  Filled 2017-02-11: qty 1

## 2017-02-11 MED ORDER — FOLIC ACID 1 MG PO TABS
1.0000 mg | ORAL_TABLET | Freq: Every day | ORAL | Status: DC
  Administered 2017-02-11 – 2017-02-26 (×16): 1 mg via ORAL
  Filled 2017-02-11 (×16): qty 1

## 2017-02-11 MED ORDER — ADULT MULTIVITAMIN W/MINERALS CH
1.0000 | ORAL_TABLET | Freq: Every day | ORAL | Status: DC
  Administered 2017-02-11 – 2017-02-26 (×16): 1 via ORAL
  Filled 2017-02-11 (×16): qty 1

## 2017-02-11 MED ORDER — MOMETASONE FURO-FORMOTEROL FUM 200-5 MCG/ACT IN AERO
2.0000 | INHALATION_SPRAY | Freq: Two times a day (BID) | RESPIRATORY_TRACT | Status: DC
  Administered 2017-02-11 – 2017-02-26 (×24): 2 via RESPIRATORY_TRACT
  Filled 2017-02-11 (×3): qty 8.8

## 2017-02-11 MED ORDER — ASPIRIN EC 81 MG PO TBEC
81.0000 mg | DELAYED_RELEASE_TABLET | Freq: Every day | ORAL | Status: DC
  Administered 2017-02-11 – 2017-02-15 (×5): 81 mg via ORAL
  Filled 2017-02-11 (×5): qty 1

## 2017-02-11 MED ORDER — ACETAMINOPHEN 650 MG RE SUPP
650.0000 mg | Freq: Four times a day (QID) | RECTAL | Status: DC | PRN
Start: 2017-02-11 — End: 2017-02-27

## 2017-02-11 MED ORDER — ONDANSETRON HCL 4 MG PO TABS: 4.0000 mg | ORAL_TABLET | Freq: Four times a day (QID) | ORAL | Status: DC | PRN

## 2017-02-11 MED ORDER — LORAZEPAM 2 MG/ML IJ SOLN: 1.0000 mg | Freq: Four times a day (QID) | INTRAMUSCULAR | Status: DC | PRN

## 2017-02-11 MED ORDER — ASPIRIN EC 81 MG PO TBEC
81.0000 mg | DELAYED_RELEASE_TABLET | Freq: Every day | ORAL | Status: DC
  Filled 2017-02-11 (×3): qty 1

## 2017-02-11 MED ORDER — METOPROLOL SUCCINATE ER 25 MG PO TB24
12.5000 mg | ORAL_TABLET | Freq: Every day | ORAL | Status: DC
  Administered 2017-02-11: 12.5 mg via ORAL
  Filled 2017-02-11: qty 1

## 2017-02-11 MED ORDER — SODIUM CHLORIDE 0.9% FLUSH
3.0000 mL | Freq: Two times a day (BID) | INTRAVENOUS | Status: DC
  Administered 2017-02-11 – 2017-02-26 (×11): 3 mL via INTRAVENOUS

## 2017-02-11 MED ORDER — HYDRALAZINE HCL 20 MG/ML IJ SOLN: 10.0000 mg | Freq: Three times a day (TID) | INTRAMUSCULAR | Status: DC | PRN

## 2017-02-11 NOTE — Progress Notes (Signed)
Called report to [place], RN receiving nurse at [facility] ICU [id number]. Pt left floor in stable condition via [facility] transport. Earnstine Regal, RN

## 2017-02-11 NOTE — Consult Note (Addendum)
Cardiology Consult    Patient ID: Stephen Fleming; [id number]; 07/13/1955   Admit date: 02/10/2017 Date of Consult: 02/11/2017  Primary Care Provider: Deloria Lair., MD Primary Cardiologist: Dr. Johnsie Cancel  Patient Profile    Stephen Fleming is a 62 y.o. male with past medical history of chronic combined systolic and diastolic CHF (EF 96-75% by echo in 11/2016), CAD (cath in 11/2016 showing 60% first diagonal, 95% ostial small OM1, 70% OM2, and 50% mid PDA --> medical management recommended), moderate MR, HTN, orthostatic hypotension, and OSA (intolerant to CPAP) who is being seen today for the evaluation of CHF and medication management at the request of Dr. Carles Collet.   History of Present Illness    Stephen Fleming was initially admitted to [facility] in 11/2016 and found to have a reduced EF of 20-25% by echo with cath showing CAD but not thought to be severe enough to explain his underlying cardiomyopathy.   He was recently evaluated in the office by myself on 01/22/2017 and denied any recent chest pain but was experiencing dyspnea and fatigue since hospital discharge which was unchanged. Coreg was switched to Toprol-XL to see if this would help with his symptoms and to allow for a higher BP as he was hypotensive at 99/78 during the visit. He also reported two syncopal episodes while walking with no associated palpitations, dizziness, or chest pain noted, therefore a 30-day cardiac event monitor was placed to assess for arrhythmias in the setting of his cardiomyopathy.   He presented to [facility] ED on 02/10/2017 for evaluation of bilateral leg pain for the past 5 days. His wife called the after-hours Cardiology line on 02/08/2017 reporting he was experiencing bilateral leg pain and he denied any associated symptoms at that time. He was informed to try Tylenol and if no improvement then reach out to his PCP or go to the ED for further evaluation.    His pain did not improve and has persisted, therefore they  came to the ED. He has developed associated dyspnea at rest and with activity over the past 3-4 days as well. Was not experiencing chest discomfort until earlier this morning. Denies any associated orthopnea, PND, or edema. Denies any recent flights or long travel.   Initial labs show WBC 10.7, Hgb 18.1, platelets 104, Na+ 135, K+ 5.2, creatinine 1.09. EKG shows NSR, HR 98, with known LBBB. CXR showing cardiomegaly and ill-defined left lower lobe airspace disease. Lower extremity dopplers showed occlusive DVT in the left posterior tibial and popliteal veins with occlusive DVT in the right posterior tibial, popliteal, and femoral veins.   He has been started on IV Heparin and is undergoing a CTA today. Having some hemoptysis since being started on IV Heparin.   Past Medical History:  Diagnosis Date  . Arthritis    "probably in all my joints" (12/03/2016)  . Asthma   . CAD (coronary artery disease)    a. LHC 12/04/16: 30% RCA, 50% RPDA, 25% mid LAD, 95% 1st diag, 70% 2nd diag.  . CHF (congestive heart failure), NYHA class II, chronic, combined ([place])    12/04/16: Echo EF 20-25%, Gr 3 DD, PA peak pressure 62 mmgHg, RHC: PCPW 29 mmHg  . Eczema   . Gout    "I take RX for it" (12/03/2016)  . Heart murmur   . Hypertension   . Mitral regurgitation    12/04/16: moderate by echo  . OSA (obstructive sleep apnea)    "never could tolerate the mask" (  12/03/2016)  . Pulmonary hypertension ([place])    12/04/16: RHC: Mod PHTN, PAP 37 mmHg    Past Surgical History:  Procedure Laterality Date  . APPENDECTOMY  ~  1967  . BACK SURGERY    . COLONOSCOPY  ~ 2008  . ESOPHAGOGASTRODUODENOSCOPY  ~ 2008  . INGUINAL HERNIA REPAIR Right ~ 1976  . [place]; 1993   "Lora Havens did them both"  . RIGHT/LEFT HEART CATH AND CORONARY ANGIOGRAPHY N/A 12/04/2016   Procedure: RIGHT/LEFT HEART CATH AND CORONARY ANGIOGRAPHY;  Surgeon: Martinique, Peter M, MD;  Location: [place] [facility];  Service:  Cardiovascular;  Laterality: N/A;     Home Medications:  Prior to Admission medications   Medication Sig Start Date End Date Taking? Authorizing Provider  albuterol (PROVENTIL HFA;VENTOLIN HFA) 108 (90 BASE) MCG/ACT inhaler Inhale 2 puffs into the lungs every 6 (six) hours as needed for wheezing.   Yes [provider]  aspirin 81 MG tablet Take 81 mg by mouth daily.   Yes [provider]  benzonatate (TESSALON) 200 MG capsule Take 1 capsule (200 mg total) by mouth 3 (three) times daily as needed for cough. 01/22/17  Yes Strader, [place] M, PA-C  Fluticasone-Salmeterol (ADVAIR) 250-50 MCG/DOSE AEPB Inhale 1 puff into the lungs every 12 (twelve) hours.   Yes [provider]  furosemide (LASIX) 20 MG tablet Take 1 tablet (20 mg total) by mouth daily. 01/01/17 04/01/17 Yes Imogene Burn, PA-C  losartan (COZAAR) 50 MG tablet Take 50 mg by mouth daily.   Yes [provider]  metoprolol succinate (TOPROL XL) 25 MG 24 hr tablet Take 0.5 tablets (12.5 mg total) by mouth daily. 01/22/17  Yes Strader, [place] M, PA-C  polyethylene glycol powder (GLYCOLAX/MIRALAX) powder Take 17 g daily by mouth. 08/31/16  Yes [provider]  potassium chloride SA (K-DUR,KLOR-CON) 20 MEQ tablet Take 1 tablet (20 mEq total) daily by mouth. 12/07/16  Yes Daune Perch, NP    Inpatient Medications: Scheduled Meds: . aspirin EC  81 mg Oral Daily  . feeding supplement (ENSURE ENLIVE)  237 mL Oral BID BM  . furosemide  20 mg Oral Daily  . metoprolol succinate  12.5 mg Oral Daily  . mometasone-formoterol  2 puff Inhalation BID  . sodium chloride flush  3 mL Intravenous Q12H   Continuous Infusions: . heparin 1,200 Units/hr (02/11/17 1117)   PRN Meds: acetaminophen **OR** acetaminophen, albuterol, HYDROcodone-homatropine, ondansetron **OR** ondansetron (ZOFRAN) IV  Allergies:   No Known Allergies  Social History:   Social History   Socioeconomic History  . Marital  status: Married    Spouse name: Not on file  . Number of children: Not on file  . Years of education: Not on file  . Highest education level: Not on file  Social Needs  . Financial resource strain: Not on file  . Food insecurity - worry: Not on file  . Food insecurity - inability: Not on file  . Transportation needs - medical: Not on file  . Transportation needs - non-medical: Not on file  Occupational History  . Not on file  Tobacco Use  . Smoking status: Never Smoker  . Smokeless tobacco: Never Used  Substance and Sexual Activity  . Alcohol use: Yes    Alcohol/week: 12.0 oz    Types: 20 Cans of beer per week  . Drug use: No  . Sexual activity: Not Currently  Other Topics Concern  . Not on file  Social History  Narrative  . Not on file     Family History:    Family History  Problem Relation Age of Onset  . Heart attack Mother   . CAD Father   . Heart attack Brother       Review of Systems    General:  No chills, fever, night sweats or weight changes. Positive for bilateral leg pain.  Cardiovascular:  No chest pain, edema, orthopnea, palpitations, paroxysmal nocturnal dyspnea. Positive for dyspnea on exertion.  Dermatological: No rash, lesions/masses Respiratory: Positive for hemoptysis and dyspnea. Urologic: No hematuria, dysuria Abdominal:   No nausea, vomiting, diarrhea, bright red blood per rectum, melena, or hematemesis Neurologic:  No visual changes, wkns, changes in mental status. All other systems reviewed and are otherwise negative except as noted above.  Physical Exam/Data    Vitals:   02/10/17 1939 02/10/17 2333 02/11/17 0643 02/11/17 0853  BP: 104/83 104/79 103/82   Pulse: 99 (!) 104 (!) 102   Resp: _0 Temp:  (!) 97.4 F (36.3 C) 97.7 F (36.5 C)   TempSrc:  Oral Oral   SpO2: 100% 100% 99% 95%  Weight:  167 lb 12.3 oz (76.1 kg)    Height:        Intake/Output Summary (Last 24 hours) at 02/11/2017 1148 Last data filed at 02/11/2017  0814 Gross per 24 hour  Intake 663 ml  Output 225 ml  Net 438 ml   Filed Weights   02/10/17 1558 02/10/17 2333  Weight: 171 lb 12.8 oz (77.9 kg) 167 lb 12.3 oz (76.1 kg)   Body mass index is 24.07 kg/m.   General: Pleasant, Caucasian male appearing in NAD Psych: Normal affect. Neuro: Alert and oriented X 3. Moves all extremities spontaneously. HEENT: Normal  Neck: Supple without bruits or JVD. Lungs:  Resp regular and unlabored, CTA without wheezing or rales. Heart: RRR no s3, s4, or murmurs. Abdomen: Soft, non-tender, non-distended, BS + x 4.  Extremities: No clubbing, cyanosis or edema. DP/PT/Radials 2+ and equal bilaterally.   EKG:  The EKG was personally reviewed and demonstrates:  NSR, HR 98, with known LBBB.    Labs/Studies     Relevant CV Studies:  Cardiac Catheterization: 12/04/2016  Prox RCA to Mid RCA lesion is 30% stenosed.  RPDA lesion is 50% stenosed.  Mid LAD lesion is 25% stenosed.  Prox LAD lesion is 20% stenosed.  Ost 1st Diag lesion is 60% stenosed.  Ost 1st Mrg lesion is 95% stenosed.  Ost 2nd Mrg lesion is 70% stenosed.  Hemodynamic findings consistent with moderate pulmonary hypertension.   1. CAD with 60% first diagonal, 95% ostial small OM1, 70% OM2, and 50% mid PDA 2. Moderate pulmonary HTN with mean PAP 37 mm Hg 3. Moderately elevated LV filling pressures with PCWP of 29 mm Hg 4. Reduced CO with index 1.65.   Plan: CAD is not severe enough to explain his LV dysfunction. He needs aggressive medical management for CHF.   Echocardiogram: 12/04/2016 Study Conclusions  - Left ventricle: The cavity size was moderately dilated. Systolic   function was severely reduced. The estimated ejection fraction   was in the range of 20% to 25%. Doppler parameters are consistent   with a reversible restrictive pattern, indicative of decreased   left ventricular diastolic compliance and/or increased left   atrial pressure (grade 3 diastolic  dysfunction). - Aortic valve: Valve area (VTI): 2.62 cm^2. Valve area (Vmax):   2.81 cm^2. Valve area (Vmean): 2.55 cm^2. -  Mitral valve: There was moderate regurgitation. - Left atrium: The atrium was mildly dilated. - Right atrium: The atrium was mildly to moderately dilated. - Pulmonary arteries: Systolic pressure was moderately to severely   increased. PA peak pressure: 62 mm Hg (S).  Laboratory Data:  Chemistry Recent Labs  Lab 02/10/17 1704 02/11/17 0557  NA 135 134*  K 5.2* 5.3*  CL 100* 98*  CO2 21* 21*  GLUCOSE 94 94  BUN 35* 34*  CREATININE 1.09 1.05  CALCIUM 9.6 9.2  GFRNONAA >60 >60  GFRAA >60 >60  ANIONGAP 14 15    Recent Labs  Lab 02/10/17 1704  PROT 6.8  ALBUMIN 3.8  AST 59*  ALT 45  ALKPHOS 70  BILITOT 3.3*   Hematology Recent Labs  Lab 02/10/17 1958 02/11/17 0557  WBC 10.7* 12.4*  RBC 5.74 5.60  HGB 18.1* 17.9*  HCT 54.4* 53.0*  MCV 94.8 94.6  MCH 31.5 32.0  MCHC 33.3 33.8  RDW 15.1 15.1  PLT PLATELET CLUMPS NOTED ON SMEAR, COUNT APPEARS ADEQUATE 104*   Cardiac Enzymes Recent Labs  Lab 02/10/17 1704  TROPONINI <0.03   No results for input(s): TROPIPOC in the last 168 hours.  BNPNo results for input(s): BNP, PROBNP in the last 168 hours.  DDimer No results for input(s): DDIMER in the last 168 hours.  Radiology/Studies:  Dg Chest 2 View  Result Date: 02/10/2017 CLINICAL DATA:  Swelling and pain in legs. Congestive heart failure. EXAM: CHEST  2 VIEW COMPARISON:  Two-view chest x-ray 12/03/2016. FINDINGS: Heart is enlarged. There is no edema or effusion. Left lower lobe airspace disease is noted. The visualized soft tissues and bony thorax are unremarkable. IMPRESSION: 1. Cardiomegaly without failure. 2. Ill-defined left lower lobe airspace disease is concerning for early pneumonia. Electronically Signed   By: San Morelle M.D.   On: 02/10/2017 17:02   US Venous Img Lower Bilateral  Result Date: 02/10/2017 CLINICAL DATA:   Bilateral lower extremity pain and swelling. EXAM: BILATERAL LOWER EXTREMITY VENOUS DOPPLER ULTRASOUND TECHNIQUE: Gray-scale sonography with compression, as well as color and duplex ultrasound, were performed to evaluate the deep venous system from the level of the common femoral vein through the popliteal and proximal calf veins. COMPARISON:  None FINDINGS: On the right, there is occlusive thrombus in the popliteal vein. There thrombus in the femoral vein. This is occlusive in the mid and distal segments. The thrombus extends to its confluence with the deep femoral vein. There is gastrocnemius DVT. The posterior tibial vein is occluded and noncompressible. Peroneal veins are not well seen. There is subcutaneous edema. Visualized segments of the saphenous venous system normal in caliber and compressibility. On the left, there is thrombus in the popliteal vein which is occlusive and noncompressible. There is posterior tibial vein DVT, occlusive and noncompressible. Visualized segments of the saphenous venous system normal in caliber and compressibility. IMPRESSION: 1. Occlusive DVT in LEFT posterior tibial and popliteal veins. 2. Occlusive DVT in RIGHT posterior tibial, popliteal, and femoral veins. Electronically Signed   By: Lucrezia Europe M.D.   On: 02/10/2017 18:15     Assessment & Plan    1. Acute Bilateral DVT's - the patient reports having pain along his legs bilaterally for the past 5 days. Developed associated dyspnea at rest and with exertion over the past 3-4 days.  - Lower extremity dopplers on admission showed an occlusive DVT in the left posterior tibial and popliteal veins with occlusive DVT in the right posterior tibial, popliteal,  and femoral veins. CTA pending.  - continue IV Heparin for now. Monitor for evidence of worsening hemoptysis.  - unclear cause of his DVT as he denies any history of DVT/PE and no recent travel or surgeries. Echo is pending to assess for thrombus. 30-day monitor  already in place to assess for cardiac arrhythmias (can monitor on tele while admitted and resume monitor upon discharge).   2. Chronic Combined Systolic and Diastolic CHF - the patient has a known reduced EF of 20-25% by echo in 11/2016.  - does not appear volume overloaded by physical examination.  - remains on Toprol-XL and PTA Lasix 2m daily. Losartan held due to hypotension and hyperkalemia.   3. CAD - cardiac catheterization in 11/2016 showed 60% first diagonal, 95% ostial small OM1, 70% OM2, and 50% mid PDA. - has experienced worsening dyspnea on exertion over the past few days with CTA pending to assess for PE. Denies any chest pain.  - continue ASA and BB therapy. Previously intolerant to statin therapy.   4. HTN - BP stable at 101/79 - 115/98 since admission.  - on Toprol-XL 12.534mdaily and Losartan 5036maily PTA. Losartan currently held due to upcoming CTA and soft blood pressure.    5. Syncope - reported two syncopal episodes at the time of his recent appointment. Carotid dopplers in 12/2016 showed moderate plaque. 30-day monitor was placed at that time. The office has not been contacted about any significant arrhythmias in the interim. Continue to follow on telemetry during admission.   6. Hyperkalemia - K+ 5.3 this AM.  - PTA K-dur and Losartan held.     For questions or updates, please contact [facility] consult [email or web address] for contact info under Cardiology/STEMI.  Signed, BriErma HeritageA-C 02/11/2017, 11:48 AM Pager: [phone]. Seen and examined. Agree with the Resident/NP/PA-C note as written. RicCarold Fleming a 61 31o. male who is being seen today for the evaluation of  CHF at the request of Dr. TatCarles Colletr. [facility] and was noted to have extensive DVT and ultimately PE with right heart strain. LVEF is further reduced at 10-15% with basal to mid anteroseptal akinesis. [facility]  [facility] Dr. BraHarl Bowiehe cardiology attending) - please see his brief not from yesterday. I met Stephen Fleming today (02/12/2017). He seems to be stable on medical therapy. A thrombus in transit was noted in the right atrium on echo. Given his acute right heart failure, we have recommended holding his BB and diuretic. Exam today (02/12/2017) demonstrates older male, mild respiratory distress but not on oxygen, decreased breath sounds, regular tachycardia, LE swollen and tender to palpation.  Agree with current management - will ask Advanced CHF service to follow regarding volume management with worsening cardiomyopathy.  Time Spent with Patient: I have spent a total of 45 minutes with patient reviewing hospital notes, telemetry, EKGs, labs and examining the patient as well as establishing an assessment and plan that was discussed with the patient.>50% of time was spent in direct patient care.  Stephen Fleming, FACUcsf Medical Center At Mount ZionACHampshirerector of the [facility] Lip[facility]  Card[facility] Reduction [facility] Diplomate of the American Board of Clinical Lipidology Attending Cardiologist  Direct Dial: [phone]: [phone]:  [email or web address]

## 2017-02-11 NOTE — Progress Notes (Signed)
Heparin level resulted as 0.74 this afternoon. Notified Jeannett Senior in pharmacy. States will decrease dose to 1100 units/hour. Pharmacy to adjust orders. Earnstine Regal, RN

## 2017-02-11 NOTE — Progress Notes (Signed)
Initial Nutrition Assessment  DOCUMENTATION CODES:     INTERVENTION:  Ensure Enlive po BID, each supplement provides 350 kcal and 20 grams of protein    NUTRITION DIAGNOSIS:   Inadequate oral intake related to decreased appetite(taste changes ) as evidenced by meal completion < 50%, per patient/family report and unplanned wt loss of 12 lbs since November.   GOAL:   Patient will meet greater than or equal to 90% of their needs MONITOR:   PO intake, Supplement acceptance, Weight trends, Labs  REASON FOR ASSESSMENT:   Malnutrition Screening Tool    ASSESSMENT: Patient presents with shortness of breath and painful lower legs.  US findings: bilateral DVT. Hx of gout, HF, CAD and HTN.  The patient is complaining of taste changes since last November. He was hospitalized with increasing dyspnea and complaining of chest pain. Also complaining of bilateral ankle and leg swelling. He underwent heart cath and coronary angiography procedure.  His wife feels frustrated trying to cook for him. Foods he used to enjoy  have no flavor,  "taste like cardboard". He ate a few bites of breakfast but 0% for lunch. He has consumed 100% of 1 bottle Ensure Enlive (350 kcal, 20 gr protein). He has been drinking one ensure each day at home per wife.    His weight is down 12 lbs 7% since November. The patient had a daily beer drinking habit which he stopped a couple of months ago. This could contribute to an overall decrease in his daily caloric intake in addition to his poor appetite.   NUTRITION - FOCUSED PHYSICAL EXAM:    Most Recent Value  Orbital Region  No depletion  Upper Arm Region  No depletion  Thoracic and Lumbar Region  No depletion  Buccal Region  Mild depletion  Temple Region  No depletion  Clavicle Bone Region  Mild depletion  Clavicle and Acromion Bone Region  Mild depletion  Scapular Bone Region  No depletion  Dorsal Hand  Mild depletion  Patellar Region  No depletion  Anterior  Thigh Region  No depletion  Posterior Calf Region  No depletion  Edema (RD Assessment)  None  Eyes  Reviewed  Skin  Reviewed  Nails  Reviewed     Diet Order:  Diet Heart Room service appropriate? Yes; Fluid consistency: Thin  EDUCATION NEEDS:  Education needs have been addressed  Skin:  Skin Assessment: Reviewed RN Assessment  Last BM:  1/21   Height:   Ht Readings from Last 1 Encounters:  02/10/17 5\' 10"  (1.778 m)    Weight:   Wt Readings from Last 1 Encounters:  02/10/17 167 lb 12.3 oz (76.1 kg)    Ideal Body Weight:  75 kg  BMI:  Body mass index is 24.07 kg/m.  Estimated Nutritional Needs:   Kcal:  1952-2100  Protein:  90-99 gr   Fluid:  2.0 liters daily  Royann Shivers MS,RD,CSG,LDN Office: [phone] Pager: [phone]

## 2017-02-11 NOTE — Progress Notes (Signed)
Full cardiology consult to be staffed tomrorow. It is clear from follow up data his primary issue is his large PE, CHF does not appear to be the issue. PE management per primary team, consider discussing with pulmonary care given his PE and hemoptysis.  I would hold his beta blocker and diuretic due to some RV strain by CT and echo. Somewhat soft bp's but he is near his baseline according to old clinic notes. Would recommend monitoring him in stepdown.     Dina Rich MD

## 2017-02-11 NOTE — Consult Note (Signed)
Chief Complaint: Patient was seen in consultation today for  Chief Complaint  Patient presents with  . Shortness of Breath   at the request of * No referring provider recorded for this case *  Referring Physician(s): * No referring provider recorded for this case *  Supervising Physician: Jolaine Click  Patient Status: [facility] - In-pt  History of Present Illness: Stephen Fleming is a 62 y.o. male who presented with a 3 or 4 day history of worsening dyspnea and was found to have submassive pulmonary embolism and bilateral LE DVT. He has also had significant hemoptysis at the bedside. He has a history of elevated right heart pressures. His prior echo in 11/18 showed a peak right heart pressure of 62 mm Hg. His recent echo showed peak right heart of 46 mm Hg which is improved. His troponin is also negative. His RV/LV ratio is < 0.9 based on my measurements despite bilateral central pulmonary emboli.  Past Medical History:  Diagnosis Date  . Arthritis    "probably in all my joints" (12/03/2016)  . Asthma   . CAD (coronary artery disease)    a. LHC 12/04/16: 30% RCA, 50% RPDA, 25% mid LAD, 95% 1st diag, 70% 2nd diag.  . CHF (congestive heart failure), NYHA class II, chronic, combined (HCC)    12/04/16: Echo EF 20-25%, Gr 3 DD, PA peak pressure 62 mmgHg, RHC: PCPW 29 mmHg  . Eczema   . Gout    "I take RX for it" (12/03/2016)  . Heart murmur   . Hypertension   . Mitral regurgitation    12/04/16: moderate by echo  . OSA (obstructive sleep apnea)    "never could tolerate the mask" (12/03/2016)  . Pulmonary hypertension (HCC)    12/04/16: RHC: Mod PHTN, PAP 37 mmHg    Past Surgical History:  Procedure Laterality Date  . APPENDECTOMY  ~  1967  . BACK SURGERY    . COLONOSCOPY  ~ 2008  . ESOPHAGOGASTRODUODENOSCOPY  ~ 2008  . INGUINAL HERNIA REPAIR Right ~ 1976  . LUMBAR DISC SURGERY  1991; 1993   "[name] did them both"  . RIGHT/LEFT HEART CATH AND CORONARY ANGIOGRAPHY N/A  12/04/2016   Procedure: RIGHT/LEFT HEART CATH AND CORONARY ANGIOGRAPHY;  Surgeon: [place], Peter M, MD;  Location: [facility] INVASIVE CV LAB;  Service: Cardiovascular;  Laterality: N/A;    Allergies: Patient has no known allergies.  Medications: Prior to Admission medications   Medication Sig Start Date End Date Taking? Authorizing Provider  albuterol (PROVENTIL HFA;VENTOLIN HFA) 108 (90 BASE) MCG/ACT inhaler Inhale 2 puffs into the lungs every 6 (six) hours as needed for wheezing.   Yes [provider]  aspirin 81 MG tablet Take 81 mg by mouth daily.   Yes [provider]  benzonatate (TESSALON) 200 MG capsule Take 1 capsule (200 mg total) by mouth 3 (three) times daily as needed for cough. 01/22/17  Yes Strader, [place] M, PA-C  Fluticasone-Salmeterol (ADVAIR) 250-50 MCG/DOSE AEPB Inhale 1 puff into the lungs every 12 (twelve) hours.   Yes [provider]  furosemide (LASIX) 20 MG tablet Take 1 tablet (20 mg total) by mouth daily. 01/01/17 04/01/17 Yes Dyann Kief, PA-C  losartan (COZAAR) 50 MG tablet Take 50 mg by mouth daily.   Yes [provider]  metoprolol succinate (TOPROL XL) 25 MG 24 hr tablet Take 0.5 tablets (12.5 mg total) by mouth daily. 01/22/17  Yes Strader, [place] M, PA-C  polyethylene glycol powder (GLYCOLAX/MIRALAX) powder  Take 17 g daily by mouth. 08/31/16  Yes [provider]  potassium chloride SA (K-DUR,KLOR-CON) 20 MEQ tablet Take 1 tablet (20 mEq total) daily by mouth. 12/07/16  Yes Berton Bon, NP     Family History  Problem Relation Age of Onset  . Heart attack Mother   . CAD Father   . Heart attack Brother     Social History   Socioeconomic History  . Marital status: Married    Spouse name: None  . Number of children: None  . Years of education: None  . Highest education level: None  Social Needs  . Financial resource strain: None  . Food insecurity - worry: None  . Food insecurity - inability: None  .  Transportation needs - medical: None  . Transportation needs - non-medical: None  Occupational History  . None  Tobacco Use  . Smoking status: Never Smoker  . Smokeless tobacco: Never Used  Substance and Sexual Activity  . Alcohol use: Yes    Alcohol/week: 12.0 oz    Types: 20 Cans of beer per week  . Drug use: No  . Sexual activity: Not Currently  Other Topics Concern  . None  Social History Narrative  . None     Review of Systems: A 12 point ROS discussed and pertinent positives are indicated in the HPI above.  All other systems are negative.  Review of Systems  Vital Signs: BP 100/84   Pulse (!) 111   Temp (!) 96.4 F (35.8 C) (Axillary)   Resp (!) 37   Ht 5\' 10"  (1.778 m)   Wt 170 lb 3.1 oz (77.2 kg)   SpO2 90%   BMI 24.42 kg/m   Physical Exam  Imaging: Dg Chest 2 View  Result Date: 02/10/2017 CLINICAL DATA:  Swelling and pain in legs. Congestive heart failure. EXAM: CHEST  2 VIEW COMPARISON:  Two-view chest x-ray 12/03/2016. FINDINGS: Heart is enlarged. There is no edema or effusion. Left lower lobe airspace disease is noted. The visualized soft tissues and bony thorax are unremarkable. IMPRESSION: 1. Cardiomegaly without failure. 2. Ill-defined left lower lobe airspace disease is concerning for early pneumonia. Electronically Signed   By: Marin Roberts M.D.   On: 02/10/2017 17:02   Ct Angio Chest Pe W Or Wo Contrast  Addendum Date: 02/11/2017   ADDENDUM REPORT: 02/11/2017 15:54 ADDENDUM: I discussed the findings of this study with Dr. Arbutus Leas at 3:52 p.m. Electronically Signed   By: Dwyane Dee M.D.   On: 02/11/2017 15:54   Result Date: 02/11/2017 CLINICAL DATA:  Short of breath, coughing up blood, evaluate for pulmonary embolism EXAM: CT ANGIOGRAPHY CHEST WITH CONTRAST TECHNIQUE: Multidetector CT imaging of the chest was performed using the standard protocol during bolus administration of intravenous contrast. Multiplanar CT image reconstructions and MIPs were  obtained to evaluate the vascular anatomy. CONTRAST:  ISOVUE-370 IOPAMIDOL (ISOVUE-370) INJECTION 76% COMPARISON:  Chest x-ray of 02/10/2017 FINDINGS: Cardiovascular: The pulmonary arteries are well opacified. There are extensive bilateral pulmonary emboli. There is a large embolus almost completely occluding the distal left main pulmonary artery extending into the branches to the upper lobe, lower lobe, and lingula. On the right a large embolus also is partially occlusive within the distal portion of the main right pulmonary artery. This thrombus also extends into the right upper lobe, right middle lobe, and right lower lobe. The RV/LV ratio is approximately 1 which implies the patient is at risk for increased morbidity and mortality. The LV  is difficult to evaluate due to poor opacification with contrast media. There is moderate cardiomegaly present. No pericardial effusion is seen. Coronary artery calcifications are noted diffusely. Mediastinum/Nodes: No mediastinal or hilar adenopathy is seen. The thyroid gland is unremarkable. No hiatal hernia is seen. Lungs/Pleura: On lung window images there is parenchymal infiltrate primarily peripherally within much of the left lower lobe and to a lesser degree within the posterosuperior aspect of the left upper lobe. Although these could represent foci of pneumonia, developing pulmonary infarction would also be a consideration. The right lung is clear. No pleural effusion is seen. Upper Abdomen: The portion of the upper abdomen that is visualized is unremarkable. There may be some atrophy of the left kidney with apparent compensatory hypertrophy of the right kidney. Also there may be a cyst emanating from the mid right kidney peripherally which is difficult to assess on this unenhanced study. Musculoskeletal: The thoracic vertebrae are in normal alignment. No compression deformity is seen. There are degenerative changes in the mid to lower thoracic spine. Review of  the MIP images confirms the above findings. IMPRESSION: 1. Extensive partially occlusive bilateral pulmonary emboli with RV/LV ratio of 1.0 implying increased morbidity mortality. Positive for acute PE with CT evidence of right heart strain (RV/LV Ratio = 1.0) consistent with at least submassive (intermediate risk) PE. The presence of right heart strain has been associated with an increased risk of morbidity and mortality. Please activate Code PE by paging [phone]. 2. Parenchymal opacity within much of the left lower lobe and a small portion of the posterosuperior left upper lobe could represent pneumonia or developing infarction. No pleural effusion. 3. Apparent partial atrophy of the left kidney with some compensatory hypertrophy of the right kidney. Low-attenuation structure emanates from the periphery the right mid kidney difficult to assess on this study. Consider ultrasound to assess further. Electronically Signed: By: Dwyane Dee M.D. On: 02/11/2017 15:47   US Venous Img Lower Bilateral  Result Date: 02/10/2017 CLINICAL DATA:  Bilateral lower extremity pain and swelling. EXAM: BILATERAL LOWER EXTREMITY VENOUS DOPPLER ULTRASOUND TECHNIQUE: Gray-scale sonography with compression, as well as color and duplex ultrasound, were performed to evaluate the deep venous system from the level of the common femoral vein through the popliteal and proximal calf veins. COMPARISON:  None FINDINGS: On the right, there is occlusive thrombus in the popliteal vein. There thrombus in the femoral vein. This is occlusive in the mid and distal segments. The thrombus extends to its confluence with the deep femoral vein. There is gastrocnemius DVT. The posterior tibial vein is occluded and noncompressible. Peroneal veins are not well seen. There is subcutaneous edema. Visualized segments of the saphenous venous system normal in caliber and compressibility. On the left, there is thrombus in the popliteal vein which is occlusive  and noncompressible. There is posterior tibial vein DVT, occlusive and noncompressible. Visualized segments of the saphenous venous system normal in caliber and compressibility. IMPRESSION: 1. Occlusive DVT in LEFT posterior tibial and popliteal veins. 2. Occlusive DVT in RIGHT posterior tibial, popliteal, and femoral veins. Electronically Signed   By: Corlis Leak M.D.   On: 02/10/2017 18:15    Labs:  CBC: Recent Labs    12/04/16 1705 01/01/17 1512 02/10/17 1958 02/11/17 0557  WBC 9.8 9.9 10.7* 12.4*  HGB 15.7 16.7 18.1* 17.9*  HCT 46.5 52.3* 54.4* 53.0*  PLT 219 245 PLATELET CLUMPS NOTED ON SMEAR, COUNT APPEARS ADEQUATE 104*    COAGS: Recent Labs    12/04/16 0918 02/10/17  1852  INR 1.13 1.43  APTT  --  35    BMP: Recent Labs    12/06/16 0346 01/01/17 1512 02/10/17 1704 02/11/17 0557  NA 138 140 135 134*  K 4.0 4.3 5.2* 5.3*  CL 106 103 100* 98*  CO2 23 27 21* 21*  GLUCOSE 116* 121* 94 94  BUN 26* 18 35* 34*  CALCIUM 9.2 10.0 9.6 9.2  CREATININE 0.97 1.12 1.09 1.05  GFRNONAA >60 >60 >60 >60  GFRAA >60 >60 >60 >60    LIVER FUNCTION TESTS: Recent Labs    02/10/17 1704  BILITOT 3.3*  AST 59*  ALT 45  ALKPHOS 70  PROT 6.8  ALBUMIN 3.8    TUMOR MARKERS: No results for input(s): AFPTM, CEA, CA199, CHROMGRNA in the last 8760 hours.  Assessment and Plan:  Submassive pulmonary embolism is present with elevated right pressures at baseline. His current markers such as troponin and RV/LV ratio are negative. In addition he already has significant hemoptysis. ECHOS pulmonary artery lysis is not recommended at this time. Consider IVC filter placement if hemoptysis worsens.  Thank you for this interesting consult.  I greatly enjoyed meeting Alyus Mofield and look forward to participating in their care.  A copy of this report was sent to the requesting provider on this date.  Electronically Signed: Searra Carnathan, ART A, MD 02/11/2017, 11:52 PM   I spent a total of 40  Minutes  in face to face in clinical consultation, greater than 50% of which was counseling/coordinating care for pulmonary emboli.

## 2017-02-11 NOTE — Progress Notes (Signed)
I went to evaluate patient He c/o of some SOB and intermittent L-sided cp Hemoptysis is less in amount and frequency CTA chest shows submassive PE Echo shows RV strain -spoke with CCM, Dr. Lacretia Nicks. Gray-->continue heparin drip, transfer for [facility] for possible IVC filter  HR 104-RR18--94/54--98 to 100% on RA -CV-RRR -Lung--bibasilar crackles  Informed patient and family of transfer and they agree.  DTat

## 2017-02-11 NOTE — Progress Notes (Signed)
*  PRELIMINARY RESULTS* Echocardiogram 2D Echocardiogram has been performed.  Stacey Drain 02/11/2017, 2:06 PM

## 2017-02-11 NOTE — Progress Notes (Signed)
ANTICOAGULATION CONSULT NOTE - Initial Consult  Pharmacy Consult for heparin Indication: pulmonary embolus  No Known Allergies  Patient Measurements: Height: 5\' 10"  (177.8 cm) Weight: 167 lb 12.3 oz (76.1 kg) IBW/kg (Calculated) : 73 HEPARIN DW (KG): 77.9  Vital Signs: Temp: 97.7 F (36.5 C) (01/22 0643) Temp Source: Oral (01/22 0643) BP: 103/82 (01/22 0643) Pulse Rate: 102 (01/22 0643)  Labs: Recent Labs    02/10/17 1704 02/10/17 1852 02/10/17 1958 02/11/17 0557  HGB  --   --  18.1* 17.9*  HCT  --   --  54.4* 53.0*  PLT  --   --  PLATELET CLUMPS NOTED ON SMEAR, COUNT APPEARS ADEQUATE 104*  APTT  --  35  --   --   LABPROT  --  17.3*  --   --   INR  --  1.43  --   --   HEPARINUNFRC  --   --   --  0.80*  CREATININE 1.09  --   --  1.05  TROPONINI <0.03  --   --   --     Estimated Creatinine Clearance: 75.3 mL/min (by C-G formula based on SCr of 1.05 mg/dL).   Medical History: Past Medical History:  Diagnosis Date  . Arthritis    "probably in all my joints" (12/03/2016)  . Asthma   . CAD (coronary artery disease)    a. LHC 12/04/16: 30% RCA, 50% RPDA, 25% mid LAD, 95% 1st diag, 70% 2nd diag.  . CHF (congestive heart failure), NYHA class II, chronic, combined (HCC)    12/04/16: Echo EF 20-25%, Gr 3 DD, PA peak pressure 62 mmgHg, RHC: PCPW 29 mmHg  . Eczema   . Gout    "I take RX for it" (12/03/2016)  . Heart murmur   . Hypertension   . Mitral regurgitation    12/04/16: moderate by echo  . OSA (obstructive sleep apnea)    "never could tolerate the mask" (12/03/2016)  . Pulmonary hypertension (HCC)    12/04/16: RHC: Mod PHTN, PAP 37 mmHg    Medications:  Med rec reviewed  Assessment: 62 yo male presented to ED with progressive shortness of breath, swelling and pain in legs. ( Especially left leg.)  Venous Ultrasound is positive for occlusion; DVT in both left and right leg. Pharmacy asked to start heparin.  Goal of Therapy:  Heparin level 0.3-0.7  units/ml Monitor platelets by anticoagulation protocol: Yes   Plan:  Decrease heparin infusion to 1200 units/hr Check anti-Xa level in 6 hours and daily while on heparin Continue to monitor H&H and platelets    Judeth Cornfield, PharmD Clinical Pharmacist 02/11/2017 7:39 AM

## 2017-02-11 NOTE — Consult Note (Signed)
PULMONARY / CRITICAL CARE MEDICINE   Name: Stephen Fleming MRN: [id number] DOB: 06-27-1955    ADMISSION DATE:  02/10/2017 CONSULTATION DATE:  02/11/2017  REFERRING MD:  Dr. Arbutus Leas  CHIEF COMPLAINT:  Dyspnea on exertion, cough, hemoptysis   HISTORY OF PRESENT ILLNESS:   62 year old male with past medical history significant for systolic heart failure, coronary artery disease, hypertension, and gout who presented to [facility] on 1/21 with worsening exertional shortness of breath over the last 2 days, leg swelling, chest pain, and cough.    At baseline patient has very little physical reserve, walks at baseline ~ 10 feet, and normally rides a scooter when shopping.  Denies fever, chills, sick contacts.    Found on ultrasound to have acute DVT in both right and left lower extremities.  Started on heparin drip and admitted to [facility] in SDU.  CTA chest showed acute submassive PE with RV/LV ratio of 1, and TTE with evidence of right heart strain.  He developed mild hemoptysis on the afternoon of 1/22, systolic BP 80-90's, however he was given his normal blood pressure medicines and lasix earlier.  There was concern that patient may need IR intervention and therefore transferred to [facility] on PCCM service.  Patient has remained stable on room air and on review of prior visits, baseline systolic BP runs in the 90's.     PAST MEDICAL HISTORY :  He  has a past medical history of Arthritis, Asthma, CAD (coronary artery disease), CHF (congestive heart failure), NYHA class II, chronic, combined (HCC), Eczema, Gout, Heart murmur, Hypertension, Mitral regurgitation, OSA (obstructive sleep apnea), and Pulmonary hypertension (HCC).  PAST SURGICAL HISTORY: He  has a past surgical history that includes Back surgery; Appendectomy (~  1967); Inguinal hernia repair (Right, ~ 1976); Lumbar disc surgery (1991; 1993); Colonoscopy (~ 2008); Esophagogastroduodenoscopy (~ 2008); and RIGHT/LEFT HEART CATH AND CORONARY ANGIOGRAPHY  (N/A, 12/04/2016).  No Known Allergies  No current facility-administered medications on file prior to encounter.    Current Outpatient Medications on File Prior to Encounter  Medication Sig  . albuterol (PROVENTIL HFA;VENTOLIN HFA) 108 (90 BASE) MCG/ACT inhaler Inhale 2 puffs into the lungs every 6 (six) hours as needed for wheezing.  Marland Kitchen aspirin 81 MG tablet Take 81 mg by mouth daily.  . benzonatate (TESSALON) 200 MG capsule Take 1 capsule (200 mg total) by mouth 3 (three) times daily as needed for cough.  . Fluticasone-Salmeterol (ADVAIR) 250-50 MCG/DOSE AEPB Inhale 1 puff into the lungs every 12 (twelve) hours.  . furosemide (LASIX) 20 MG tablet Take 1 tablet (20 mg total) by mouth daily.  Marland Kitchen losartan (COZAAR) 50 MG tablet Take 50 mg by mouth daily.  . metoprolol succinate (TOPROL XL) 25 MG 24 hr tablet Take 0.5 tablets (12.5 mg total) by mouth daily.  . polyethylene glycol powder (GLYCOLAX/MIRALAX) powder Take 17 g daily by mouth.  . potassium chloride SA (K-DUR,KLOR-CON) 20 MEQ tablet Take 1 tablet (20 mEq total) daily by mouth.   FAMILY HISTORY:  His indicated that his mother is deceased. He indicated that his father is deceased. He indicated that his brother is deceased.  SOCIAL HISTORY: He  reports that  has never smoked. he has never used smokeless tobacco. He reports that he drinks about 12.0 oz of alcohol per week. He reports that he does not use drugs.  REVIEW OF SYSTEMS:  POSITIVES IN BOLD Gen: Denies fever, chills, weight change, fatigue,  PULM: Denies shortness of breath, cough, sputum production, hemoptysis, wheezing  CV: Denies chest pain with inspiration, BLE edema, orthopnea,  GI: Denies abdominal pain, nausea, vomiting, diarrhea, hematochezia, melena, constipation, change in bowel habits - dark when constipated  GU: Denies dysuria, hematuria Heme: Denies easy bruising, bleeding Neuro: Denies headache, numbness, weakness, slurred speech, loss of memory or  consciousness  SUBJECTIVE:  C/o of mild SOB, coughs up small clot every 20 mins or so per patient  VITAL SIGNS: BP (!) 87/74 (BP Location: Left Arm)   Pulse (!) 110   Temp (!) 96.4 F (35.8 C) (Axillary)   Resp 18   Ht 5\' 10"  (1.778 m)   Wt 170 lb 3.1 oz (77.2 kg)   SpO2 99%   BMI 24.42 kg/m   INTAKE / OUTPUT: I/O last 3 completed shifts: In: 897.2 [P.O.:780; I.V.:117.2] Out: 625 [Urine:625]  PHYSICAL EXAMINATION: General: Adult male - appears fatigued, sitting up in bed in moderate distress at times HEENT: [place]/AT, patient acutely coughing up small bloods clots in my presence x 2, pupils equal round/ reactive  Neuro: fatigued, alert, oriented x 3, MAE CV: ir rrr, S4  PULM: even/labored at times, lungs bilaterally clear on right, diminished in left base, > 98% on RA- does not like nasal cannula  GI: soft, non-tender, bs active  Extremities: cool/ dry, RLE swelling > LLE, +2 ankle edema  Skin: no rashes  LABS:   BMET Recent Labs  Lab 02/10/17 1704 02/11/17 0557  NA 135 134*  K 5.2* 5.3*  CL 100* 98*  CO2 21* 21*  BUN 35* 34*  CREATININE 1.09 1.05  GLUCOSE 94 94   Electrolytes Recent Labs  Lab 02/10/17 1704 02/11/17 0557  CALCIUM 9.6 9.2   CBC Recent Labs  Lab 02/10/17 1958 02/11/17 0557  WBC 10.7* 12.4*  HGB 18.1* 17.9*  HCT 54.4* 53.0*  PLT PLATELET CLUMPS NOTED ON SMEAR, COUNT APPEARS ADEQUATE 104*   Coag's Recent Labs  Lab 02/10/17 1852  APTT 35  INR 1.43   Sepsis Markers No results for input(s): LATICACIDVEN, PROCALCITON, O2SATVEN in the last 168 hours.  ABG No results for input(s): PHART, PCO2ART, PO2ART in the last 168 hours.  Liver Enzymes Recent Labs  Lab 02/10/17 1704  AST 59*  ALT 45  ALKPHOS 70  BILITOT 3.3*  ALBUMIN 3.8   Cardiac Enzymes Recent Labs  Lab 02/10/17 1704  TROPONINI <0.03   Glucose Recent Labs  Lab 02/11/17 2032  GLUCAP 119*   Imaging Ct Angio Chest Pe W Or Wo Contrast  Addendum Date: 02/11/2017    ADDENDUM REPORT: 02/11/2017 15:54 ADDENDUM: I discussed the findings of this study with Dr. Arbutus Leas at 3:52 p.m. Electronically Signed   By: Dwyane Dee M.D.   On: 02/11/2017 15:54   Result Date: 02/11/2017 CLINICAL DATA:  Short of breath, coughing up blood, evaluate for pulmonary embolism EXAM: CT ANGIOGRAPHY CHEST WITH CONTRAST TECHNIQUE: Multidetector CT imaging of the chest was performed using the standard protocol during bolus administration of intravenous contrast. Multiplanar CT image reconstructions and MIPs were obtained to evaluate the vascular anatomy. CONTRAST:  ISOVUE-370 IOPAMIDOL (ISOVUE-370) INJECTION 76% COMPARISON:  Chest x-ray of 02/10/2017 FINDINGS: Cardiovascular: The pulmonary arteries are well opacified. There are extensive bilateral pulmonary emboli. There is a large embolus almost completely occluding the distal left main pulmonary artery extending into the branches to the upper lobe, lower lobe, and lingula. On the right a large embolus also is partially occlusive within the distal portion of the main right pulmonary artery. This thrombus also extends into  the right upper lobe, right middle lobe, and right lower lobe. The RV/LV ratio is approximately 1 which implies the patient is at risk for increased morbidity and mortality. The LV is difficult to evaluate due to poor opacification with contrast media. There is moderate cardiomegaly present. No pericardial effusion is seen. Coronary artery calcifications are noted diffusely. Mediastinum/Nodes: No mediastinal or hilar adenopathy is seen. The thyroid gland is unremarkable. No hiatal hernia is seen. Lungs/Pleura: On lung window images there is parenchymal infiltrate primarily peripherally within much of the left lower lobe and to a lesser degree within the posterosuperior aspect of the left upper lobe. Although these could represent foci of pneumonia, developing pulmonary infarction would also be a consideration. The right lung is  clear. No pleural effusion is seen. Upper Abdomen: The portion of the upper abdomen that is visualized is unremarkable. There may be some atrophy of the left kidney with apparent compensatory hypertrophy of the right kidney. Also there may be a cyst emanating from the mid right kidney peripherally which is difficult to assess on this unenhanced study. Musculoskeletal: The thoracic vertebrae are in normal alignment. No compression deformity is seen. There are degenerative changes in the mid to lower thoracic spine. Review of the MIP images confirms the above findings. IMPRESSION: 1. Extensive partially occlusive bilateral pulmonary emboli with RV/LV ratio of 1.0 implying increased morbidity mortality. Positive for acute PE with CT evidence of right heart strain (RV/LV Ratio = 1.0) consistent with at least submassive (intermediate risk) PE. The presence of right heart strain has been associated with an increased risk of morbidity and mortality. Please activate Code PE by paging [phone]. 2. Parenchymal opacity within much of the left lower lobe and a small portion of the posterosuperior left upper lobe could represent pneumonia or developing infarction. No pleural effusion. 3. Apparent partial atrophy of the left kidney with some compensatory hypertrophy of the right kidney. Low-attenuation structure emanates from the periphery the right mid kidney difficult to assess on this study. Consider ultrasound to assess further. Electronically Signed: By: Dwyane Dee M.D. On: 02/11/2017 15:47   STUDIES:  1/21 Bilateral Venous US >>  1. Occlusive DVT in LEFT posterior tibial and popliteal veins. 2. Occlusive DVT in RIGHT posterior tibial, popliteal, and femoral Veins.  1/22 CTA chest >>  1.  Extensive partially occlusive bilateral pulmonary emboli with RV/LV ratio of 1.0 implying increased morbidity mortality. Positive for acute PE with CT evidence of right heart strain (RV/LV Ratio = 1.0) consistent with at  least submassive (intermediate risk) PE.  2.  Parenchymal opacity within much of the left lower lobe and a small portion of the posterosuperior left upper lobe could represent pneumonia or developing infarction. No pleural effusion. 3.  Apparent partial atrophy of the left kidney with some compensatory hypertrophy of the right kidney. Low-attenuation structure emanates from the periphery the right mid kidney difficult to assess on this study.  1/22 TTE >>  LVEF 10-15%; moderately dilated LV, diffuse hypokinesis, akinesis of basal mid anterior septal myocardium, mild AR, mild MV calcified annulus and mild MR; LA severely dilated, RV septum flattend in systole and diastole c/w RV pressure and volume overload with moderately reduced systolic function, a semimobile linear structure in the right, atrium that likely represents a thrombus,  atrium was moderately dilated, no PFO or defect; mod TR, PAP 46 mm Hg; IVC dilated with blunted respirophasic changes  CULTURES: 1/22 MRSA PCR >>  ANTIBIOTICS: none  SIGNIFICANT EVENTS: 1/21 Admit [facility] 1/22  Tx to [facility]  LINES/TUBES: PIVs x 2   DISCUSSION: 29 yoM with PMH of systolic HF, HLD, and recent reduction in activity tolerance since October 2018 presenting with chest pain, worsening dyspnea on exertion, and BLE swelling since 1/17 found to have acute BLE DVTs and submassive PE with acute right heart strain.  Patient has been mostly at his baseline with hemodynamics and stable on room air.    ASSESSMENT / PLAN:  PULMONARY A: Submassive PE with acute right heart strain - likely from acute reduction in activity since 10/2016. LLL atelectasis- likely pulmonary infarct  Hemoptysis  P:   ICU monitoring High risk for intubation  Continue heparin gtt for now; hold if hemoptysis progresses Consult IR for EKOS Additionally, he has significant clot burden in BLE, may not be able to tolerate  O2 prn for sats > 94% or for dyspnea  ABG prn    CARDIOVASCULAR A:  Systolic HF - LVEF from 20-25% (11/2016)/ PAP 62 mm Hg  - 1/22 TTE LVEF 10-15%/ PAP 47 mm Hg Acute PE with Right Heart strain and acute BLE DVT Hx HTN, CAD, mod MR - previous LHC 12/04/2016 thought not severe enough to explain LV dysfunction, possibly due to chronic ETOH use P:  Tele monitoring Goal MAP > 65 Hold lasix and HTN as patient is pre-load dependent  Appreciate Cards following Trend troponin  Goal K > 4/ Mag >2 See above Continue heparin gtt  RENAL A:   Mild hyperkalemia P:   Mag now Trend BMP / mag/ urinary output/ daily weights Replace electrolytes as indicated Avoid nephrotoxic agents, ensure adequate renal perfusion  GASTROINTESTINAL A:   Mild Transaminitis Hx ETOH- denies use since Oct 2018  P:   NPO in ancipitation for IR procedure Does not meet SUP criteria  Trend LFTs  HEMATOLOGIC A:   Polycythemia Thrombocytopenia  Acute DVT BLE P:  Monitor for bleeding/ increased hemoptysis  Continue systemic heparin gtt for now; hold if hemoptysis significantly increases Trend CBC NO SCDs  INFECTIOUS A:   Mild leukocytosis- likely reactive - LLL atelectasis likely pulmonary infarct P:   Trend WBC/ fever curve Monitor clinically  ENDOCRINE A:   No acute issues  P:   Trend glucose on BMET  NEUROLOGIC A:   Hx of ETOH - patient reports he stopped drinking 10/2016 P:   Monitor  FAMILY  - Updates: Patient and family member updated at bedside by Dr. Merlene Pulling.  Patient states his wife can make decisions for him, but as for now, he is agreeable to aggressive measures including intubation, CPR, EKOS, systemic thrombolytics if needed.    - Inter-disciplinary family meet or Palliative Care meeting due by:  02/18/2017  CCT 60 mins  Posey Boyer, AGACNP-BC [place] [facility] Pgr: [phone] or if no answer [phone] 02/11/2017, 10:19 PM   Attending Addendum: I personally examined this patient and agree with plan  as detailed by APP note. Briefly, this is a 61yoM with CHF (EF 20%), CAD, PHTN, OSA, who is admitted with a submassive PE with large clot burden bilaterally, especially within the distal left main PA, with significant left lung infarct related to the PE. He was started on heparin gtt. He started having small volume hemoptysis and increased work of breathing. He was transferred from [facility] to [facility] for possible IR intervention. At the time of my exam patient is in respiratory distress with accessory muscle use and saying he feels like he is "tiring out." He is acting to  be intubated. There are no breath sounds on the left; CTA on the right. Have consulted IR (Dr Bonnielee Haff) who is on his way in to examine patient and eval for possible catheter directed TPA. Will plan to intubate patient shortly given his respiratory distress.   60 minutes critical care time  Milana Obey, MD Pulmonary & Critical Care Medicine Pager: [phone]

## 2017-02-11 NOTE — Progress Notes (Signed)
ANTICOAGULATION CONSULT NOTE - Initial Consult  Pharmacy Consult for heparin Indication: pulmonary embolus  No Known Allergies  Patient Measurements: Height: 5\' 10"  (177.8 cm) Weight: 167 lb 12.3 oz (76.1 kg) IBW/kg (Calculated) : 73 HEPARIN DW (KG): 77.9  Vital Signs: Temp: 98.3 F (36.8 C) (01/22 1307) Temp Source: Oral (01/22 0643) BP: 94/71 (01/22 1307) Pulse Rate: 105 (01/22 1307)  Labs: Recent Labs    02/10/17 1704 02/10/17 1852 02/10/17 1958 02/11/17 0557 02/11/17 1301  HGB  --   --  18.1* 17.9*  --   HCT  --   --  54.4* 53.0*  --   PLT  --   --  PLATELET CLUMPS NOTED ON SMEAR, COUNT APPEARS ADEQUATE 104*  --   APTT  --  35  --   --   --   LABPROT  --  17.3*  --   --   --   INR  --  1.43  --   --   --   HEPARINUNFRC  --   --   --  0.80* 0.74*  CREATININE 1.09  --   --  1.05  --   TROPONINI <0.03  --   --   --   --     Estimated Creatinine Clearance: 75.3 mL/min (by C-G formula based on SCr of 1.05 mg/dL).   Medical History: Past Medical History:  Diagnosis Date  . Arthritis    "probably in all my joints" (12/03/2016)  . Asthma   . CAD (coronary artery disease)    a. LHC 12/04/16: 30% RCA, 50% RPDA, 25% mid LAD, 95% 1st diag, 70% 2nd diag.  . CHF (congestive heart failure), NYHA class II, chronic, combined (HCC)    12/04/16: Echo EF 20-25%, Gr 3 DD, PA peak pressure 62 mmgHg, RHC: PCPW 29 mmHg  . Eczema   . Gout    "I take RX for it" (12/03/2016)  . Heart murmur   . Hypertension   . Mitral regurgitation    12/04/16: moderate by echo  . OSA (obstructive sleep apnea)    "never could tolerate the mask" (12/03/2016)  . Pulmonary hypertension (HCC)    12/04/16: RHC: Mod PHTN, PAP 37 mmHg    Medications:  Med rec reviewed  Assessment: 62 yo male presented to ED with progressive shortness of breath, swelling and pain in legs. ( Especially left leg.)  Venous Ultrasound is positive for occlusion; DVT in both left and right leg. Pharmacy asked to start  heparin.  Goal of Therapy:  Heparin level 0.3-0.7 units/ml Monitor platelets by anticoagulation protocol: Yes   Plan:  Decrease heparin infusion to 1100 units/hr Check anti-Xa level in 6 hours (2030) and daily while on heparin Continue to monitor H&H and platelets    Judeth Cornfield, PharmD Clinical Pharmacist 02/11/2017 2:32 PM

## 2017-02-11 NOTE — Progress Notes (Signed)
CT angio completed, text-paged Dr. Arbutus Leas to notify results are ready for his review. Earnstine Regal, RN

## 2017-02-11 NOTE — H&P (Signed)
[facility] History and Physical  Kieler Inscoe TWS:[id number] DOB: 18-Jun-1955 DOA: 02/10/2017  Referring physician:  PCP: Louie Boston., MD   Chief Complaint: "I can't walk at all."  HPI: Stephen Fleming is a 62 y.o. male with past medical history significant for coronary artery disease, CHF, gout, hypertension presents to the emergency room with a chief complaint of dyspnea on exertion.  Patient states that over the last 2 days he has been more tired than normal.  Also had some leg swelling.  No family history of blood clots no personal history of blood clots. Patient has very little physical reserve being able to move around his house.  Usually rides a scooter when shopping.  Now he cannot even get across a short distance of 10 feet.  Confers some chest pain and cough.  No fevers or chills.  No sick contacts.  No recent upper respiratory tract infection.  Course: Ultrasound of lower extremities revealed blood clots.  Started on heparin.  Review of Systems:  As per HPI otherwise 10 point review of systems negative.    Past Medical History:  Diagnosis Date  . Arthritis    "probably in all my joints" (12/03/2016)  . Asthma   . CAD (coronary artery disease)    a. LHC 12/04/16: 30% RCA, 50% RPDA, 25% mid LAD, 95% 1st diag, 70% 2nd diag.  . CHF (congestive heart failure), NYHA class II, chronic, combined (HCC)    12/04/16: Echo EF 20-25%, Gr 3 DD, PA peak pressure 62 mmgHg, RHC: PCPW 29 mmHg  . Eczema   . Gout    "I take RX for it" (12/03/2016)  . Heart murmur   . Hypertension   . Mitral regurgitation    12/04/16: moderate by echo  . OSA (obstructive sleep apnea)    "never could tolerate the mask" (12/03/2016)  . Pulmonary hypertension (HCC)    12/04/16: RHC: Mod PHTN, PAP 37 mmHg   Past Surgical History:  Procedure Laterality Date  . APPENDECTOMY  ~  1967  . BACK SURGERY    . COLONOSCOPY  ~ 2008  . ESOPHAGOGASTRODUODENOSCOPY  ~ 2008  . INGUINAL HERNIA REPAIR Right ~  1976  . LUMBAR DISC SURGERY  1991; 1993   "[name] did them both"  . RIGHT/LEFT HEART CATH AND CORONARY ANGIOGRAPHY N/A 12/04/2016   Procedure: RIGHT/LEFT HEART CATH AND CORONARY ANGIOGRAPHY;  Surgeon: [place], Peter M, MD;  Location: [facility] INVASIVE CV LAB;  Service: Cardiovascular;  Laterality: N/A;   Social History:  reports that  has never smoked. he has never used smokeless tobacco. He reports that he drinks about 12.0 oz of alcohol per week. He reports that he does not use drugs.  No Known Allergies  Family History  Problem Relation Age of Onset  . Heart attack Mother   . CAD Father   . Heart attack Brother      Prior to Admission medications   Medication Sig Start Date End Date Taking? Authorizing Provider  albuterol (PROVENTIL HFA;VENTOLIN HFA) 108 (90 BASE) MCG/ACT inhaler Inhale 2 puffs into the lungs every 6 (six) hours as needed for wheezing.   Yes [provider]  aspirin 81 MG tablet Take 81 mg by mouth daily.   Yes [provider]  benzonatate (TESSALON) 200 MG capsule Take 1 capsule (200 mg total) by mouth 3 (three) times daily as needed for cough. 01/22/17  Yes Strader, [place] M, PA-C  Fluticasone-Salmeterol (ADVAIR) 250-50 MCG/DOSE AEPB Inhale 1 puff into the lungs  every 12 (twelve) hours.   Yes [provider]  furosemide (LASIX) 20 MG tablet Take 1 tablet (20 mg total) by mouth daily. 01/01/17 04/01/17 Yes Dyann Kief, PA-C  losartan (COZAAR) 50 MG tablet Take 50 mg by mouth daily.   Yes [provider]  metoprolol succinate (TOPROL XL) 25 MG 24 hr tablet Take 0.5 tablets (12.5 mg total) by mouth daily. 01/22/17  Yes Strader, [place] M, PA-C  polyethylene glycol powder (GLYCOLAX/MIRALAX) powder Take 17 g daily by mouth. 08/31/16  Yes [provider]  potassium chloride SA (K-DUR,KLOR-CON) 20 MEQ tablet Take 1 tablet (20 mEq total) daily by mouth. 12/07/16  Yes Berton Bon, NP   Physical Exam: Vitals:   02/10/17  1830 02/10/17 1900 02/10/17 1939 02/10/17 2333  BP: 107/88 (!) 115/95 104/83 104/79  Pulse: 97  99 (!) 104  Resp: (!) 25 (!) 25 18 18   Temp:    (!) 97.4 F (36.3 C)  TempSrc:    Oral  SpO2: 100%  100% 100%  Weight:    76.1 kg (167 lb 12.3 oz)  Height:        Wt Readings from Last 3 Encounters:  02/10/17 76.1 kg (167 lb 12.3 oz)  01/22/17 80.7 kg (178 lb)  01/01/17 82.1 kg (181 lb)    General:  Appears calm and comfortable; A&Ox3 Eyes:  PERRL, EOMI, normal lids, iris ENT:  grossly normal hearing, lips & tongue Neck:  no LAD, masses or thyromegaly Cardiovascular:  RRR, no m/r/g. R calf swelling and ankle edema Respiratory:  Crackle L basilar. Mild tachypnea. Abdomen:  soft, ntnd Skin:  no rash or induration seen on limited exam Musculoskeletal:  grossly normal tone BUE/BLE Psychiatric:  grossly normal mood and affect, speech fluent and appropriate Neurologic:  CN 2-12 grossly intact, moves all extremities in coordinated fashion.          Labs on Admission:  Basic Metabolic Panel: Recent Labs  Lab 02/10/17 1704  NA 135  K 5.2*  CL 100*  CO2 21*  GLUCOSE 94  BUN 35*  CREATININE 1.09  CALCIUM 9.6   Liver Function Tests: Recent Labs  Lab 02/10/17 1704  AST 59*  ALT 45  ALKPHOS 70  BILITOT 3.3*  PROT 6.8  ALBUMIN 3.8   No results for input(s): LIPASE, AMYLASE in the last 168 hours. No results for input(s): AMMONIA in the last 168 hours. CBC: Recent Labs  Lab 02/10/17 1958  WBC 10.7*  NEUTROABS 7.6  HGB 18.1*  HCT 54.4*  MCV 94.8  PLT PLATELET CLUMPS NOTED ON SMEAR, COUNT APPEARS ADEQUATE   Cardiac Enzymes: Recent Labs  Lab 02/10/17 1704  TROPONINI <0.03    BNP (last 3 results) Recent Labs    12/04/16 0030  BNP 1,027.4*    ProBNP (last 3 results) No results for input(s): PROBNP in the last 8760 hours.   Serum creatinine: 1.09 mg/dL 29/52/84 1324 Estimated creatinine clearance: 72.6 mL/min  CBG: No results for input(s): GLUCAP in the  last 168 hours.  Radiological Exams on Admission: Dg Chest 2 View  Result Date: 02/10/2017 CLINICAL DATA:  Swelling and pain in legs. Congestive heart failure. EXAM: CHEST  2 VIEW COMPARISON:  Two-view chest x-ray 12/03/2016. FINDINGS: Heart is enlarged. There is no edema or effusion. Left lower lobe airspace disease is noted. The visualized soft tissues and bony thorax are unremarkable. IMPRESSION: 1. Cardiomegaly without failure. 2. Ill-defined left lower lobe airspace disease is concerning for early pneumonia. Electronically Signed  By: Marin Roberts M.D.   On: 02/10/2017 17:02   US Venous Img Lower Bilateral  Result Date: 02/10/2017 CLINICAL DATA:  Bilateral lower extremity pain and swelling. EXAM: BILATERAL LOWER EXTREMITY VENOUS DOPPLER ULTRASOUND TECHNIQUE: Gray-scale sonography with compression, as well as color and duplex ultrasound, were performed to evaluate the deep venous system from the level of the common femoral vein through the popliteal and proximal calf veins. COMPARISON:  None FINDINGS: On the right, there is occlusive thrombus in the popliteal vein. There thrombus in the femoral vein. This is occlusive in the mid and distal segments. The thrombus extends to its confluence with the deep femoral vein. There is gastrocnemius DVT. The posterior tibial vein is occluded and noncompressible. Peroneal veins are not well seen. There is subcutaneous edema. Visualized segments of the saphenous venous system normal in caliber and compressibility. On the left, there is thrombus in the popliteal vein which is occlusive and noncompressible. There is posterior tibial vein DVT, occlusive and noncompressible. Visualized segments of the saphenous venous system normal in caliber and compressibility. IMPRESSION: 1. Occlusive DVT in LEFT posterior tibial and popliteal veins. 2. Occlusive DVT in RIGHT posterior tibial, popliteal, and femoral veins. Electronically Signed   By: Corlis Leak M.D.   On:  02/10/2017 18:15    EKG: Independently reviewed. LBBB (old), tachycardia.  Assessment/Plan Active Problems:   Dyspnea  Dyspnea due to presumed PE & known DVTs Cont heparin ECHO in AM Hypercoag panel ordered  Mild hyperkalemia Bolus NS Recheck in AM No EKG abn from K  Hypertension When necessary hydralazine 10 mg IV as needed for severe blood pressure  Asthma  Cont Advair->Dulera bid Prn albuterol  CHF Resume lasix in AM Cont losartan, toprol, potassium  OSA RT consult  Code Status: FC  DVT Prophylaxis: heparin Family Communication: none available Disposition Plan: Pending Improvement  Status: tele obs  Haydee Salter, MD Family Medicine [facility] [email or web address] Password [email or web address]

## 2017-02-11 NOTE — Progress Notes (Signed)
Patient reports coughing up blood every time he coughs. Stated "it started around 1am." several bloody tissues at bedside on assessment, with a few quarter sized clots noted. Heparin drip decreased to 1200 units/hr this am as ordered per pharmacy dose change. Text-paged Dr. Arbutus Leas to notify. Earnstine Regal, RN

## 2017-02-11 NOTE — Progress Notes (Signed)
ANTICOAGULATION CONSULT NOTE Pharmacy Consult for heparin Indication: pulmonary embolus  No Known Allergies  Patient Measurements: Height: 5\' 10"  (177.8 cm) Weight: 170 lb 3.1 oz (77.2 kg) IBW/kg (Calculated) : 73 HEPARIN DW (KG): 77.2  Vital Signs: Temp: 96.4 F (35.8 C) (01/22 2000) Temp Source: Axillary (01/22 2000) BP: 100/84 (01/22 2100) Pulse Rate: 111 (01/22 2100)  Labs: Recent Labs    02/10/17 1704 02/10/17 1852 02/10/17 1958 02/11/17 0557 02/11/17 1301 02/11/17 2222  HGB  --   --  18.1* 17.9*  --   --   HCT  --   --  54.4* 53.0*  --   --   PLT  --   --  PLATELET CLUMPS NOTED ON SMEAR, COUNT APPEARS ADEQUATE 104*  --   --   APTT  --  35  --   --   --   --   LABPROT  --  17.3*  --   --   --   --   INR  --  1.43  --   --   --   --   HEPARINUNFRC  --   --   --  0.80* 0.74* 0.59  CREATININE 1.09  --   --  1.05  --   --   TROPONINI <0.03  --   --   --   --   --     Estimated Creatinine Clearance: 75.3 mL/min (by C-G formula based on SCr of 1.05 mg/dL).  Assessment: 62 y.o. male with PE/DVT for heparin.  Repeat heparin level within therapeutic range  Goal of Therapy:  Heparin level 0.3-0.7 units/ml Monitor platelets by anticoagulation protocol: Yes   Plan:  Continue Heparin at current rate  Geannie Risen, PharmD, BCPS  02/11/2017 11:45 PM

## 2017-02-11 NOTE — Progress Notes (Signed)
CT angio of chest ordered. Spoke with Corrie Dandy in CT to determine estimate of when it will be completed. Stated they will attempt for within the next hour. Text-paged Dr. Arbutus Leas to notify. Earnstine Regal, RN

## 2017-02-11 NOTE — Telephone Encounter (Signed)
Pt currently admitted. Randall An, PA spoke with wife today.

## 2017-02-11 NOTE — Progress Notes (Signed)
Patient to be transferred to [facility] - Medical ICU room 16C. Notified [facility], stated they will have team dispatched. Report given to [facility] nurse at [place], stated they should arrive in about 20-25 minutes. Earnstine Regal, RN

## 2017-02-11 NOTE — Progress Notes (Signed)
PROGRESS NOTE  Stephen Fleming LYY:[id number] DOB: 1955/07/11 DOA: 02/10/2017 PCP: Louie Boston., MD  Brief History:  62 year old male with a history of systolic and diastolic CHF, hypertension, coronary artery disease, OSA presenting with 4-day history of left-sided chest discomfort and shortness of breath.  During the same period, the patient also complained of worsening leg pain.  He stated his leg edema was about the same as usual.  He stated that his chest pain was sharp in nature and worsening with cough.  He denied any fevers, chills, nausea, vomiting, diarrhea, abdominal pain, dysuria, hematuria. In the emergency department, the patient was afebrile hemodynamically stable saturating 100% on room air.  Venous duplex of the lower extremity showed bilateral DVTs.  The patient was started on IV heparin.  Assessment/Plan: Bilateral lower extremity DVTs -Continue IV heparin -vitals at 1030  --HR105--RR18--101/80--100%RA  Hemoptysis -With the patient's chest pain and shortness of breath, high clinical suspicion for pulmonary embolus -CT angiogram chest -monitor Hgb -If hemoptysis worsens, may need to stop IV heparin  -Echocardiogram  Hyperkalemia -Discontinue losartan -Discontinue potassium supplementation -Repeat potassium later today as the patient received 1 L normal saline  Chronic systolic and diastolic CHF -Appears clinically euvolemic -Daily weights -Continue metoprolol succinate as blood pressure allows -Continue furosemide at home dose -12/04/2016 echo EF 20-25%, grade 3 DD, moderate MR, PASP 62  Syncope -Event monitor was placed on 01/30/2017 -The patient's carvedilol was changed to metoprolol succinate and losartan dose was decreased secondary to soft blood pressures -Consult cardiology  Coronary artery disease -cath in 11/2016 showing 60% first diagonal, 95% ostial small OM1, 70% OM2, and 50% mid PDA -Suspect current chest pain is secondary to  PE -Consult cardiology  Essential hypertension -Holding losartan secondary to soft blood pressure -Continue low-dose metoprolol succinate  Alcohol dependence -In remission -No alcohol since November 2019 admission -Previously drank 3 x 24 ounce beers daily x 40 yrs    Disposition Plan:   Home in 2-3 days  Family Communication:   Spouse updated at bedside 1/22  Consultants:  cardiology  Code Status:  FULL   DVT Prophylaxis:  IV Heparin    Procedures: As Listed in Progress Note Above  Antibiotics: None    Subjective: Patient complains of hemoptysis.  Denies any shortness of breath but complains of chest pain worsening with coughing.  Denies any nausea, vomiting, diarrhea, abdominal pain, dysuria, hematuria.  Objective: Vitals:   02/10/17 1939 02/10/17 2333 02/11/17 0643 02/11/17 0853  BP: 104/83 104/79 103/82   Pulse: 99 (!) 104 (!) 102   Resp: 18 18 18    Temp:  (!) 97.4 F (36.3 C) 97.7 F (36.5 C)   TempSrc:  Oral Oral   SpO2: 100% 100% 99% 95%  Weight:  76.1 kg (167 lb 12.3 oz)    Height:        Intake/Output Summary (Last 24 hours) at 02/11/2017 1041 Last data filed at 02/11/2017 0814 Gross per 24 hour  Intake 663 ml  Output 225 ml  Net 438 ml   Weight change:  Exam:   General:  Pt is alert, follows commands appropriately, not in acute distress  HEENT: No icterus, No thrush, No neck mass, [place]/AT  Cardiovascular: RRR, S1/S2, no rubs, no gallops  Respiratory: Left basilar crackles.  Right clear to auscultation.  Abdomen: Soft/+BS, non tender, non distended, no guarding  Extremities: 1 +LE edema, No lymphangitis, No petechiae, No rashes, no synovitis   Data  Reviewed: I have personally reviewed following labs and imaging studies Basic Metabolic Panel: Recent Labs  Lab 02/10/17 1704 02/11/17 0557  NA 135 134*  K 5.2* 5.3*  CL 100* 98*  CO2 21* 21*  GLUCOSE 94 94  BUN 35* 34*  CREATININE 1.09 1.05  CALCIUM 9.6 9.2   Liver Function  Tests: Recent Labs  Lab 02/10/17 1704  AST 59*  ALT 45  ALKPHOS 70  BILITOT 3.3*  PROT 6.8  ALBUMIN 3.8   No results for input(s): LIPASE, AMYLASE in the last 168 hours. No results for input(s): AMMONIA in the last 168 hours. Coagulation Profile: Recent Labs  Lab 02/10/17 1852  INR 1.43   CBC: Recent Labs  Lab 02/10/17 1958 02/11/17 0557  WBC 10.7* 12.4*  NEUTROABS 7.6  --   HGB 18.1* 17.9*  HCT 54.4* 53.0*  MCV 94.8 94.6  PLT PLATELET CLUMPS NOTED ON SMEAR, COUNT APPEARS ADEQUATE 104*   Cardiac Enzymes: Recent Labs  Lab 02/10/17 1704  TROPONINI <0.03   BNP: Invalid input(s): POCBNP CBG: No results for input(s): GLUCAP in the last 168 hours. HbA1C: No results for input(s): HGBA1C in the last 72 hours. Urine analysis:    Component Value Date/Time   COLORURINE YELLOW 06/18/2015 1720   APPEARANCEUR CLEAR 06/18/2015 1720   LABSPEC 1.015 06/18/2015 1720   PHURINE 6.0 06/18/2015 1720   GLUCOSEU NEGATIVE 06/18/2015 1720   HGBUR NEGATIVE 06/18/2015 1720   BILIRUBINUR NEGATIVE 06/18/2015 1720   KETONESUR NEGATIVE 06/18/2015 1720   PROTEINUR NEGATIVE 06/18/2015 1720   NITRITE NEGATIVE 06/18/2015 1720   LEUKOCYTESUR NEGATIVE 06/18/2015 1720   Sepsis Labs: @LABRCNTIP (procalcitonin:4,lacticidven:4) )No results found for this or any previous visit (from the past 240 hour(s)).   Scheduled Meds: . aspirin EC  81 mg Oral Daily  . feeding supplement (ENSURE ENLIVE)  237 mL Oral BID BM  . furosemide  20 mg Oral Daily  . HYDROcodone-homatropine  5 mL Oral Once  . metoprolol succinate  12.5 mg Oral Daily  . mometasone-formoterol  2 puff Inhalation BID  . sodium chloride flush  3 mL Intravenous Q12H   Continuous Infusions: . heparin 1,200 Units/hr (02/11/17 1610)    Procedures/Studies: Dg Chest 2 View  Result Date: 02/10/2017 CLINICAL DATA:  Swelling and pain in legs. Congestive heart failure. EXAM: CHEST  2 VIEW COMPARISON:  Two-view chest x-ray 12/03/2016.  FINDINGS: Heart is enlarged. There is no edema or effusion. Left lower lobe airspace disease is noted. The visualized soft tissues and bony thorax are unremarkable. IMPRESSION: 1. Cardiomegaly without failure. 2. Ill-defined left lower lobe airspace disease is concerning for early pneumonia. Electronically Signed   By: Marin Roberts M.D.   On: 02/10/2017 17:02   US Venous Img Lower Bilateral  Result Date: 02/10/2017 CLINICAL DATA:  Bilateral lower extremity pain and swelling. EXAM: BILATERAL LOWER EXTREMITY VENOUS DOPPLER ULTRASOUND TECHNIQUE: Gray-scale sonography with compression, as well as color and duplex ultrasound, were performed to evaluate the deep venous system from the level of the common femoral vein through the popliteal and proximal calf veins. COMPARISON:  None FINDINGS: On the right, there is occlusive thrombus in the popliteal vein. There thrombus in the femoral vein. This is occlusive in the mid and distal segments. The thrombus extends to its confluence with the deep femoral vein. There is gastrocnemius DVT. The posterior tibial vein is occluded and noncompressible. Peroneal veins are not well seen. There is subcutaneous edema. Visualized segments of the saphenous venous system normal in caliber and  compressibility. On the left, there is thrombus in the popliteal vein which is occlusive and noncompressible. There is posterior tibial vein DVT, occlusive and noncompressible. Visualized segments of the saphenous venous system normal in caliber and compressibility. IMPRESSION: 1. Occlusive DVT in LEFT posterior tibial and popliteal veins. 2. Occlusive DVT in RIGHT posterior tibial, popliteal, and femoral veins. Electronically Signed   By: Corlis Leak M.D.   On: 02/10/2017 18:15    Catarina Hartshorn, DO  [facility] Pager [phone]  If 7PM-7AM, please contact night-coverage [email or web address] Password [email or web address] 02/11/2017, 10:41 AM   LOS: 0 days

## 2017-02-12 DIAGNOSIS — I2699 Other pulmonary embolism without acute cor pulmonale: Principal | ICD-10-CM

## 2017-02-12 DIAGNOSIS — I5043 Acute on chronic combined systolic (congestive) and diastolic (congestive) heart failure: Secondary | ICD-10-CM

## 2017-02-12 DIAGNOSIS — I2602 Saddle embolus of pulmonary artery with acute cor pulmonale: Secondary | ICD-10-CM

## 2017-02-12 LAB — POCT I-STAT 3, VENOUS BLOOD GAS (G3P V)
ACID-BASE DEFICIT: 1 mmol/L (ref 0.0–2.0)
BICARBONATE: 22.8 mmol/L (ref 20.0–28.0)
O2 Saturation: 86 %
PH VEN: 7.454 — AB (ref 7.250–7.430)
Patient temperature: 96.3
TCO2: 24 mmol/L (ref 22–32)
pCO2, Ven: 32.1 mmHg — ABNORMAL LOW (ref 44.0–60.0)
pO2, Ven: 44 mmHg (ref 32.0–45.0)

## 2017-02-12 LAB — HEPATIC FUNCTION PANEL
ALT: 35 U/L (ref 17–63)
AST: 40 U/L (ref 15–41)
Albumin: 3.3 g/dL — ABNORMAL LOW (ref 3.5–5.0)
Alkaline Phosphatase: 63 U/L (ref 38–126)
BILIRUBIN INDIRECT: 1.5 mg/dL — AB (ref 0.3–0.9)
BILIRUBIN TOTAL: 2.8 mg/dL — AB (ref 0.3–1.2)
Bilirubin, Direct: 1.3 mg/dL — ABNORMAL HIGH (ref 0.1–0.5)
Total Protein: 6.2 g/dL — ABNORMAL LOW (ref 6.5–8.1)

## 2017-02-12 LAB — RENAL FUNCTION PANEL
ALBUMIN: 3.3 g/dL — AB (ref 3.5–5.0)
ANION GAP: 12 (ref 5–15)
BUN: 31 mg/dL — AB (ref 6–20)
CO2: 22 mmol/L (ref 22–32)
CREATININE: 1.1 mg/dL (ref 0.61–1.24)
Calcium: 9.1 mg/dL (ref 8.9–10.3)
Chloride: 100 mmol/L — ABNORMAL LOW (ref 101–111)
GFR calc Af Amer: >60 mL/min (ref >60)
GFR calc non Af Amer: >60 mL/min (ref >60)
GLUCOSE: 123 mg/dL — AB (ref 65–99)
PHOSPHORUS: 2.9 mg/dL (ref 2.5–4.6)
Potassium: 5 mmol/L (ref 3.5–5.1)
SODIUM: 134 mmol/L — AB (ref 135–145)

## 2017-02-12 LAB — HOMOCYSTEINE: Homocysteine: 18.3 umol/L — ABNORMAL HIGH (ref 0.0–15.0)

## 2017-02-12 LAB — HEPARIN LEVEL (UNFRACTIONATED)
HEPARIN UNFRACTIONATED: 0.23 [IU]/mL — AB (ref 0.30–0.70)
Heparin Unfractionated: 0.61 IU/mL (ref 0.30–0.70)

## 2017-02-12 LAB — HEMOGLOBIN AND HEMATOCRIT, BLOOD
HCT: 51.9 % (ref 39.0–52.0)
HEMOGLOBIN: 17.7 g/dL — AB (ref 13.0–17.0)

## 2017-02-12 LAB — CBC
HCT: 51.5 % (ref 39.0–52.0)
HEMOGLOBIN: 17.6 g/dL — AB (ref 13.0–17.0)
MCH: 32.9 pg (ref 26.0–34.0)
MCHC: 34.2 g/dL (ref 30.0–36.0)
MCV: 96.3 fL (ref 78.0–100.0)
Platelets: 130 10*3/uL — ABNORMAL LOW (ref 150–400)
RBC: 5.35 MIL/uL (ref 4.22–5.81)
RDW: 14.6 % (ref 11.5–15.5)
WBC: 17.5 10*3/uL — AB (ref 4.0–10.5)

## 2017-02-12 LAB — TROPONIN I

## 2017-02-12 LAB — MAGNESIUM: MAGNESIUM: 2.1 mg/dL (ref 1.7–2.4)

## 2017-02-12 LAB — MRSA PCR SCREENING: MRSA BY PCR: POSITIVE — AB

## 2017-02-12 MED ORDER — DIAZEPAM 2 MG PO TABS
2.0000 mg | ORAL_TABLET | Freq: Two times a day (BID) | ORAL | Status: DC | PRN
  Administered 2017-02-12 – 2017-02-26 (×12): 2 mg via ORAL
  Filled 2017-02-12 (×13): qty 1

## 2017-02-12 MED ORDER — MUPIROCIN 2 % EX OINT
1.0000 "application " | TOPICAL_OINTMENT | Freq: Two times a day (BID) | CUTANEOUS | Status: AC
  Administered 2017-02-12 – 2017-02-16 (×10): 1 via NASAL
  Filled 2017-02-12: qty 22

## 2017-02-12 MED ORDER — HEPARIN (PORCINE) IN NACL 100-0.45 UNIT/ML-% IJ SOLN
1650.0000 [IU]/h | INTRAMUSCULAR | Status: DC
  Administered 2017-02-13: 1250 [IU]/h via INTRAVENOUS
  Administered 2017-02-14 – 2017-02-15 (×3): 1650 [IU]/h via INTRAVENOUS
  Filled 2017-02-12 (×8): qty 250

## 2017-02-12 MED ORDER — HEPARIN BOLUS VIA INFUSION
1000.0000 [IU] | INTRAVENOUS | Status: AC
  Administered 2017-02-12: 1000 [IU] via INTRAVENOUS
  Filled 2017-02-12: qty 1000

## 2017-02-12 MED ORDER — QUETIAPINE FUMARATE 50 MG PO TABS
25.0000 mg | ORAL_TABLET | Freq: Every day | ORAL | Status: DC
  Administered 2017-02-12 – 2017-02-25 (×15): 25 mg via ORAL
  Filled 2017-02-12 (×15): qty 1

## 2017-02-12 MED ORDER — CHLORHEXIDINE GLUCONATE CLOTH 2 % EX PADS
6.0000 | MEDICATED_PAD | Freq: Every day | CUTANEOUS | Status: AC
  Administered 2017-02-12 – 2017-02-16 (×4): 6 via TOPICAL

## 2017-02-12 NOTE — Progress Notes (Signed)
Patient complaining of increased shortness of breath, 2L O2 placed on patient, but refusing to wear O2 and taking it off. O2 sats 97-100 on room air. Patient is tripod positioning and has a sense of impending doom. Pt told RN, "I don't think I'm going to make it." Dr. Merlene Pulling notified and to see patient, possible intubation. RN will continue to assess.

## 2017-02-12 NOTE — Progress Notes (Addendum)
ANTICOAGULATION CONSULT NOTE Pharmacy Consult for Heparin Indication: pulmonary embolus  No Known Allergies  Patient Measurements: Height: 5\' 10"  (177.8 cm) Weight: 169 lb 8.5 oz (76.9 kg) IBW/kg (Calculated) : 73 HEPARIN DW (KG): 77.2  Vital Signs: Temp: 98.4 F (36.9 C) (01/23 0800) Temp Source: Axillary (01/23 0800) BP: 99/78 (01/23 0800) Pulse Rate: 108 (01/23 0800)  Labs: Recent Labs    02/10/17 1704 02/10/17 1852  02/10/17 1958  02/11/17 0557 02/11/17 1301 02/11/17 2222 02/12/17 0327  HGB  --   --    < > 18.1*  --  17.9*  --   --  17.6*  17.7*  HCT  --   --   --  54.4*  --  53.0*  --   --  51.5  51.9  PLT  --   --   --  PLATELET CLUMPS NOTED ON SMEAR, COUNT APPEARS ADEQUATE  --  104*  --   --  130*  APTT  --  35  --   --   --   --   --   --   --   LABPROT  --  17.3*  --   --   --   --   --   --   --   INR  --  1.43  --   --   --   --   --   --   --   HEPARINUNFRC  --   --   --   --    < > 0.80* 0.74* 0.59 0.61  CREATININE 1.09  --   --   --   --  1.05  --   --  1.10  TROPONINI <0.03  --   --   --   --   --   --   --  <0.03   < > = values in this interval not displayed.    Estimated Creatinine Clearance: 71.9 mL/min (by C-G formula based on SCr of 1.1 mg/dL).  Assessment: 30 yoM presents with PE/DVT, continues on IV heparin. Heparin level remains therapeutic at upper end of goal this AM, 0.61. Hgb stable 17.7, pltc 130. Still with significant hemoptysis, per patient slightly better than yesterday. SpO2 97% on room air. Will continue on heparin despite bleeding and aim for lower end of goal. Per IR, if hemoptysis worsens, may require IVC filter placement.  Goal of Therapy:  Heparin level 0.3-0.7 units/ml Monitor platelets by anticoagulation protocol: Yes   Plan:  Decrease heparin gtt to 1000 units/hr Daily heparin level and CBC Monitor for s/sx of bleeding, particularly hemoptysis  Erin N. Zigmund Daniel, PharmD PGY1 Pharmacy Resident Pager: [phone] 02/12/17  8:30 AM

## 2017-02-12 NOTE — Progress Notes (Signed)
Temperature low, but patient refusing blankets or increased heat in the room despite education. Pt states he feels warm.

## 2017-02-12 NOTE — Progress Notes (Signed)
Discussed request for IVC filter with Dr. Archer Asa and Dr. Craige Cotta. Per Dr. Craige Cotta hold on IVC filter placement at this time.  Order cancelled.  Please reconsult if needed.  Loyce Dys, MS RD PA-C

## 2017-02-12 NOTE — Progress Notes (Signed)
Per NP, okay for labs to be drawn with morning lab draw since patient is asleep at this time.

## 2017-02-12 NOTE — Progress Notes (Signed)
Patient now says his SOB is improved and he wants to hold off on intubation. Clinically his work of breathing and accessory muscle use are much less. Will continue to monitor closely.

## 2017-02-12 NOTE — Consult Note (Signed)
PULMONARY / CRITICAL CARE MEDICINE   Name: Stephen Fleming MRN: [id number] DOB: 1955-05-02    ADMISSION DATE:  02/10/2017 CONSULTATION DATE:  02/11/2017  REFERRING MD:  Dr. Arbutus Leas  CHIEF COMPLAINT:  Dyspnea on exertion, cough, hemoptysis   HISTORY OF PRESENT ILLNESS:   62 year old male with past medical history significant for systolic heart failure, coronary artery disease, hypertension, and gout who presented to [facility] on 1/21 with worsening exertional shortness of breath lasting 2 days, leg swelling, chest pain, and cough.    At baseline Stephen Fleming has very little physical reserve, but is able to walk ~ 10 feet without significant issue. Normally rides a scooter when shopping.  Denied fever, chills, sick contacts.    Found on ultrasound to have acute bilateral lower extremity DVT.  Started on heparin drip and admitted to [facility] in SDU.  CTA chest demonstrated acute submassive PE with RV/LV ratio of 1, and TTE with evidence of right heart strain. He developed mild hemoptysis on the afternoon of 1/22, systolic BP 80-90's, however he was given his normal blood pressure medicines and lasix earlier.  There was concern that Stephen Fleming may need IR intervention and therefore transferred to [facility] on PCCM service.  Stephen Fleming has remained stable on room air and on review of prior visits, baseline systolic BP runs in the 90's.    PAST MEDICAL HISTORY :  He  has a past medical history of Arthritis, Asthma, CAD (coronary artery disease), CHF (congestive heart failure), NYHA class II, chronic, combined (HCC), Eczema, Gout, Heart murmur, Hypertension, Mitral regurgitation, OSA (obstructive sleep apnea), and Pulmonary hypertension (HCC).  PAST SURGICAL HISTORY: He  has a past surgical history that includes Back surgery; Appendectomy (~  1967); Inguinal hernia repair (Right, ~ 1976); Lumbar disc surgery (1991; 1993); Colonoscopy (~ 2008); Esophagogastroduodenoscopy (~ 2008); and RIGHT/LEFT HEART CATH AND CORONARY  ANGIOGRAPHY (N/A, 12/04/2016).  No Known Allergies  No current facility-administered medications on file prior to encounter.    Current Outpatient Medications on File Prior to Encounter  Medication Sig  . albuterol (PROVENTIL HFA;VENTOLIN HFA) 108 (90 BASE) MCG/ACT inhaler Inhale 2 puffs into the lungs every 6 (six) hours as needed for wheezing.  Marland Kitchen aspirin 81 MG tablet Take 81 mg by mouth daily.  . benzonatate (TESSALON) 200 MG capsule Take 1 capsule (200 mg total) by mouth 3 (three) times daily as needed for cough.  . Fluticasone-Salmeterol (ADVAIR) 250-50 MCG/DOSE AEPB Inhale 1 puff into the lungs every 12 (twelve) hours.  . furosemide (LASIX) 20 MG tablet Take 1 tablet (20 mg total) by mouth daily.  Marland Kitchen losartan (COZAAR) 50 MG tablet Take 50 mg by mouth daily.  . metoprolol succinate (TOPROL XL) 25 MG 24 hr tablet Take 0.5 tablets (12.5 mg total) by mouth daily.  . polyethylene glycol powder (GLYCOLAX/MIRALAX) powder Take 17 g daily by mouth.  . potassium chloride SA (K-DUR,KLOR-CON) 20 MEQ tablet Take 1 tablet (20 mEq total) daily by mouth.   FAMILY HISTORY:  His indicated that his mother is deceased. He indicated that his father is deceased. He indicated that his brother is deceased.  SOCIAL HISTORY: He  reports that  has never smoked. he has never used smokeless tobacco. He reports that he drinks about 12.0 oz of alcohol per week. He reports that he does not use drugs.  REVIEW OF SYSTEMS:  POSITIVES IN BOLD Gen: Denies fever, chills, weight change, fatigue,  PULM: Denies shortness of breath with exertion, cough, sputum production, hemoptysis, wheezing CV: Denies  chest pain with inspiration, BLE edema, orthopnea,  GI: Denies abdominal pain, nausea, vomiting, diarrhea, hematochezia, melena, constipation, change in bowel habits - dark when constipated  GU: Denies dysuria, hematuria Heme: Denies easy bruising, bleeding Neuro: Denies headache, numbness, loss of memory or  consciousness  SUBJECTIVE:  Stephen Fleming comfortable but hungry. He would like to eat. Denied   VITAL SIGNS: BP (!) 84/64   Pulse (!) 103   Temp (!) 96.5 F (35.8 C) (Axillary)   Resp 12   Ht 5\' 10"  (1.778 m)   Wt 169 lb 8.5 oz (76.9 kg)   SpO2 94%   BMI 24.33 kg/m   INTAKE / OUTPUT: I/O last 3 completed shifts: In: 897.2 [P.O.:780; I.V.:117.2] Out: 625 [Urine:625]  PHYSICAL EXAMINATION: General: Adult male - appears fatigued, sitting up in bed in NAD HEENT: [place]/AT, Stephen Fleming acutely coughing up small bloods clots, pupils equal round/ reactive, PERRL Neuro: fatigued, alert, oriented x 3, MAE CV: RRR, S4 noted PULM: breaths sounds even, lungs clear on right, diminished in left base, > 98% on RA GI: soft, non-tender, bs active  Extremities: cool/ dry, RLE swelling > LLE, +2 ankle edema  Skin: no rashes  LABS:   BMET Recent Labs  Lab 02/10/17 1704 02/11/17 0557 02/12/17 0327  NA 135 134* 134*  K 5.2* 5.3* 5.0  CL 100* 98* 100*  CO2 21* 21* 22  BUN 35* 34* 31*  CREATININE 1.09 1.05 1.10  GLUCOSE 94 94 123*   Electrolytes Recent Labs  Lab 02/10/17 1704 02/11/17 0557 02/12/17 0327  CALCIUM 9.6 9.2 9.1  MG  --   --  2.1  PHOS  --   --  2.9   CBC Recent Labs  Lab 02/10/17 1958 02/11/17 0557 02/12/17 0327  WBC 10.7* 12.4* 17.5*  HGB 18.1* 17.9* 17.6*  17.7*  HCT 54.4* 53.0* 51.5  51.9  PLT PLATELET CLUMPS NOTED ON SMEAR, COUNT APPEARS ADEQUATE 104* 130*   Coag's Recent Labs  Lab 02/10/17 1852  APTT 35  INR 1.43   Sepsis Markers No results for input(s): LATICACIDVEN, PROCALCITON, O2SATVEN in the last 168 hours.  ABG No results for input(s): PHART, PCO2ART, PO2ART in the last 168 hours.  Liver Enzymes Recent Labs  Lab 02/10/17 1704 02/12/17 0327  AST 59* 40  ALT 45 35  ALKPHOS 70 63  BILITOT 3.3* 2.8*  ALBUMIN 3.8 3.3*  3.3*   Cardiac Enzymes Recent Labs  Lab 02/10/17 1704 02/12/17 0327  TROPONINI <0.03 <0.03   Glucose Recent Labs   Lab 02/11/17 2032  GLUCAP 119*   Imaging Ct Angio Chest Pe W Or Wo Contrast  Addendum Date: 02/11/2017   ADDENDUM REPORT: 02/11/2017 15:54 ADDENDUM: I discussed the findings of this study with Dr. Arbutus Leas at 3:52 p.m. Electronically Signed   By: Dwyane Dee M.D.   On: 02/11/2017 15:54   Result Date: 02/11/2017 CLINICAL DATA:  Short of breath, coughing up blood, evaluate for pulmonary embolism EXAM: CT ANGIOGRAPHY CHEST WITH CONTRAST TECHNIQUE: Multidetector CT imaging of the chest was performed using the standard protocol during bolus administration of intravenous contrast. Multiplanar CT image reconstructions and MIPs were obtained to evaluate the vascular anatomy. CONTRAST:  ISOVUE-370 IOPAMIDOL (ISOVUE-370) INJECTION 76% COMPARISON:  Chest x-ray of 02/10/2017 FINDINGS: Cardiovascular: The pulmonary arteries are well opacified. There are extensive bilateral pulmonary emboli. There is a large embolus almost completely occluding the distal left main pulmonary artery extending into the branches to the upper lobe, lower lobe, and lingula. On the  right a large embolus also is partially occlusive within the distal portion of the main right pulmonary artery. This thrombus also extends into the right upper lobe, right middle lobe, and right lower lobe. The RV/LV ratio is approximately 1 which implies the Stephen Fleming is at risk for increased morbidity and mortality. The LV is difficult to evaluate due to poor opacification with contrast media. There is moderate cardiomegaly present. No pericardial effusion is seen. Coronary artery calcifications are noted diffusely. Mediastinum/Nodes: No mediastinal or hilar adenopathy is seen. The thyroid gland is unremarkable. No hiatal hernia is seen. Lungs/Pleura: On lung window images there is parenchymal infiltrate primarily peripherally within much of the left lower lobe and to a lesser degree within the posterosuperior aspect of the left upper lobe. Although these could  represent foci of pneumonia, developing pulmonary infarction would also be a consideration. The right lung is clear. No pleural effusion is seen. Upper Abdomen: The portion of the upper abdomen that is visualized is unremarkable. There may be some atrophy of the left kidney with apparent compensatory hypertrophy of the right kidney. Also there may be a cyst emanating from the mid right kidney peripherally which is difficult to assess on this unenhanced study. Musculoskeletal: The thoracic vertebrae are in normal alignment. No compression deformity is seen. There are degenerative changes in the mid to lower thoracic spine. Review of the MIP images confirms the above findings. IMPRESSION: 1. Extensive partially occlusive bilateral pulmonary emboli with RV/LV ratio of 1.0 implying increased morbidity mortality. Positive for acute PE with CT evidence of right heart strain (RV/LV Ratio = 1.0) consistent with at least submassive (intermediate risk) PE. The presence of right heart strain has been associated with an increased risk of morbidity and mortality. Please activate Code PE by paging [phone]. 2. Parenchymal opacity within much of the left lower lobe and a small portion of the posterosuperior left upper lobe could represent pneumonia or developing infarction. No pleural effusion. 3. Apparent partial atrophy of the left kidney with some compensatory hypertrophy of the right kidney. Low-attenuation structure emanates from the periphery the right mid kidney difficult to assess on this study. Consider ultrasound to assess further. Electronically Signed: By: Dwyane Dee M.D. On: 02/11/2017 15:47   STUDIES:  1/21 Bilateral Venous US >>  1. Occlusive DVT in LEFT posterior tibial and popliteal veins. 2. Occlusive DVT in RIGHT posterior tibial, popliteal, and femoral Veins.  1/22 CTA chest >>  1.  Extensive partially occlusive bilateral pulmonary emboli with RV/LV ratio of 1.0 implying increased morbidity  mortality. Positive for acute PE with CT evidence of right heart strain (RV/LV Ratio = 1.0) consistent with at least submassive (intermediate risk) PE.  2.  Parenchymal opacity within much of the left lower lobe and a small portion of the posterosuperior left upper lobe could represent pneumonia or developing infarction. No pleural effusion. 3.  Apparent partial atrophy of the left kidney with some compensatory hypertrophy of the right kidney. Low-attenuation structure emanates from the periphery the right mid kidney difficult to assess on this study.  1/22 TTE >>  LVEF 10-15%; moderately dilated LV, diffuse hypokinesis, akinesis of basal mid anterior septal myocardium, mild AR, mild MV calcified annulus and mild MR; LA severely dilated, RV septum flattend in systole and diastole c/w RV pressure and volume overload with moderately reduced systolic function, a semimobile linear structure in the right, atrium that likely represents a thrombus,  atrium was moderately dilated, no PFO or defect; mod TR, PAP 46 mm  Hg; IVC dilated with blunted respirophasic changes  CULTURES: 1/22 MRSA PCR >>  ANTIBIOTICS: none  SIGNIFICANT EVENTS: 1/21 Admit [facility] 1/22 Tx to [facility] MICU  LINES/TUBES: PIVs x 2   DISCUSSION: 2 yoM with PMH of systolic HF, HLD, and recent reduction in activity tolerance since October 2018 presenting with chest pain, worsening dyspnea on exertion, and BLE swelling since 1/17 found to have acute BLE DVTs and submassive PE with acute right heart strain.  Stephen Fleming has been mostly at his baseline with hemodynamics and stable on room air.    Likely to be transferred to floor tomorrow if he remains stable.   ASSESSMENT / PLAN:  PULMONARY A: Submassive PE with acute right heart strain - likely from acute reduction in activity since 10/2016. LLL atelectasis- likely pulmonary infarct  Hemoptysis  IR believes RV pressure elevation to be at baseline despite bilateral PE. Given that fact  and the patients hemoptysis they do not recommend ECHOS. Recommend placing IVC filter if hemoptysis worsens  VBG on 01/22 7.454/32.1/44/22.8 P:   ICU monitoring given high risk for intubation  Continue heparin gtt for now; hold if hemoptysis progresses O2 prn for sats > 94% or for dyspnea  ABG prn   CARDIOVASCULAR A:  Systolic HF - LVEF from 20-25% (11/2016)/ PAP 62 mm Hg  - 1/22 TTE LVEF 10-15%/ PAP 47 mm Hg Acute PE with Right Heart strain and acute BLE DVT Hx HTN, CAD, mod MR - previous LHC 12/04/2016 thought not severe enough to explain LV dysfunction, possibly due to chronic ETOH use? Troponin trend unremarkable P:  Tele monitoring Goal MAP > 65 Hold lasix and HTN as Stephen Fleming is pre-load dependent  Appreciate Cards following Trend troponin  Goal K > 4/ Mag >2 Continue heparin gtt  RENAL A:   Mild hyperkalemia 5.0 Magnesium 2.1 P:   Follow Magnesium Trend BMP / mag/ urinary output/ daily weights Replace electrolytes as indicated Avoid nephrotoxic agents, ensure adequate renal perfusion  GASTROINTESTINAL A:   Mild Transaminitis Hx ETOH- denies use since Oct 2018  P:   NPO in ancipitation for IR procedure Does not meet SUP criteria  Trend LFTs  HEMATOLOGIC A:   Polycythemia Thrombocytopenia Acute DVT BLE P:  Monitor for bleeding/ increased hemoptysis  Continue systemic heparin gtt for now; hold if hemoptysis significantly increases Trend CBC NO SCDs  INFECTIOUS A:   Mild leukocytosis- likely reactive - LLL atelectasis likely pulmonary infarct P:   Trend WBC/ fever curve Monitor clinically  ENDOCRINE A:   No acute issues  P:   Trend glucose on BMET  NEUROLOGIC A:   Hx of ETOH Stephen Fleming reported he stopped drinking 10/2016 P:   Monitor for EtOH withdraw  FAMILY  - Updates: Stephen Fleming and family member updated at bedside by Dr. Merlene Pulling.  Stephen Fleming states his wife can make decisions for him, but as for admission, he was agreeable to aggressive  measures including intubation, CPR, EKOS, systemic thrombolytics if needed.    - Inter-disciplinary family meet or Palliative Care meeting due by:  02/18/2017   Lanelle Bal, MD PCCM resident  Pager #: [phone]

## 2017-02-12 NOTE — Progress Notes (Addendum)
Patient continues to remove venti mask, refusing to wear O2 at this time. O2 sats stable 90-98% on room air.

## 2017-02-12 NOTE — Progress Notes (Signed)
ANTICOAGULATION CONSULT NOTE Pharmacy Consult for Heparin Indication: pulmonary embolus/DVT  No Known Allergies  Patient Measurements: Height: 5\' 10"  (177.8 cm) Weight: 169 lb 8.5 oz (76.9 kg) IBW/kg (Calculated) : 73 Heparin dosing weight: 76.9 kg  Vital Signs: Temp: 97.6 F (36.4 C) (01/23 1157) Temp Source: Axillary (01/23 1157) BP: 94/75 (01/23 1900) Pulse Rate: 113 (01/23 1900)  Labs: Recent Labs    02/10/17 1704 02/10/17 1852  02/10/17 1958 02/11/17 0557  02/11/17 2222 02/12/17 0327 02/12/17 1838  HGB  --   --    < > 18.1* 17.9*  --   --  17.6*  17.7*  --   HCT  --   --   --  54.4* 53.0*  --   --  51.5  51.9  --   PLT  --   --   --  PLATELET CLUMPS NOTED ON SMEAR, COUNT APPEARS ADEQUATE 104*  --   --  130*  --   APTT  --  35  --   --   --   --   --   --   --   LABPROT  --  17.3*  --   --   --   --   --   --   --   INR  --  1.43  --   --   --   --   --   --   --   HEPARINUNFRC  --   --   --   --  0.80*   < > 0.59 0.61 0.23*  CREATININE 1.09  --   --   --  1.05  --   --  1.10  --   TROPONINI <0.03  --   --   --   --   --   --  <0.03  --    < > = values in this interval not displayed.    Estimated Creatinine Clearance: 71.9 mL/min (by C-G formula based on SCr of 1.1 mg/dL).  Assessment: 19 yoM presents with PE/DVT, continues on IV heparin. Hgb stable 17.7, pltc 130.  Heparin level tonight is 0.23, SUBtherapeutic after heparin rate decreased today to 1000 units/hr.  Hemoptysis,  patient says this has improved per RN's report.  No other bleeding noted Targeting lower end of  therpapeutic goal due to hemoptysis.  Dr. Craige Cotta holding on IVC filter placement at this time.   Goal of Therapy:  Heparin level 0.3-0.7 units/ml Monitor platelets by anticoagulation protocol: Yes   Plan:  Give heparin 1000 units IV x1 bolus to target therapeutic goal for PE/DVT Increase heparin gtt to 1050 units/hr Check 6 hour heparin level Daily heparin level and CBC Monitor for s/sx  of bleeding, particularly hemoptysis  Noah Delaine, RPh Clinical Pharmacist Pager: [phone] 4P-10P [phone] Main Pharmacy [phone] 02/12/17 8:04 PM

## 2017-02-12 NOTE — Progress Notes (Signed)
VBG showed no hypercapnea. Patient is not hypoxic and VSS. However, he is picking at his IV tubing and talking about closets. His family (who has been keeping him awake all night) are concerned about this. I am concerned he is becoming delirious from lack of sleep. Have asked family to step out into waiting room or go home to allow patient to sleep. Will start seroquel 25mg  qHS with first dose now.

## 2017-02-12 NOTE — Progress Notes (Signed)
   02/12/17 1100  Clinical Encounter Type  Visited With Patient  Visit Type Initial  Referral From Nurse  Consult/Referral To Chaplain  Recommendations   Spiritual Encounters  Spiritual Needs Emotional  Stress Factors  Patient Stress Factors Exhausted  Family Stress Factors Exhausted    Patient was in his bed with two doctors on-site. No family member on-site. Patient seemingly very weak and asked to be revisited later. Patient asked for water and his nurse brought to him. I provided emotional support throh my compassionate presence and encouragement.  Mendell Bontempo a Water quality scientist, [employer]

## 2017-02-12 NOTE — Progress Notes (Addendum)
Patient became confused and agitated, called his wife and according to family "not acting himself." Dr. Merlene Pulling notified and to place orders.

## 2017-02-13 DIAGNOSIS — I824Y3 Acute embolism and thrombosis of unspecified deep veins of proximal lower extremity, bilateral: Secondary | ICD-10-CM

## 2017-02-13 DIAGNOSIS — R0600 Dyspnea, unspecified: Secondary | ICD-10-CM

## 2017-02-13 DIAGNOSIS — I5042 Chronic combined systolic (congestive) and diastolic (congestive) heart failure: Secondary | ICD-10-CM

## 2017-02-13 DIAGNOSIS — R0902 Hypoxemia: Secondary | ICD-10-CM

## 2017-02-13 LAB — PROTEIN S ACTIVITY: PROTEIN S ACTIVITY: 68 % (ref 63–140)

## 2017-02-13 LAB — COOXEMETRY PANEL
CARBOXYHEMOGLOBIN: 1.8 % — AB (ref 0.5–1.5)
Carboxyhemoglobin: 1.5 % (ref 0.5–1.5)
METHEMOGLOBIN: 0.9 % (ref 0.0–1.5)
Methemoglobin: 0.6 % (ref 0.0–1.5)
O2 SAT: 55.2 %
O2 Saturation: 65.4 %
TOTAL HEMOGLOBIN: 15.4 g/dL (ref 12.0–16.0)
Total hemoglobin: 14 g/dL (ref 12.0–16.0)

## 2017-02-13 LAB — BETA-2-GLYCOPROTEIN I ABS, IGG/M/A
Beta-2-Glycoprotein I IgA: <9 GPI IgA units (ref 0–25)
Beta-2-Glycoprotein I IgM: <9 GPI IgM units (ref 0–32)

## 2017-02-13 LAB — HEPARIN LEVEL (UNFRACTIONATED)
Heparin Unfractionated: 0.29 IU/mL — ABNORMAL LOW (ref 0.30–0.70)
Heparin Unfractionated: 0.2 IU/mL — ABNORMAL LOW (ref 0.30–0.70)
Heparin Unfractionated: 0.2 IU/mL — ABNORMAL LOW (ref 0.30–0.70)

## 2017-02-13 LAB — PROTEIN C, TOTAL: PROTEIN C, TOTAL: 45 % — AB (ref 60–150)

## 2017-02-13 LAB — CBC
HEMATOCRIT: 43.4 % (ref 39.0–52.0)
Hemoglobin: 14.8 g/dL (ref 13.0–17.0)
MCH: 32.5 pg (ref 26.0–34.0)
MCHC: 34.1 g/dL (ref 30.0–36.0)
MCV: 95.4 fL (ref 78.0–100.0)
Platelets: 93 10*3/uL — ABNORMAL LOW (ref 150–400)
RBC: 4.55 MIL/uL (ref 4.22–5.81)
RDW: 14.6 % (ref 11.5–15.5)
WBC: 11.5 10*3/uL — AB (ref 4.0–10.5)

## 2017-02-13 LAB — BASIC METABOLIC PANEL
ANION GAP: 10 (ref 5–15)
BUN: 28 mg/dL — ABNORMAL HIGH (ref 6–20)
CO2: 21 mmol/L — ABNORMAL LOW (ref 22–32)
Calcium: 8.6 mg/dL — ABNORMAL LOW (ref 8.9–10.3)
Chloride: 103 mmol/L (ref 101–111)
Creatinine, Ser: 0.71 mg/dL (ref 0.61–1.24)
GLUCOSE: 95 mg/dL (ref 65–99)
POTASSIUM: 4.4 mmol/L (ref 3.5–5.1)
Sodium: 134 mmol/L — ABNORMAL LOW (ref 135–145)

## 2017-02-13 LAB — PROTEIN S, TOTAL: PROTEIN S AG TOTAL: 77 % (ref 60–150)

## 2017-02-13 LAB — PROTEIN C ACTIVITY: PROTEIN C ACTIVITY: 42 % — AB (ref 73–180)

## 2017-02-13 LAB — CARDIOLIPIN ANTIBODIES, IGG, IGM, IGA
Anticardiolipin IgA: <9 APL U/mL (ref 0–11)
Anticardiolipin IgM: <9 MPL U/mL (ref 0–12)

## 2017-02-13 MED ORDER — SODIUM CHLORIDE 0.9% FLUSH
10.0000 mL | Freq: Two times a day (BID) | INTRAVENOUS | Status: DC
  Administered 2017-02-13 – 2017-02-14 (×2): 10 mL
  Administered 2017-02-15: 20 mL
  Administered 2017-02-15 – 2017-02-25 (×11): 10 mL

## 2017-02-13 MED ORDER — ATORVASTATIN CALCIUM 40 MG PO TABS
40.0000 mg | ORAL_TABLET | Freq: Every day | ORAL | Status: DC
  Administered 2017-02-13 – 2017-02-26 (×14): 40 mg via ORAL
  Filled 2017-02-13 (×14): qty 1

## 2017-02-13 MED ORDER — MILRINONE LACTATE IN DEXTROSE 20-5 MG/100ML-% IV SOLN
0.1250 ug/kg/min | INTRAVENOUS | Status: DC
  Administered 2017-02-13 – 2017-02-18 (×8): 0.25 ug/kg/min via INTRAVENOUS
  Filled 2017-02-13 (×8): qty 100

## 2017-02-13 MED ORDER — DIGOXIN 125 MCG PO TABS
0.1250 mg | ORAL_TABLET | Freq: Every day | ORAL | Status: DC
  Administered 2017-02-13 – 2017-02-26 (×14): 0.125 mg via ORAL
  Filled 2017-02-13 (×14): qty 1

## 2017-02-13 MED ORDER — SODIUM CHLORIDE 0.9% FLUSH
10.0000 mL | INTRAVENOUS | Status: DC | PRN
  Administered 2017-02-22 – 2017-02-23 (×2): 10 mL
  Filled 2017-02-13 (×2): qty 40

## 2017-02-13 NOTE — Progress Notes (Signed)
Peripherally Inserted Central Catheter/Midline Placement  The IV Nurse has discussed with the patient and/or persons authorized to consent for the patient, the purpose of this procedure and the potential benefits and risks involved with this procedure.  The benefits include less needle sticks, lab draws from the catheter, and the patient may be discharged home with the catheter. Risks include, but not limited to, infection, bleeding, blood clot (thrombus formation), and puncture of an artery; nerve damage and irregular heartbeat and possibility to perform a PICC exchange if needed/ordered by physician.  Alternatives to this procedure were also discussed.  Bard Power PICC patient education guide, fact sheet on infection prevention and patient information card has been provided to patient /or left at bedside.    PICC/Midline Placement Documentation        Stephen Fleming 02/13/2017, 12:54 PM

## 2017-02-13 NOTE — Progress Notes (Signed)
ANTICOAGULATION CONSULT NOTE Pharmacy Consult for Heparin Indication: pulmonary embolus  No Known Allergies  Patient Measurements: Height: 5\' 10"  (177.8 cm) Weight: 170 lb 6.7 oz (77.3 kg) IBW/kg (Calculated) : 73 HEPARIN DW (KG): 77.2  Vital Signs: Temp: 97.8 F (36.6 C) (01/24 2039) Temp Source: Oral (01/24 2039) BP: 98/74 (01/24 2000) Pulse Rate: 116 (01/24 1900)  Labs: Recent Labs    02/11/17 0557  02/12/17 0327  02/13/17 0215 02/13/17 0943 02/13/17 2007  HGB 17.9*  --  17.6*  17.7*  --  14.8  --   --   HCT 53.0*  --  51.5  51.9  --  43.4  --   --   PLT 104*  --  130*  --  93*  --   --   HEPARINUNFRC 0.80*   < > 0.61   < > 0.29* 0.20* 0.20*  CREATININE 1.05  --  1.10  --  0.71  --   --   TROPONINI  --   --  <0.03  --   --   --   --    < > = values in this interval not displayed.    Estimated Creatinine Clearance: 98.9 mL/min (by C-G formula based on SCr of 0.71 mg/dL).  Assessment: 15 yoM presents with PE/DVT, continuing on IV heparin. Heparin level remains subtherapeutic at 0.20 despite three rate increases throughout the day. Hemoptysis has stablized - no worse per RN. Hgb down 14.8, pltc down 93 this AM. No issues with infusion confirmed with nursing. PICC line in place.   Goal of Therapy:  Heparin level 0.3-0.7 units/ml Monitor platelets by anticoagulation protocol: Yes   Plan:  Will not bolus 2/2 bleeding Increase heparin gtt to 1650 units/hr Recheck heparin level in 8 hours and CBC Daily heparin level and CBC Monitor for s/sx of bleeding, particularly hemoptysis  Link Snuffer, PharmD, BCPS, BCCCP Clinical Pharmacist Clinical phone 02/13/2017 until 11PM [phone] After hours, please call #[phone] 02/13/17 9:03 PM

## 2017-02-13 NOTE — Consult Note (Signed)
Advanced Heart Failure Team Consult Note   Primary Physician: Dr Margo Common  Primary Cardiologist: Dr Eden Emms  Reason for Consultation: Acute/Chronic Systolic heart Failure   HPI:    Stephen Fleming is seen today for evaluation of heart failure at the request of Dr Rennis Golden.   Stephen Fleming is a 62 year old with history of  Combined systolic/diastoic heart failure, CAD (cath in 11/2016 showing60% first diagonal, 95% ostial small OM1, 70% OM2, and 50% mid PDA --> medical management recommended, moderate Stephen, HTN, OSA intolerant   CPAP.   Admitted from [facility] 11/2016 with increased leg edema and dyspnea. ECHOcompleted and showed EF 20%. Had RHC/LHC has noted below. Plan for medical treatment for CAD. Diuresed with IV lasix and transitioned to po lasix. Placed on losartan and coreg.   He was seen by Cardiolgy on 01/22/17 with complaint fatigue and increased dyspnea. He had a couple syncopal episodes. 30 day event monitor was ordered. Coreg was stopped due to poor appetite and he was switched to Toprol XL.  Last week he developed bilateral leg pain. Increased dyspnea and fatigue. Unable to walk much. Stopped drinking alcohol in November. Has not worked since November.   Presented to [facility] with bilateral leg pain and increased dyspnea. CXR LLL concerning for pneumonia. [place] completed and showed DVT L and RLE. CTA with PE partially occlusive bilateral pulmonary emboli and RV strain. He was placed on heparin. Pertinent labs include: troponin 0.03, WBC 17.5, Hgb 17.6, K 5, and creatinine 1.1.   Has had hemoptysis. SOB at rest.   Echo 02/11/2017 Left ventricle: The cavity size was moderately dilated. Wall   thickness was normal. The estimated ejection fraction was in the   range of 10% to 15%. Diffuse hypokinesis. The study is not   technically sufficient to allow evaluation of LV diastolic   function. - Regional wall motion abnormality: Akinesis of the basal-mid   anteroseptal myocardium. - Aortic valve:  There was mild regurgitation. Valve area (VTI):   3.88 cm^2. Valve area (Vmax): 3.22 cm^2. Valve area (Vmean): 3.23   cm^2. - Mitral valve: Mildly calcified annulus. There was mild   regurgitation. - Left atrium: The atrium was severely dilated. - Right ventricle: The ventricular septum is flattend in systole   and diastole consistent with RV pressure and volume overload.   Systolic function was moderately reduced. TAPSE: 12.6 mm . - Right atrium: There is a semimobile linear structure in the right   atrium that likely represents a thrombus. The atrium was   moderately dilated. - Atrial septum: No defect or patent foramen ovale was identified. - Tricuspid valve: There was moderate regurgitation. - Pulmonary arteries: Systolic pressure was moderately increased.   PA peak pressure: 46 mm Hg (S). - Inferior vena cava: The vessel was dilated. The respirophasic   diameter changes were blunted (< 50%), consistent with elevated   central venous pressure.  ECHO 11/2016 EF 20-25%  RV mild/mod dilated. RV normal.     LHC/RHC 12/04/2016  Prox RCA to Mid RCA lesion is 30% stenosed.  RPDA lesion is 50% stenosed.  Mid LAD lesion is 25% stenosed.  Prox LAD lesion is 20% stenosed.  Ost 1st Diag lesion is 60% stenosed.  Ost 1st Mrg lesion is 95% stenosed.  Ost 2nd Mrg lesion is 70% stenosed.  Hemodynamic findings consistent with moderate pulmonary hypertension.  1. CAD with 60% first diagonal, 95% ostial small OM1, 70% OM2, and 50% mid PDA 2. Moderate pulmonary HTN with mean  PAP 37 mm Hg 3. Moderately elevated LV filling pressures with PCWP of 29 mm Hg 4. Reduced CO with index 1.65.   Review of Systems: [y] = yes, [ ]  = no   General: Weight gain [ ] ; Weight loss [Y ]; Anorexia [ ] ; Fatigue [Y ]; Fever [ ] ; Chills [ ] ; Weakness [Y ]  Cardiac: Chest pain/pressure [ ] ; Resting SOB [Y ]; Exertional SOB [Y ]; Orthopnea [ ] ; Pedal Edema [ ] ; Palpitations [ ] ; Syncope [ ] ; Presyncope [ ] ;  Paroxysmal nocturnal dyspnea[ ]   Pulmonary: Cough [ ] ; Wheezing[ ] ; Hemoptysis[ ] ; Sputum [ ] ; Snoring [ ]   GI: Vomiting[ ] ; Dysphagia[ ] ; Melena[ ] ; Hematochezia [ ] ; Heartburn[ ] ; Abdominal pain [ ] ; Constipation [ ] ; Diarrhea [ ] ; BRBPR [ ]   GU: Hematuria[ ] ; Dysuria [ ] ; Nocturia[ ]   Vascular: Pain in legs with walking [ ] ; Pain in feet with lying flat [ ] ; Non-healing sores [ ] ; Stroke [ ] ; TIA [ ] ; Slurred speech [ ] ;  Neuro: Headaches[ ] ; Vertigo[ ] ; Seizures[ ] ; Paresthesias[ ] ;Blurred vision [ ] ; Diplopia [ ] ; Vision changes [ ]   Ortho/Skin: Arthritis [ ] ; Joint pain [Y ]; Muscle pain [ ] ; Joint swelling [ ] ; Back Pain [ Y]; Rash [ ]   Psych: Depression[ ] ; Anxiety[ ]   Heme: Bleeding problems [ ] ; Clotting disorders [ ] ; Anemia [ ]   Endocrine: Diabetes [ ] ; Thyroid dysfunction[ ]   Home Medications Prior to Admission medications   Medication Sig Start Date End Date Taking? Authorizing Provider  albuterol (PROVENTIL HFA;VENTOLIN HFA) 108 (90 BASE) MCG/ACT inhaler Inhale 2 puffs into the lungs every 6 (six) hours as needed for wheezing.   Yes [provider]  aspirin 81 MG tablet Take 81 mg by mouth daily.   Yes [provider]  benzonatate (TESSALON) 200 MG capsule Take 1 capsule (200 mg total) by mouth 3 (three) times daily as needed for cough. 01/22/17  Yes Strader, [place] M, PA-C  Fluticasone-Salmeterol (ADVAIR) 250-50 MCG/DOSE AEPB Inhale 1 puff into the lungs every 12 (twelve) hours.   Yes [provider]  furosemide (LASIX) 20 MG tablet Take 1 tablet (20 mg total) by mouth daily. 01/01/17 04/01/17 Yes Dyann Kief, PA-C  losartan (COZAAR) 50 MG tablet Take 50 mg by mouth daily.   Yes [provider]  metoprolol succinate (TOPROL XL) 25 MG 24 hr tablet Take 0.5 tablets (12.5 mg total) by mouth daily. 01/22/17  Yes Strader, [place] M, PA-C  polyethylene glycol powder (GLYCOLAX/MIRALAX) powder Take 17 g daily by mouth. 08/31/16  Yes [provider]  potassium chloride SA (K-DUR,KLOR-CON) 20 MEQ tablet Take 1 tablet (20 mEq total) daily by mouth. 12/07/16  Yes Berton Bon, NP    Past Medical History: Past Medical History:  Diagnosis Date  . Arthritis    "probably in all my joints" (12/03/2016)  . Asthma   . CAD (coronary artery disease)    a. LHC 12/04/16: 30% RCA, 50% RPDA, 25% mid LAD, 95% 1st diag, 70% 2nd diag.  . CHF (congestive heart failure), NYHA class II, chronic, combined (HCC)    12/04/16: Echo EF 20-25%, Gr 3 DD, PA peak pressure 62 mmgHg, RHC: PCPW 29 mmHg  . Eczema   . Gout    "I take RX for it" (12/03/2016)  . Heart murmur   . Hypertension   . Mitral regurgitation    12/04/16: moderate by echo  . OSA (obstructive sleep apnea)    "  never could tolerate the mask" (12/03/2016)  . Pulmonary hypertension (HCC)    12/04/16: RHC: Mod PHTN, PAP 37 mmHg    Past Surgical History: Past Surgical History:  Procedure Laterality Date  . APPENDECTOMY  ~  1967  . BACK SURGERY    . COLONOSCOPY  ~ 2008  . ESOPHAGOGASTRODUODENOSCOPY  ~ 2008  . INGUINAL HERNIA REPAIR Right ~ 1976  . LUMBAR DISC SURGERY  1991; 1993   "[name] did them both"  . RIGHT/LEFT HEART CATH AND CORONARY ANGIOGRAPHY N/A 12/04/2016   Procedure: RIGHT/LEFT HEART CATH AND CORONARY ANGIOGRAPHY;  Surgeon: [place], Peter M, MD;  Location: [facility] INVASIVE CV LAB;  Service: Cardiovascular;  Laterality: N/A;    Family History: Family History  Problem Relation Age of Onset  . Heart attack Mother   . CAD Father   . Heart attack Brother     Social History: Social History   Socioeconomic History  . Marital status: Married    Spouse name: None  . Number of children: None  . Years of education: None  . Highest education level: None  Social Needs  . Financial resource strain: None  . Food insecurity - worry: None  . Food insecurity - inability: None  . Transportation needs - medical: None  . Transportation needs - non-medical:  None  Occupational History  . None  Tobacco Use  . Smoking status: Never Smoker  . Smokeless tobacco: Never Used  Substance and Sexual Activity  . Alcohol use: Yes    Alcohol/week: 12.0 oz    Types: 20 Cans of beer per week  . Drug use: No  . Sexual activity: Not Currently  Other Topics Concern  . None  Social History Narrative  . None    Allergies:  No Known Allergies  Objective:    Vital Signs:   Temp:  [97.4 F (36.3 C)-98.6 F (37 C)] 97.7 F (36.5 C) (01/24 0723) Pulse Rate:  [103-131] 114 (01/24 0700) Resp:  [17-31] 25 (01/24 0700) BP: (81-101)/(60-88) 97/78 (01/24 0700) SpO2:  [91 %-100 %] 100 % (01/24 0700) Weight:  [170 lb 6.7 oz (77.3 kg)] 170 lb 6.7 oz (77.3 kg) (01/24 0426) Last BM Date: 02/10/17  Weight change: Filed Weights   02/11/17 2000 02/12/17 0441 02/13/17 0426  Weight: 170 lb 3.1 oz (77.2 kg) 169 lb 8.5 oz (76.9 kg) 170 lb 6.7 oz (77.3 kg)    Intake/Output:   Intake/Output Summary (Last 24 hours) at 02/13/2017 0907 Last data filed at 02/13/2017 0600 Gross per 24 hour  Intake 328.68 ml  Output 1350 ml  Net -1021.32 ml      Physical Exam    General:  SOB at rest HEENT: normal Neck: supple. JVP 6-7 . Carotids 2+ bilat; no bruits. No lymphadenopathy or thyromegaly appreciated. Cor: PMI nondisplaced. Tachy Regular rate & rhythm. No rubs, gallops or murmurs. Lungs: clear on room air.  Abdomen: soft, nontender, nondistended. No hepatosplenomegaly. No bruits or masses. Good bowel sounds. Extremities: no cyanosis, clubbing, rash, R and LLE trace edema Neuro: alert & orientedx3, cranial nerves grossly intact. moves all 4 extremities w/o difficulty. Affect pleasant   Telemetry   Sinus Tach 110-120s   EKG   02/10/2017 NSR 98 bpm  LBBB QRS 166 ms    Labs   Basic Metabolic Panel: Recent Labs  Lab 02/10/17 1704 02/11/17 0557 02/12/17 0327 02/13/17 0215  NA 135 134* 134* 134*  K 5.2* 5.3* 5.0 4.4  CL 100* 98* 100* 103  CO2  21* 21*  22 21*  GLUCOSE 94 94 123* 95  BUN 35* 34* 31* 28*  CREATININE 1.09 1.05 1.10 0.71  CALCIUM 9.6 9.2 9.1 8.6*  MG  --   --  2.1  --   PHOS  --   --  2.9  --     Liver Function Tests: Recent Labs  Lab 02/10/17 1704 02/12/17 0327  AST 59* 40  ALT 45 35  ALKPHOS 70 63  BILITOT 3.3* 2.8*  PROT 6.8 6.2*  ALBUMIN 3.8 3.3*  3.3*   No results for input(s): LIPASE, AMYLASE in the last 168 hours. No results for input(s): AMMONIA in the last 168 hours.  CBC: Recent Labs  Lab 02/10/17 1958 02/11/17 0557 02/12/17 0327 02/13/17 0215  WBC 10.7* 12.4* 17.5* 11.5*  NEUTROABS 7.6  --   --   --   HGB 18.1* 17.9* 17.6*  17.7* 14.8  HCT 54.4* 53.0* 51.5  51.9 43.4  MCV 94.8 94.6 96.3 95.4  PLT PLATELET CLUMPS NOTED ON SMEAR, COUNT APPEARS ADEQUATE 104* 130* 93*    Cardiac Enzymes: Recent Labs  Lab 02/10/17 1704 02/12/17 0327  TROPONINI <0.03 <0.03    BNP: BNP (last 3 results) Recent Labs    12/04/16 0030  BNP 1,027.4*    ProBNP (last 3 results) No results for input(s): PROBNP in the last 8760 hours.   CBG: Recent Labs  Lab 02/11/17 2032  GLUCAP 119*    Coagulation Studies: Recent Labs    02/10/17 1852  LABPROT 17.3*  INR 1.43     Imaging    No results found.   Medications:     Current Medications: . aspirin EC  81 mg Oral Daily  . Chlorhexidine Gluconate Cloth  6 each Topical Q0600  . feeding supplement (ENSURE ENLIVE)  237 mL Oral BID BM  . folic acid  1 mg Oral Daily  . mometasone-formoterol  2 puff Inhalation BID  . multivitamin with minerals  1 tablet Oral Daily  . mupirocin ointment  1 application Nasal BID  . QUEtiapine  25 mg Oral QHS  . sodium chloride flush  3 mL Intravenous Q12H  . thiamine  100 mg Oral Daily     Infusions: . heparin 1,250 Units/hr (02/13/17 0700)       Patient Profile   Stephen Fleming is a 66 year old with history of  Combined systolic/diastoic heart failure, CAD (cath in 11/2016 showing60% first diagonal,  95% ostial small OM1, 70% OM2, and 50% mid PDA --> medical management recommended, moderate Stephen, HTN, OSA intolerant CPAP.   Admitted with bilateral leg pain and dyspnea. Acute DVT/PE  Assessment/Plan   1. Acute DVT Noted on [place] On heparin drip  2. Acute PE Noted on CTA On heparin drip  3. R atrial thrombus  Noted on ECHO  On heparin 4. A/C Systolic Heart Failure  ECHO EF 15% with RV  Hypotensive. Does not appear volume overloaded.  Concern for low output. Place PICC for CVP /CO-OX  Anticipate starting milrinone to support RV.  Add dig 0.125 mg  Daily No bb/arb with hypotension.  5. CAD  LHC 11/2016 60% first diagonal, 95% ostial small OM1, 70% OM2, and 50% mid PDA. 6. LBBB QRS 166 ms. Down the road would need CRT-D if improves.   Medication concerns reviewed with patient and pharmacy team. Barriers identified: not at this time.  Length of Stay: 2  Stephen Becket, NP  02/13/2017, 9:07 AM  Advanced Heart Failure Team Pager  [phone] (M-F; 7a - 4p)  Please contact [facility] for night-coverage after hours (4p -7a ) and weekends on [email or web address]  Patient seen with NP, agree with the above note.    At baseline, patient has had low output HF diagnosed in 11/18 by cath (CI 1.65).  At that time, filling pressures were also elevated (PCWP 29).  He had CAD but cardiomyopathy was out of proportion to CAD and possibly due to ETOH abuse (heavy drinker up to 10/18).  He was very short of breath with exertion.  Recently, dyspnea significantly worsened.  He went to [facility] and was found to have extensive DVT + PE => pulmonary infarct with hemoptysis.  He did not have thrombolysis.    Echo was done showing EF 10-15%, moderate LV dilation, D-shaped IV septum suggestive of RV strain with moderately decreased RV systolic function.   Currently on heparin gtt.  Still with some hemoptysis.  Very fatigued.  Slow to answer questions.  SBP in 90s, HR 100s sinus tachy.  On exam, JVP does not appear  significantly elevated.  Decreased breath sounds at bases.  1+ bilateral ankle edema.   1. DVT with PE and pulmonary infarction: Did not have thrombolysis.  Thrombus in transit seen in RA on echo. Still with some hemoptysis.  - Continue heparin gtt.   2. Acute on chronic systolic CHF: Nonischemic cardiomyopathy, out of proportion to degree of CAD.  May be related to prior heavy ETOH.  Echo (1/19) with moderate LV dilation, EF 10-15%, moderately decreased RV systolic function, D-shaped interventricular septum suggestive of RV pressure/volume overload, PASP 46 mmHg.  Low output HF by RHC in 11/18 (CI 1.65, mean PCWP 29).  He reports fatigue and is slow of speech.  SBP 90s.  He does not appear particularly volume overloaded on exam but I suspect ongoing low output HF worsened by RV failure from PE.   - Place PICC now, will get initial co-ox then start milrinone 0.25 mcg/kg/min for RV and LV support.  Low threshold for low dose norepinephrine.  - Will start digoxin.  - Follow CVP => no Lasix for now, will give if he has high CVP.  - Noted to have wide LBBB => CRT may be beneficial down the road.  - Cardiac MRI may be helpful when more stable.  3. CAD: Patient had extensive CAD on 11/18 cath but primarily nonobstructive.  Does not explain cardiomyopathy.  - Will add statin.   He is tenuous, guarded prognosis.   Stephen Fleming 02/13/2017 10:20 AM

## 2017-02-13 NOTE — Progress Notes (Signed)
ANTICOAGULATION CONSULT NOTE Pharmacy Consult for Heparin Indication: pulmonary embolus  No Known Allergies  Patient Measurements: Height: 5\' 10"  (177.8 cm) Weight: 170 lb 6.7 oz (77.3 kg) IBW/kg (Calculated) : 73 HEPARIN DW (KG): 77.2  Vital Signs: Temp: 97.7 F (36.5 C) (01/24 0723) Temp Source: Axillary (01/24 0723) BP: 97/77 (01/24 1100) Pulse Rate: 117 (01/24 1100)  Labs: Recent Labs    02/10/17 1704 02/10/17 1852  02/11/17 0557  02/12/17 0327 02/12/17 1838 02/13/17 0215 02/13/17 0943  HGB  --   --    < > 17.9*  --  17.6*  17.7*  --  14.8  --   HCT  --   --    < > 53.0*  --  51.5  51.9  --  43.4  --   PLT  --   --    < > 104*  --  130*  --  93*  --   APTT  --  35  --   --   --   --   --   --   --   LABPROT  --  17.3*  --   --   --   --   --   --   --   INR  --  1.43  --   --   --   --   --   --   --   HEPARINUNFRC  --   --   --  0.80*   < > 0.61 0.23* 0.29* 0.20*  CREATININE 1.09  --   --  1.05  --  1.10  --  0.71  --   TROPONINI <0.03  --   --   --   --  <0.03  --   --   --    < > = values in this interval not displayed.    Estimated Creatinine Clearance: 98.9 mL/min (by C-G formula based on SCr of 0.71 mg/dL).  Assessment: 54 yoM presents with PE/DVT, continuing on IV heparin. Heparin level remains subtherapeutic at 0.20 despite two rate increases in the past 24 hours. Hemoptysis has improved. Hgb down 14.8, pltc down 93. No issues with infusion confirmed with nursing. PICC line currently being placed, heparin to be infused via PICC post-placement.  Goal of Therapy:  Heparin level 0.3-0.7 units/ml Monitor platelets by anticoagulation protocol: Yes   Plan:  Will not bolus 2/2 bleeding Increase heparin gtt to 1450 units/hr Recheck heparin level in 8 hours Daily heparin level and CBC Monitor for s/sx of bleeding, particularly hemoptysis  Erin N. Zigmund Daniel, PharmD PGY1 Pharmacy Resident Pager: [phone] 02/13/17 12:03 PM

## 2017-02-13 NOTE — Progress Notes (Signed)
ANTICOAGULATION CONSULT NOTE Pharmacy Consult for heparin Indication: pulmonary embolus  No Known Allergies  Patient Measurements: Height: 5\' 10"  (177.8 cm) Weight: 169 lb 8.5 oz (76.9 kg) IBW/kg (Calculated) : 73 HEPARIN DW (KG): 77.2  Vital Signs: Temp: 97.4 F (36.3 C) (01/23 2354) Temp Source: Oral (01/23 2354) BP: 86/66 (01/24 0200) Pulse Rate: 103 (01/24 0300)  Labs: Recent Labs    02/10/17 1704 02/10/17 1852  02/10/17 1958 02/11/17 0557  02/12/17 0327 02/12/17 1838 02/13/17 0215  HGB  --   --    < > 18.1* 17.9*  --  17.6*  17.7*  --   --   HCT  --   --   --  54.4* 53.0*  --  51.5  51.9  --   --   PLT  --   --   --  PLATELET CLUMPS NOTED ON SMEAR, COUNT APPEARS ADEQUATE 104*  --  130*  --   --   APTT  --  35  --   --   --   --   --   --   --   LABPROT  --  17.3*  --   --   --   --   --   --   --   INR  --  1.43  --   --   --   --   --   --   --   HEPARINUNFRC  --   --   --   --  0.80*   < > 0.61 0.23* 0.29*  CREATININE 1.09  --   --   --  1.05  --  1.10  --   --   TROPONINI <0.03  --   --   --   --   --  <0.03  --   --    < > = values in this interval not displayed.    Estimated Creatinine Clearance: 71.9 mL/min (by C-G formula based on SCr of 1.1 mg/dL).  Assessment: 62 y.o. male with PE/DVT for heparin.   Goal of Therapy:  Heparin level 0.3-0.7 units/ml Monitor platelets by anticoagulation protocol: Yes   Plan:  Increase Heparin 1250 units/hr Check heparin level in 6 hours.     Geannie Risen, PharmD, BCPS  02/13/2017 3:01 AM

## 2017-02-13 NOTE — Progress Notes (Signed)
PULMONARY / CRITICAL CARE MEDICINE   Name: Fransico Sciandra MRN: [id number] DOB: 05-29-1955    ADMISSION DATE:  02/10/2017 CONSULTATION DATE:  02/11/2017  REFERRING MD:  Dr. Arbutus Leas  CHIEF COMPLAINT:  Dyspnea on exertion, cough, hemoptysis   HISTORY OF PRESENT ILLNESS:   62 year old male with past medical history significant for systolic heart failure, coronary artery disease, hypertension, and gout who presented to [facility] on 1/21 with worsening exertional shortness of breath lasting 2 days, leg swelling, chest pain, and cough.    At baseline patient has very little physical reserve, but is able to walk ~ 10 feet without significant issue. Normally rides a scooter when shopping.  Denied fever, chills, sick contacts.    Found on ultrasound to have acute bilateral lower extremity DVT.  Started on heparin drip and admitted to [facility] in SDU.  CTA chest demonstrated acute submassive PE with RV/LV ratio of 1, and TTE with evidence of right heart strain. He developed mild hemoptysis on the afternoon of 1/22, systolic BP 80-90's, however he was given his normal blood pressure medicines and lasix earlier.  There was concern that patient may need IR intervention and therefore transferred to [facility] on PCCM service.  Patient has remained stable on room air and on review of prior visits, baseline systolic BP runs in the 90's.    REVIEW OF SYSTEMS:  POSITIVES IN BOLD Gen: Denies fever, chills, weight change, fatigue,  PULM: Denies shortness of breath with exertion, cough, sputum production, hemoptysis, wheezing CV: Denies chest pain with inspiration, BLE edema but at baseline, orthopnea,  GI: Denies abdominal pain, nausea, vomiting, diarrhea, hematochezia, melena, constipation, change in bowel habits - dark when constipated  GU: Denies dysuria, hematuria Heme: Denies easy bruising, bleeding Neuro: Denies headache, numbness, loss of memory or consciousness  SUBJECTIVE:  Patient comfortable but hungry. He  would like to eat. Denied   VITAL SIGNS: BP 93/75   Pulse (!) 105   Temp (!) 97.5 F (36.4 C) (Axillary)   Resp 18   Ht 5\' 10"  (1.778 m)   Wt 170 lb 6.7 oz (77.3 kg)   SpO2 97%   BMI 24.45 kg/m   INTAKE / OUTPUT: I/O last 3 completed shifts: In: 586.2 [P.O.:120; I.V.:466.2] Out: 2225 [Urine:2225]  PHYSICAL EXAMINATION: General: Adult male - appears fatigued, sitting up in bed in NAD, resting comfortably HEENT: [place]/AT, blood in oral cavity and lining lips, pupils equal round/ reactive, PERRL Neuro: fatigued, alert, oriented x 3 CV: RRR, S4 noted PULM: Lungs clear to auscultation bilaterally with minimal decrease in LLL, > 98% on RA GI: soft, non-tender, bs active  Extremities: cool/ dry, RLE swelling > LLE, +2 ankle edema  Skin: no rashes  LABS:   BMET Recent Labs  Lab 02/11/17 0557 02/12/17 0327 02/13/17 0215  NA 134* 134* 134*  K 5.3* 5.0 4.4  CL 98* 100* 103  CO2 21* 22 21*  BUN 34* 31* 28*  CREATININE 1.05 1.10 0.71  GLUCOSE 94 123* 95   Electrolytes Recent Labs  Lab 02/11/17 0557 02/12/17 0327 02/13/17 0215  CALCIUM 9.2 9.1 8.6*  MG  --  2.1  --   PHOS  --  2.9  --    CBC Recent Labs  Lab 02/11/17 0557 02/12/17 0327 02/13/17 0215  WBC 12.4* 17.5* 11.5*  HGB 17.9* 17.6*  17.7* 14.8  HCT 53.0* 51.5  51.9 43.4  PLT 104* 130* 93*   Coag's Recent Labs  Lab 02/10/17 1852  APTT 35  INR 1.43   Sepsis Markers No results for input(s): LATICACIDVEN, PROCALCITON, O2SATVEN in the last 168 hours.  ABG No results for input(s): PHART, PCO2ART, PO2ART in the last 168 hours.  Liver Enzymes Recent Labs  Lab 02/10/17 1704 02/12/17 0327  AST 59* 40  ALT 45 35  ALKPHOS 70 63  BILITOT 3.3* 2.8*  ALBUMIN 3.8 3.3*  3.3*   Cardiac Enzymes Recent Labs  Lab 02/10/17 1704 02/12/17 0327  TROPONINI <0.03 <0.03   Glucose Recent Labs  Lab 02/11/17 2032  GLUCAP 119*   Imaging No results found. STUDIES:  1/21 Bilateral Venous US >>  1.  Occlusive DVT in LEFT posterior tibial and popliteal veins. 2. Occlusive DVT in RIGHT posterior tibial, popliteal, and femoral Veins.  1/22 CTA chest >>  1.  Extensive partially occlusive bilateral pulmonary emboli with RV/LV ratio of 1.0 implying increased morbidity mortality. Positive for acute PE with CT evidence of right heart strain (RV/LV Ratio = 1.0) consistent with at least submassive (intermediate risk) PE.  2.  Parenchymal opacity within much of the left lower lobe and a small portion of the posterosuperior left upper lobe could represent pneumonia or developing infarction. No pleural effusion. 3.  Apparent partial atrophy of the left kidney with some compensatory hypertrophy of the right kidney. Low-attenuation structure emanates from the periphery the right mid kidney difficult to assess on this study.  1/22 TTE >>  LVEF 10-15%; moderately dilated LV, diffuse hypokinesis, akinesis of basal mid anterior septal myocardium, mild AR, mild MV calcified annulus and mild MR; LA severely dilated, RV septum flattend in systole and diastole c/w RV pressure and volume overload with moderately reduced systolic function, a semimobile linear structure in the right, atrium that likely represents a thrombus,  atrium was moderately dilated, no PFO or defect; mod TR, PAP 46 mm Hg; IVC dilated with blunted respirophasic changes  CULTURES: 1/22 MRSA PCR >>  ANTIBIOTICS: N/A  SIGNIFICANT EVENTS: 1/21 Admit [facility] 1/22 Tx to [facility] MICU  LINES/TUBES: PIVs x 2   DISCUSSION: 5 yoM with PMH of systolic HF, HLD, and recent reduction in activity tolerance since October 2018 presenting with chest pain, worsening dyspnea on exertion, and BLE swelling since 1/17 found to have acute BLE DVTs and submassive PE with acute right heart strain.  Patient has been mostly at his baseline with hemodynamics and stable on room air.    ASSESSMENT / PLAN:  PULMONARY A: Submassive PE with acute right heart strain  - likely from acute reduction in activity since 10/2016. LLL atelectasis- likely pulmonary infarct  Hemoptysis  IR believes RV pressure elevation to be at baseline despite bilateral PE. Given that fact and the patients hemoptysis they do not recommend ECHOS. Recommend placing IVC filter if hemoptysis worsens  VBG on 01/22 7.454/32.1/44/22.8 P:   Continue heparin gtt  Monitor or anemia or rapid blood loss O2 prn for sats > 94% or for dyspnea  ABG prn   CARDIOVASCULAR A:  Systolic HF - LVEF from 20-25% (11/2016)/ PAP 62 mm Hg  - 1/22 TTE LVEF 10-15%/ PAP 47 mm Hg - previous LHC 12/04/2016 thought not severe enough to explain LV dysfunction, possibly due to chronic ETOH use? Troponin trend unremarkable Cardiology recommending holding BB and diuretic given RH failure. Otherwise in agreement with current management as per primary team.  P:  Tele monitoring Goal MAP > 65 Continue heparin gtt  RENAL A:   Mild hyperkalemia resolved Magnesium 2.1 on 1/23 Appears to be euvolemic P:  Trend BMP/ urinary output/ daily weights Replace electrolytes as indicated Avoid nephrotoxic agents, ensure adequate renal perfusion  GASTROINTESTINAL A:   Mild Transaminitis resolved Hx ETOH- denies use since Oct 2018  P:   Heart healthy diet  HEMATOLOGIC A:   Hgb 14.8 today Thrombocytopenia Acute DVT BLE Heparin level subtherapeutic, appreciate pharmacies assistance  P:  Monitor for bleeding/ increased hemoptysis  Continue systemic heparin gtt for now; hold if hemoptysis significantly increases Trend CBC  INFECTIOUS A:   Mild leukocytosis improving- likely reactive - LLL atelectasis likely pulmonary infarct as seen on CT  P:   Trend WBC/ fever curve-unremarkable to date Monitor clinically  ENDOCRINE A:   No acute issues  P:   Trend glucose on BMET  NEUROLOGIC A:   Hx of ETOH Patient reported he stopped drinking 10/2016 P:   Monitor for EtOH withdraw with CIWA- high score  2  FAMILY  - Updates: Patient and family member updated at bedside by Dr. Merlene Pulling.  Patient states his wife can make decisions for him, but as for admission, he was agreeable to aggressive measures including intubation, CPR, EKOS, systemic thrombolytics if needed.    - Inter-disciplinary family meet or Palliative Care meeting due by:  02/18/2017   Lanelle Bal, MD PCCM resident  Pager #: [phone]  Attending Note:  62 year old male presenting with a submassive PE who was transferred to the ICU for ?EKOS.  On exam, bibasilar crackles.  I reviewed CT myself, PE noted.  Discussed with PCCM-NP and TRH-MD.  PE:  - Heparin  - NOAC vs coumadin  CHF:  - Tele monitoring  - Hold all anti-HTN  - Cards following  Hyperkalemia:  - BMET in AM  Hypoxemia:  - Titrate O2 for sat of 88-92%  Transfer to SDU an to [facility] with PCCM off 1/25.  Patient seen and examined, agree with above note.  I dictated the care and orders written for this patient under my direction.  Alyson Reedy, MD [phone]

## 2017-02-14 LAB — BASIC METABOLIC PANEL WITH GFR
Anion gap: 10 (ref 5–15)
BUN: 19 mg/dL (ref 6–20)
CO2: 24 mmol/L (ref 22–32)
Calcium: 8.4 mg/dL — ABNORMAL LOW (ref 8.9–10.3)
Chloride: 101 mmol/L (ref 101–111)
Creatinine, Ser: 0.64 mg/dL (ref 0.61–1.24)
GFR calc Af Amer: >60 mL/min
GFR calc non Af Amer: >60 mL/min
Glucose, Bld: 106 mg/dL — ABNORMAL HIGH (ref 65–99)
Potassium: 4.1 mmol/L (ref 3.5–5.1)
Sodium: 135 mmol/L (ref 135–145)

## 2017-02-14 LAB — CBC
HCT: 39.5 % (ref 39.0–52.0)
Hemoglobin: 13.7 g/dL (ref 13.0–17.0)
MCH: 32.2 pg (ref 26.0–34.0)
MCHC: 34.7 g/dL (ref 30.0–36.0)
MCV: 92.9 fL (ref 78.0–100.0)
PLATELETS: 107 10*3/uL — AB (ref 150–400)
RBC: 4.25 MIL/uL (ref 4.22–5.81)
RDW: 14.4 % (ref 11.5–15.5)
WBC: 12.2 10*3/uL — AB (ref 4.0–10.5)

## 2017-02-14 LAB — LUPUS ANTICOAGULANT PANEL
DRVVT: 59.1 s — ABNORMAL HIGH (ref 0.0–47.0)
PTT Lupus Anticoagulant: >160 s — ABNORMAL HIGH (ref 0.0–51.9)

## 2017-02-14 LAB — HEPARIN LEVEL (UNFRACTIONATED)
HEPARIN UNFRACTIONATED: 0.44 [IU]/mL (ref 0.30–0.70)
Heparin Unfractionated: 0.54 IU/mL (ref 0.30–0.70)

## 2017-02-14 LAB — COOXEMETRY PANEL
CARBOXYHEMOGLOBIN: 1.6 % — AB (ref 0.5–1.5)
Methemoglobin: 0.8 % (ref 0.0–1.5)
O2 Saturation: 72.7 %
Total hemoglobin: 14 g/dL (ref 12.0–16.0)

## 2017-02-14 LAB — PTT-LA MIX: PTT-LA Mix: >160 s — ABNORMAL HIGH (ref 0.0–48.9)

## 2017-02-14 LAB — DRVVT MIX: dRVVT Mix: 41.4 s (ref 0.0–47.0)

## 2017-02-14 LAB — HEXAGONAL PHASE PHOSPHOLIPID: HEXAGONAL PHASE PHOSPHOLIPID: 0 s (ref 0–11)

## 2017-02-14 MED ORDER — TRAMADOL HCL 50 MG PO TABS
50.0000 mg | ORAL_TABLET | Freq: Four times a day (QID) | ORAL | Status: DC | PRN
  Administered 2017-02-14 – 2017-02-24 (×10): 50 mg via ORAL
  Filled 2017-02-14 (×10): qty 1

## 2017-02-14 NOTE — Progress Notes (Signed)
ANTICOAGULATION CONSULT NOTE Pharmacy Consult for Heparin Indication: pulmonary embolus  No Known Allergies  Patient Measurements: Height: 5\' 10"  (177.8 cm) Weight: 169 lb 12.1 oz (77 kg) IBW/kg (Calculated) : 73 HEPARIN DW (KG): 77.2  Vital Signs: Temp: 98 F (36.7 C) (01/25 0808) Temp Source: Oral (01/25 0808) BP: 91/69 (01/25 0800) Pulse Rate: 116 (01/25 0800)  Labs: Recent Labs    02/12/17 0327  02/13/17 0215  02/13/17 2007 02/14/17 0248 02/14/17 0834  HGB 17.6*  17.7*  --  14.8  --   --  13.7  --   HCT 51.5  51.9  --  43.4  --   --  39.5  --   PLT 130*  --  93*  --   --  107*  --   HEPARINUNFRC 0.61   < > 0.29*   < > 0.20* 0.44 0.54  CREATININE 1.10  --  0.71  --   --  0.64  --   TROPONINI <0.03  --   --   --   --   --   --    < > = values in this interval not displayed.    Estimated Creatinine Clearance: 98.9 mL/min (by C-G formula based on SCr of 0.64 mg/dL).  Assessment: 33 yoM presents with PE/DVT, continuing on IV heparin.   Hep lvl now therapeutic at 0.44 on 1650 units/hr  CBC stable   Still with hemoptysis - but improving from yesterday per RN  Goal of Therapy:  Heparin level 0.3-0.7 units/ml Monitor platelets by anticoagulation protocol: Yes   Plan:  Continue heparin 1650 units/hr Daily heparin level and CBC Monitor for s/sx of bleeding, particularly hemoptysis F/U plans for oral anticoag once bleeding resolves?  Isaac Bliss, PharmD, BCPS, BCCCP Clinical Pharmacist Clinical phone for 02/14/2017 from 7a-3:30p: [phone] If after 3:30p, please call main pharmacy at: [phone] 02/14/2017 9:41 AM

## 2017-02-14 NOTE — Progress Notes (Signed)
ANTICOAGULATION CONSULT NOTE - Follow Up Consult  Pharmacy Consult for heparin Indication: PE/DVT  Labs: Recent Labs    02/11/17 0557  02/12/17 0327  02/13/17 0215 02/13/17 0943 02/13/17 2007 02/14/17 0248  HGB 17.9*  --  17.6*  17.7*  --  14.8  --   --  13.7  HCT 53.0*  --  51.5  51.9  --  43.4  --   --  39.5  PLT 104*  --  130*  --  93*  --   --  107*  HEPARINUNFRC 0.80*   < > 0.61   < > 0.29* 0.20* 0.20* 0.44  CREATININE 1.05  --  1.10  --  0.71  --   --   --   TROPONINI  --   --  <0.03  --   --   --   --   --    < > = values in this interval not displayed.    Assessment/Plan:  62yo male therapeutic on heparin after rate change. Will continue gtt at current rate and confirm stable with additional level.   Vernard Gambles, PharmD, BCPS  02/14/2017,3:29 AM

## 2017-02-14 NOTE — Progress Notes (Signed)
Advanced Heart Failure Rounding Note  PCP:  Primary Cardiologist: Dr Eden Emms   Subjective:   Yesterday started on milrinone. CO-OX 73%.   SOB with exertion. Chest discomfort when he is coughing.   Objective:   Weight Range: 169 lb 12.1 oz (77 kg) Body mass index is 24.36 kg/m.   Vital Signs:   Temp:  [97.8 F (36.6 C)-98.8 F (37.1 C)] 98 F (36.7 C) (01/25 0808) Pulse Rate:  [57-130] 116 (01/25 0800) Resp:  [20-35] 22 (01/25 0800) BP: (84-105)/(61-82) 91/69 (01/25 0800) SpO2:  [90 %-96 %] 94 % (01/25 0800) Weight:  [169 lb 12.1 oz (77 kg)] 169 lb 12.1 oz (77 kg) (01/25 0433) Last BM Date: 02/10/17  Weight change: Filed Weights   02/12/17 0441 02/13/17 0426 02/14/17 0433  Weight: 169 lb 8.5 oz (76.9 kg) 170 lb 6.7 oz (77.3 kg) 169 lb 12.1 oz (77 kg)    Intake/Output:   Intake/Output Summary (Last 24 hours) at 02/14/2017 0857 Last data filed at 02/14/2017 0800 Gross per 24 hour  Intake 701.2 ml  Output 1575 ml  Net -873.8 ml      Physical Exam   CVP 4  General:  Appears weak No resp difficulty HEENT: Normal Neck: Supple. JVP flat . Carotids 2+ bilat; no bruits. No lymphadenopathy or thyromegaly appreciated. Cor: PMI nondisplaced. Tachy Regular rate & rhythm. No rubs, gallops or murmurs. Lungs: Clear Abdomen: Soft, nontender, nondistended. No hepatosplenomegaly. No bruits or masses. Good bowel sounds. Extremities: No cyanosis, clubbing, rash, edema R and LLE warm. RUE pICC Neuro: Alert & orientedx3, cranial nerves grossly intact. moves all 4 extremities w/o difficulty. Affect pleasant   Telemetry   Sinus Tach wit PVCs NSVT 120s   EKG    N/A   Labs    CBC Recent Labs    02/13/17 0215 02/14/17 0248  WBC 11.5* 12.2*  HGB 14.8 13.7  HCT 43.4 39.5  MCV 95.4 92.9  PLT 93* 107*   Basic Metabolic Panel Recent Labs    16/10/96 0327 02/13/17 0215 02/14/17 0248  NA 134* 134* 135  K 5.0 4.4 4.1  CL 100* 103 101  CO2 22 21* 24  GLUCOSE 123*  95 106*  BUN 31* 28* 19  CREATININE 1.10 0.71 0.64  CALCIUM 9.1 8.6* 8.4*  MG 2.1  --   --   PHOS 2.9  --   --    Liver Function Tests Recent Labs    02/12/17 0327  AST 40  ALT 35  ALKPHOS 63  BILITOT 2.8*  PROT 6.2*  ALBUMIN 3.3*  3.3*   No results for input(s): LIPASE, AMYLASE in the last 72 hours. Cardiac Enzymes Recent Labs    02/12/17 0327  TROPONINI <0.03    BNP: BNP (last 3 results) Recent Labs    12/04/16 0030  BNP 1,027.4*    ProBNP (last 3 results) No results for input(s): PROBNP in the last 8760 hours.   D-Dimer No results for input(s): DDIMER in the last 72 hours. Hemoglobin A1C No results for input(s): HGBA1C in the last 72 hours. Fasting Lipid Panel No results for input(s): CHOL, HDL, LDLCALC, TRIG, CHOLHDL, LDLDIRECT in the last 72 hours. Thyroid Function Tests No results for input(s): TSH, T4TOTAL, T3FREE, THYROIDAB in the last 72 hours.  Invalid input(s): FREET3  Other results:   Imaging     No results found.   Medications:     Scheduled Medications: . aspirin EC  81 mg Oral Daily  .  atorvastatin  40 mg Oral q1800  . Chlorhexidine Gluconate Cloth  6 each Topical Q0600  . digoxin  0.125 mg Oral Daily  . feeding supplement (ENSURE ENLIVE)  237 mL Oral BID BM  . folic acid  1 mg Oral Daily  . mometasone-formoterol  2 puff Inhalation BID  . multivitamin with minerals  1 tablet Oral Daily  . mupirocin ointment  1 application Nasal BID  . QUEtiapine  25 mg Oral QHS  . sodium chloride flush  10-40 mL Intracatheter Q12H  . sodium chloride flush  3 mL Intravenous Q12H  . thiamine  100 mg Oral Daily     Infusions: . heparin 1,650 Units/hr (02/14/17 0027)  . milrinone 0.25 mcg/kg/min (02/14/17 0429)     PRN Medications:  acetaminophen **OR** acetaminophen, albuterol, diazepam, LORazepam **OR** LORazepam, ondansetron **OR** ondansetron (ZOFRAN) IV, sodium chloride flush, traMADol    Patient Profile   Stephen Fleming is a 62  year old with history of  Combined systolic/diastoic heart failure, CAD (cath in 11/2016 showing60% first diagonal, 95% ostial small OM1, 70% OM2, and 50% mid PDA--> medical management recommended, moderate Stephen, HTN, OSA intolerant CPAP.   Admitted with bilateral leg pain and dyspnea. Acute DVT/PE    Assessment/Plan   1. Acute DVT/PE Noted on [place] and CTA  On heparin drip  2. R atrial thrombus  Noted on ECHO  On heparin 3. A/C Systolic Heart Failure  ECHO EF 15% with RV  SBP low. CVP 4.  Continue milrinone 0.25 mcg for RV and LV support. CO-OX improved 73%.  Continue dig 0.125 mg daily.  No bb/arb with hypotension.  4. CAD  LHC 11/2016 60% first diagonal, 95% ostial small OM1, 70% OM2, and 50% mid PDA. On statin 5. LBBB QRS 166 ms. Down the road would need CRT-D if improves.   Medication concerns reviewed with patient and pharmacy team. Barriers identified: not at this time.  Length of Stay: 3  Amy Clegg, NP  02/14/2017, 8:57 AM  Advanced Heart Failure Team Pager [phone] (M-F; 7a - 4p)  Please contact [facility] for night-coverage after hours (4p -7a ) and weekends on [email or web address]  Patient seen with NP, agree with the above note.    At baseline, patient has had low output HF diagnosed in 11/18 by cath (CI 1.65).  At that time, filling pressures were also elevated (PCWP 29).  He had CAD but cardiomyopathy was out of proportion to CAD and possibly due to ETOH abuse (heavy drinker up to 10/18).  He was very short of breath with exertion.  Recently, dyspnea significantly worsened.  He went to [facility] and was found to have extensive DVT + PE => pulmonary infarct with hemoptysis.  He did not have thrombolysis.    Echo was done showing EF 10-15%, moderate LV dilation, D-shaped IV septum suggestive of RV strain with moderately decreased RV systolic function.   Currentlyn remains on heparin gtt.  Still with some hemoptysis but decreasing.  Still very fatigued.  Started on  milrinone 1/24 with improvement in co-ox.  SBP in 90s, HR 100s sinus tachy.  On exam, JVP does not appear significantly elevated.  Decreased breath sounds at bases.  2+ ankle edema R>L.  Extremities warm.    1. DVT with PE and pulmonary infarction: Did not have thrombolysis.  Thrombus in transit seen in RA on echo. Still with some hemoptysis but improving.   - Continue heparin gtt.   2. Acute on chronic systolic CHF: Nonischemic  cardiomyopathy, out of proportion to degree of CAD.  May be related to prior heavy ETOH.  Echo (1/19) with moderate LV dilation, EF 10-15%, moderately decreased RV systolic function, D-shaped interventricular septum suggestive of RV pressure/volume overload, PASP 46 mmHg.  Low output HF by RHC in 11/18 (CI 1.65, mean PCWP 29).  Low co-ox initially at 55%, started on milrinone with co-ox up to 73%. Patient much warmer on exam. SBP 90s. Creatinine improved.  CVP 4.  - Continue milrinone 0.25.  - Continue digoxin.  - Follow CVP => no Lasix for now with CVP 4.   - Noted to have wide LBBB => CRT may be beneficial down the road.  - Cardiac MRI may be helpful when more stable.  3. CAD: Patient had extensive CAD on 11/18 cath but primarily nonobstructive.  Does not explain cardiomyopathy.  - Continue statin.   [name] 02/14/2017 1:53 PM

## 2017-02-14 NOTE — Plan of Care (Signed)
  Not Progressing Pain Managment: General experience of comfort will improve 02/14/2017 0653 - Not Progressing by Joanathan Affeldt A, RN Note Patient continues to complain of chest pain from coughing. Currently only with orders for tylenol which is not relieving his pain.

## 2017-02-14 NOTE — Progress Notes (Signed)
PROGRESS NOTE  Stephen Fleming  YSH:[id number] DOB: October 27, 1955 DOA: 02/10/2017 PCP: Louie Boston., MD   Brief Narrative: Stephen Fleming is a 62 y.o. male with a history of chronic HFrEF, alcohol abuse (stopped Nov 2018), CAD [facility] Nov 2018 showed diffuse stenoses managed medically), HTN, OSA intolerant of CPAP, and gout who presented to [facility] 1/21 with bilateral leg pain, hemoptysis, severe dyspnea on exertion, found to have bilateral LE DVT's and acute submassive PE on CTA with RV/LV of 1, right heart strain noted on TTE. He was placed on heparin, somewhat hypotensive and was transferred to [facility] for consideration of thrombolytics. No thrombolytics were indicated. Heart failure team was consulted for acute systolic LV and right heart failure, echo showing started on milrinone  Assessment & Plan: Active Problems:   Dyspnea   Chronic combined systolic and diastolic CHF (congestive heart failure) (HCC)   DVT of lower extremity, bilateral (HCC)   Hemoptysis   Acute pulmonary embolism (HCC)  Acute hypoxic respiratory failure: Due to PE, acute CHF.  - Supplemental oxygen prn - Treat conditions as below.   Acute submassive PE with right heart strain and bilateral acute DVTs: Suspect LLL atelectasis due to pulmonary infarct.  - Continue IV heparin, appreciate pharmacy assistance in dosing.  - Monitor CBC with ongoing hemoptysis  Acute on chronic systolic CHF: EF worsened 20-25% -> 10-15%. Possibly related to chronic EtOH in addition to medically managed CAD.  - Appreciate HF team involvement. On milrinone. - CVP and SBP soft, on milrinone. Not antihypertensive candidate currently.   CAD w/LBBB: LHC Nov 2018 with  - Continue statin, heparin - Per HF, CRT pending clinical course  Right atrial thrombus:  - Continue heparin.   EtOH abuse: Not recently. CIWA scores very low.  - Continue supplements, DC CIWA  DVT prophylaxis: Heparin Code Status: Full Family Communication: None at  bedside Disposition Plan: Uncertain. Not stable for therapy currently.   Consultants:   PCCM primary through 1/24  HF team  Procedures:  Echo 02/11/2017 Left ventricle: The cavity size was moderately dilated. Wall thickness was normal. The estimated ejection fraction was in the range of 10% to 15%. Diffuse hypokinesis. The study is not technically sufficient to allow evaluation of LV diastolic function. - Regional wall motion abnormality: Akinesis of the basal-mid anteroseptal myocardium. - Aortic valve: There was mild regurgitation. Valve area (VTI): 3.88 cm^2. Valve area (Vmax): 3.22 cm^2. Valve area (Vmean): 3.23 cm^2. - Mitral valve: Mildly calcified annulus. There was mild regurgitation. - Left atrium: The atrium was severely dilated. - Right ventricle: The ventricular septum is flattend in systole and diastole consistent with RV pressure and volume overload. Systolic function was moderately reduced. TAPSE: 12.6 mm . - Right atrium: There is a semimobile linear structure in the right atrium that likely represents a thrombus. The atrium was moderately dilated. - Atrial septum: No defect or patent foramen ovale was identified. - Tricuspid valve: There was moderate regurgitation. - Pulmonary arteries: Systolic pressure was moderately increased. PA peak pressure: 46 mm Hg (S). - Inferior vena cava: The vessel was dilated. The respirophasic diameter changes were blunted (<50%), consistent with elevated central venous pressure.  ECHO 11/2016 EF 20-25%  RV mild/mod dilated. RV normal.     LHC/RHC 12/04/2016  Prox RCA to Mid RCA lesion is 30% stenosed.  RPDA lesion is 50% stenosed.  Mid LAD lesion is 25% stenosed.  Prox LAD lesion is 20% stenosed.  Ost 1st Diag lesion is 60% stenosed.  Ost 1st Mrg  lesion is 95% stenosed.  Ost 2nd Mrg lesion is 70% stenosed.  Hemodynamic findings consistent with moderate pulmonary  hypertension. 1. CAD with 60% first diagonal, 95% ostial small OM1, 70% OM2, and 50% mid PDA 2. Moderate pulmonary HTN with mean PAP 37 mm Hg 3. Moderately elevated LV filling pressures with PCWP of 29 mm Hg 4. Reduced CO with index 1.65.   Antimicrobials:  None  Subjective: Feels short of breath, but declining nasal cannula oxygen. L > R chest pain with deep inspiration or coughing. Still with intermittent hemoptysis.   Objective: Vitals:   02/14/17 1000 02/14/17 1022 02/14/17 1030 02/14/17 1100  BP: 102/80  99/75 95/79  Pulse: (!) 122 (!) 124 (!) 120 (!) 122  Resp: (!) 29 (!) 35 (!) 24 (!) 22  Temp:      TempSrc:      SpO2: 92% 92% 94% 93%  Weight:      Height:        Intake/Output Summary (Last 24 hours) at 02/14/2017 1217 Last data filed at 02/14/2017 1100 Gross per 24 hour  Intake 718.1 ml  Output 1275 ml  Net -556.9 ml   Filed Weights   02/12/17 0441 02/13/17 0426 02/14/17 0433  Weight: 76.9 kg (169 lb 8.5 oz) 77.3 kg (170 lb 6.7 oz) 77 kg (169 lb 12.1 oz)    Gen: 62 y.o. male in no distress  Pulm: Accessory muscle use noted on room air, 88-92% SpO2. No wheezes.  CV: Regular tachycardia. No murmur, rub, or gallop. No JVD. GI: Abdomen soft, non-tender, non-distended, with normoactive bowel sounds. No organomegaly or masses felt. Ext: Warm, bilateral LE edema. Skin: RUE PICC c/d/i Neuro: Alert and oriented. No focal neurological deficits. Psych: Judgement and insight appear normal. Mood & affect appropriate.   Data Reviewed: I have personally reviewed following labs and imaging studies  CBC: Recent Labs  Lab 02/10/17 1958 02/11/17 0557 02/12/17 0327 02/13/17 0215 02/14/17 0248  WBC 10.7* 12.4* 17.5* 11.5* 12.2*  NEUTROABS 7.6  --   --   --   --   HGB 18.1* 17.9* 17.6*  17.7* 14.8 13.7  HCT 54.4* 53.0* 51.5  51.9 43.4 39.5  MCV 94.8 94.6 96.3 95.4 92.9  PLT PLATELET CLUMPS NOTED ON SMEAR, COUNT APPEARS ADEQUATE 104* 130* 93* 107*   Basic Metabolic  Panel: Recent Labs  Lab 02/10/17 1704 02/11/17 0557 02/12/17 0327 02/13/17 0215 02/14/17 0248  NA 135 134* 134* 134* 135  K 5.2* 5.3* 5.0 4.4 4.1  CL 100* 98* 100* 103 101  CO2 21* 21* 22 21* 24  GLUCOSE 94 94 123* 95 106*  BUN 35* 34* 31* 28* 19  CREATININE 1.09 1.05 1.10 0.71 0.64  CALCIUM 9.6 9.2 9.1 8.6* 8.4*  MG  --   --  2.1  --   --   PHOS  --   --  2.9  --   --    GFR: Estimated Creatinine Clearance: 98.9 mL/min (by C-G formula based on SCr of 0.64 mg/dL). Liver Function Tests: Recent Labs  Lab 02/10/17 1704 02/12/17 0327  AST 59* 40  ALT 45 35  ALKPHOS 70 63  BILITOT 3.3* 2.8*  PROT 6.8 6.2*  ALBUMIN 3.8 3.3*  3.3*   No results for input(s): LIPASE, AMYLASE in the last 168 hours. No results for input(s): AMMONIA in the last 168 hours. Coagulation Profile: Recent Labs  Lab 02/10/17 1852  INR 1.43   Cardiac Enzymes: Recent Labs  Lab 02/10/17 1704 02/12/17  0327  TROPONINI <0.03 <0.03   BNP (last 3 results) No results for input(s): PROBNP in the last 8760 hours. HbA1C: No results for input(s): HGBA1C in the last 72 hours. CBG: Recent Labs  Lab 02/11/17 2032  GLUCAP 119*   Lipid Profile: No results for input(s): CHOL, HDL, LDLCALC, TRIG, CHOLHDL, LDLDIRECT in the last 72 hours. Thyroid Function Tests: No results for input(s): TSH, T4TOTAL, FREET4, T3FREE, THYROIDAB in the last 72 hours. Anemia Panel: No results for input(s): VITAMINB12, FOLATE, FERRITIN, TIBC, IRON, RETICCTPCT in the last 72 hours. Urine analysis:    Component Value Date/Time   COLORURINE YELLOW 06/18/2015 1720   APPEARANCEUR CLEAR 06/18/2015 1720   LABSPEC 1.015 06/18/2015 1720   PHURINE 6.0 06/18/2015 1720   GLUCOSEU NEGATIVE 06/18/2015 1720   HGBUR NEGATIVE 06/18/2015 1720   BILIRUBINUR NEGATIVE 06/18/2015 1720   KETONESUR NEGATIVE 06/18/2015 1720   PROTEINUR NEGATIVE 06/18/2015 1720   NITRITE NEGATIVE 06/18/2015 1720   LEUKOCYTESUR NEGATIVE 06/18/2015 1720    Recent Results (from the past 240 hour(s))  MRSA PCR Screening     Status: Abnormal   Collection Time: 02/11/17  7:59 PM  Result Value Ref Range Status   MRSA by PCR POSITIVE (A) NEGATIVE Final    Comment:        The GeneXpert MRSA Assay (FDA approved for NASAL specimens only), is one component of a comprehensive MRSA colonization surveillance program. It is not intended to diagnose MRSA infection nor to guide or monitor treatment for MRSA infections. RESULT CALLED TO, READ BACK BY AND VERIFIED WITH: H DADAMO RN 02/12/17 119 JDW       Radiology Studies: No results found.  Scheduled Meds: . aspirin EC  81 mg Oral Daily  . atorvastatin  40 mg Oral q1800  . Chlorhexidine Gluconate Cloth  6 each Topical Q0600  . digoxin  0.125 mg Oral Daily  . feeding supplement (ENSURE ENLIVE)  237 mL Oral BID BM  . folic acid  1 mg Oral Daily  . mometasone-formoterol  2 puff Inhalation BID  . multivitamin with minerals  1 tablet Oral Daily  . mupirocin ointment  1 application Nasal BID  . QUEtiapine  25 mg Oral QHS  . sodium chloride flush  10-40 mL Intracatheter Q12H  . sodium chloride flush  3 mL Intravenous Q12H  . thiamine  100 mg Oral Daily   Continuous Infusions: . heparin 1,650 Units/hr (02/14/17 0027)  . milrinone 0.25 mcg/kg/min (02/14/17 0429)     LOS: 3 days   Time spent: 35 minutes.  Hazeline Junker, MD [facility] Pager [phone]  If 7PM-7AM, please contact night-coverage [email or web address] Password [facility] 02/14/2017, 12:17 PM

## 2017-02-15 DIAGNOSIS — Z791 Long term (current) use of non-steroidal anti-inflammatories (NSAID): Secondary | ICD-10-CM

## 2017-02-15 DIAGNOSIS — R195 Other fecal abnormalities: Secondary | ICD-10-CM

## 2017-02-15 LAB — CBC
HCT: 39 % (ref 39.0–52.0)
HEMATOCRIT: 40.2 % (ref 39.0–52.0)
Hemoglobin: 13.8 g/dL (ref 13.0–17.0)
Hemoglobin: 13.3 g/dL (ref 13.0–17.0)
MCH: 32.2 pg (ref 26.0–34.0)
MCH: 31.9 pg (ref 26.0–34.0)
MCHC: 34.3 g/dL (ref 30.0–36.0)
MCHC: 34.1 g/dL (ref 30.0–36.0)
MCV: 93.9 fL (ref 78.0–100.0)
MCV: 93.5 fL (ref 78.0–100.0)
PLATELETS: 166 10*3/uL (ref 150–400)
Platelets: 175 10*3/uL (ref 150–400)
RBC: 4.28 MIL/uL (ref 4.22–5.81)
RBC: 4.17 MIL/uL — ABNORMAL LOW (ref 4.22–5.81)
RDW: 14.5 % (ref 11.5–15.5)
RDW: 14.4 % (ref 11.5–15.5)
WBC: 19.3 10*3/uL — AB (ref 4.0–10.5)
WBC: 20.5 10*3/uL — AB (ref 4.0–10.5)

## 2017-02-15 LAB — BASIC METABOLIC PANEL
ANION GAP: 11 (ref 5–15)
BUN: 14 mg/dL (ref 6–20)
CALCIUM: 8.3 mg/dL — AB (ref 8.9–10.3)
CHLORIDE: 98 mmol/L — AB (ref 101–111)
CO2: 24 mmol/L (ref 22–32)
CREATININE: 0.57 mg/dL — AB (ref 0.61–1.24)
GFR calc Af Amer: >60 mL/min (ref >60)
GFR calc non Af Amer: >60 mL/min (ref >60)
GLUCOSE: 114 mg/dL — AB (ref 65–99)
Potassium: 3.8 mmol/L (ref 3.5–5.1)
Sodium: 133 mmol/L — ABNORMAL LOW (ref 135–145)

## 2017-02-15 LAB — GLUCOSE, CAPILLARY
GLUCOSE-CAPILLARY: 114 mg/dL — AB (ref 65–99)
GLUCOSE-CAPILLARY: 144 mg/dL — AB (ref 65–99)
GLUCOSE-CAPILLARY: 154 mg/dL — AB (ref 65–99)
Glucose-Capillary: 106 mg/dL — ABNORMAL HIGH (ref 65–99)
Glucose-Capillary: 140 mg/dL — ABNORMAL HIGH (ref 65–99)
Glucose-Capillary: 142 mg/dL — ABNORMAL HIGH (ref 65–99)

## 2017-02-15 LAB — COOXEMETRY PANEL
CARBOXYHEMOGLOBIN: 2.2 % — AB (ref 0.5–1.5)
Methemoglobin: 0.6 % (ref 0.0–1.5)
O2 Saturation: 72 %
Total hemoglobin: 14.4 g/dL (ref 12.0–16.0)

## 2017-02-15 LAB — OCCULT BLOOD X 1 CARD TO LAB, STOOL: FECAL OCCULT BLD: POSITIVE — AB

## 2017-02-15 LAB — HEPARIN LEVEL (UNFRACTIONATED): Heparin Unfractionated: >2.2 IU/mL — ABNORMAL HIGH (ref 0.30–0.70)

## 2017-02-15 MED ORDER — PANTOPRAZOLE SODIUM 40 MG IV SOLR
40.0000 mg | Freq: Two times a day (BID) | INTRAVENOUS | Status: DC
  Administered 2017-02-15 – 2017-02-26 (×22): 40 mg via INTRAVENOUS
  Filled 2017-02-15 (×22): qty 40

## 2017-02-15 MED ORDER — APIXABAN 5 MG PO TABS
10.0000 mg | ORAL_TABLET | Freq: Two times a day (BID) | ORAL | Status: DC
  Administered 2017-02-15: 10 mg via ORAL
  Filled 2017-02-15: qty 2

## 2017-02-15 MED ORDER — IVABRADINE HCL 5 MG PO TABS
5.0000 mg | ORAL_TABLET | Freq: Two times a day (BID) | ORAL | Status: DC
  Administered 2017-02-15: 5 mg via ORAL
  Filled 2017-02-15 (×2): qty 1

## 2017-02-15 MED ORDER — HEPARIN (PORCINE) IN NACL 100-0.45 UNIT/ML-% IJ SOLN
1500.0000 [IU]/h | INTRAMUSCULAR | Status: AC
  Administered 2017-02-15: 1600 [IU]/h via INTRAVENOUS
  Administered 2017-02-16: 1500 [IU]/h via INTRAVENOUS
  Administered 2017-02-16: 1600 [IU]/h via INTRAVENOUS
  Administered 2017-02-17: 1500 [IU]/h via INTRAVENOUS
  Filled 2017-02-15 (×5): qty 250

## 2017-02-15 MED ORDER — APIXABAN 5 MG PO TABS: 5.0000 mg | ORAL_TABLET | Freq: Two times a day (BID) | ORAL | Status: DC

## 2017-02-15 NOTE — Progress Notes (Signed)
Pt HR 120-130s MD Karle Plumber notified, nursing will cont to monitor

## 2017-02-15 NOTE — Progress Notes (Signed)
Patient ID: Stephen Fleming, male   DOB: 03-27-1955, 62 y.o.   MRN: [id number]     Advanced Heart Failure Rounding Note  HF Cardiology: Dr Shirlee Latch  Subjective:    Continues on milrinone 0.25. CO-OX 72%. CVP remains low in 3-5 range.   Breathing improved, remains in sinus tachycardia.   Objective:   Weight Range: 169 lb 15.6 oz (77.1 kg) Body mass index is 24.39 kg/m.   Vital Signs:   Temp:  [97.6 F (36.4 C)-98.7 F (37.1 C)] 98 F (36.7 C) (01/26 0904) Pulse Rate:  [65-139] 126 (01/26 0800) Resp:  [15-35] 18 (01/26 0800) BP: (86-111)/(63-85) 102/74 (01/26 0800) SpO2:  [88 %-96 %] 89 % (01/26 0800) Weight:  [169 lb 15.6 oz (77.1 kg)] 169 lb 15.6 oz (77.1 kg) (01/26 0416) Last BM Date: 02/14/17  Weight change: Filed Weights   02/13/17 0426 02/14/17 0433 02/15/17 0416  Weight: 170 lb 6.7 oz (77.3 kg) 169 lb 12.1 oz (77 kg) 169 lb 15.6 oz (77.1 kg)    Intake/Output:   Intake/Output Summary (Last 24 hours) at 02/15/2017 0942 Last data filed at 02/15/2017 0600 Gross per 24 hour  Intake 468.3 ml  Output 625 ml  Net -156.7 ml      Physical Exam  CVP 3-5 General: NAD Neck: No JVD, no thyromegaly or thyroid nodule.  Lungs: Clear to auscultation bilaterally with normal respiratory effort. CV: Lateral PMI.  Heart tachy, regular S1/S2, no S3/S4, 1/6 HSM LLSB. 1+ edema to knees R>L.  Abdomen: Soft, nontender, no hepatosplenomegaly, no distention.  Skin: Intact without lesions or rashes.  Neurologic: Alert and oriented x 3.  Psych: Normal affect. Extremities: No clubbing or cyanosis.  HEENT: Normal.   Telemetry   Sinus tachy 120 (personally reviewed)   EKG    N/A   Labs    CBC Recent Labs    02/13/17 0215 02/14/17 0248  WBC 11.5* 12.2*  HGB 14.8 13.7  HCT 43.4 39.5  MCV 95.4 92.9  PLT 93* 107*   Basic Metabolic Panel Recent Labs    41/42/39 0215 02/14/17 0248  NA 134* 135  K 4.4 4.1  CL 103 101  CO2 21* 24  GLUCOSE 95 106*  BUN 28* 19  CREATININE  0.71 0.64  CALCIUM 8.6* 8.4*   Liver Function Tests No results for input(s): AST, ALT, ALKPHOS, BILITOT, PROT, ALBUMIN in the last 72 hours. No results for input(s): LIPASE, AMYLASE in the last 72 hours. Cardiac Enzymes No results for input(s): CKTOTAL, CKMB, CKMBINDEX, TROPONINI in the last 72 hours.  BNP: BNP (last 3 results) Recent Labs    12/04/16 0030  BNP 1,027.4*    ProBNP (last 3 results) No results for input(s): PROBNP in the last 8760 hours.   D-Dimer No results for input(s): DDIMER in the last 72 hours. Hemoglobin A1C No results for input(s): HGBA1C in the last 72 hours. Fasting Lipid Panel No results for input(s): CHOL, HDL, LDLCALC, TRIG, CHOLHDL, LDLDIRECT in the last 72 hours. Thyroid Function Tests No results for input(s): TSH, T4TOTAL, T3FREE, THYROIDAB in the last 72 hours.  Invalid input(s): FREET3  Other results:   Imaging    No results found.   Medications:     Scheduled Medications: . aspirin EC  81 mg Oral Daily  . atorvastatin  40 mg Oral q1800  . Chlorhexidine Gluconate Cloth  6 each Topical Q0600  . digoxin  0.125 mg Oral Daily  . feeding supplement (ENSURE ENLIVE)  237 mL Oral BID  BM  . folic acid  1 mg Oral Daily  . ivabradine  5 mg Oral BID WC  . mometasone-formoterol  2 puff Inhalation BID  . multivitamin with minerals  1 tablet Oral Daily  . mupirocin ointment  1 application Nasal BID  . QUEtiapine  25 mg Oral QHS  . sodium chloride flush  10-40 mL Intracatheter Q12H  . sodium chloride flush  3 mL Intravenous Q12H  . thiamine  100 mg Oral Daily    Infusions: . heparin 1,650 Units/hr (02/15/17 0902)  . milrinone 0.25 mcg/kg/min (02/14/17 2210)    PRN Medications: acetaminophen **OR** acetaminophen, albuterol, diazepam, ondansetron **OR** ondansetron (ZOFRAN) IV, sodium chloride flush, traMADol    Patient Profile   Stephen Fleming is a 13 year old with history of  Combined systolic/diastoic heart failure, CAD (cath in  11/2016 showing60% first diagonal, 95% ostial small OM1, 70% OM2, and 50% mid PDA--> medical management recommended, moderate Stephen, HTN, OSA intolerant CPAP.   Admitted with bilateral leg pain and dyspnea. Acute DVT/PE  Assessment/Plan   1. Bilateral DVTs with PE and pulmonary infarction: Did not have thrombolysis.  Thrombus in transit seen in RA on echo. Hemoptysis seems to have resolved.    - Transition from heparin gtt to apixaban PE/DVT treatment dose.   2. Acute on chronic systolic CHF: Nonischemic cardiomyopathy, out of proportion to degree of CAD.  May be related to prior heavy ETOH.  Echo (1/19) with moderate LV dilation, EF 10-15%, moderately decreased RV systolic function, D-shaped interventricular septum suggestive of RV pressure/volume overload, PASP 46 mmHg.  Low output HF by RHC in 11/18 (CI 1.65, mean PCWP 29).  Low co-ox initially at 55%, started on milrinone with co-ox up to 72%. Patient much warmer on exam. SBP 100s. Pending BMET.  CVP 3-5.   - Continue milrinone 0.25 mcg/kg/min today, will likely start titrating down in am.  - Continue digoxin.  - Follow CVP => no Lasix for now with CVP 3-5.   - Will add ivabradine 5 mg bid.  - Noted to have wide LBBB => CRT may be beneficial down the road.  - Cardiac MRI may be helpful when more stable.  3. CAD: Patient had extensive CAD on 11/18 cath but primarily nonobstructive.  Does not explain cardiomyopathy.  - Continue statin.   Out of bed.   [name] 02/15/2017 9:42 AM

## 2017-02-15 NOTE — Consult Note (Signed)
Gastroenterology Consult Note (covering for Drs. Nicholes Mango)   Referring Provider: Hazeline Junker, MD Primary Care Physician:  Louie Boston., MD Primary Gastroenterologist:  Gentry Fitz, covering for Drs. Mann & Elnoria Howard  Reason for Consultation:  Heme positive stool   HPI: Stephen Fleming is a 62 y.o. male who presented with bilateral leg pain, hemoptysis, severe DOE and found to have bilateral DVTs and a massive PE with right heart strain. He has resp failure from PE, pulmonary infarction and acute on chronic CHF. Placed on IV heparin and today had first dose of Eliquis. Heparin level noted to be elevated. He has had ongoing hemoptysis in the hospital. RN reported a small rust colored bowel movement today which tested heme positive. No melena, hematochezia, abdominal pain, N/V, diarrhea, dysphagia, reflux symptoms.    Past Medical History:  Diagnosis Date  . Arthritis    "probably in all my joints" (12/03/2016)  . Asthma   . CAD (coronary artery disease)    a. LHC 12/04/16: 30% RCA, 50% RPDA, 25% mid LAD, 95% 1st diag, 70% 2nd diag.  . CHF (congestive heart failure), NYHA class II, chronic, combined (HCC)    12/04/16: Echo EF 20-25%, Gr 3 DD, PA peak pressure 62 mmgHg, RHC: PCPW 29 mmHg  . Eczema   . Gout    "I take RX for it" (12/03/2016)  . Heart murmur   . Hypertension   . Mitral regurgitation    12/04/16: moderate by echo  . OSA (obstructive sleep apnea)    "never could tolerate the mask" (12/03/2016)  . Pulmonary hypertension (HCC)    12/04/16: RHC: Mod PHTN, PAP 37 mmHg    Past Surgical History:  Procedure Laterality Date  . APPENDECTOMY  ~  1967  . BACK SURGERY    . COLONOSCOPY  ~ 2008  . ESOPHAGOGASTRODUODENOSCOPY  ~ 2008  . INGUINAL HERNIA REPAIR Right ~ 1976  . LUMBAR DISC SURGERY  1991; 1993   "[name] did them both"  . RIGHT/LEFT HEART CATH AND CORONARY ANGIOGRAPHY N/A 12/04/2016   Procedure: RIGHT/LEFT HEART CATH AND CORONARY ANGIOGRAPHY;  Surgeon:  [place], Peter M, MD;  Location: [facility] INVASIVE CV LAB;  Service: Cardiovascular;  Laterality: N/A;    Prior to Admission medications   Medication Sig Start Date End Date Taking? Authorizing Provider  albuterol (PROVENTIL HFA;VENTOLIN HFA) 108 (90 BASE) MCG/ACT inhaler Inhale 2 puffs into the lungs every 6 (six) hours as needed for wheezing.   Yes [provider]  aspirin 81 MG tablet Take 81 mg by mouth daily.   Yes [provider]  benzonatate (TESSALON) 200 MG capsule Take 1 capsule (200 mg total) by mouth 3 (three) times daily as needed for cough. 01/22/17  Yes Strader, [place] M, PA-C  Fluticasone-Salmeterol (ADVAIR) 250-50 MCG/DOSE AEPB Inhale 1 puff into the lungs every 12 (twelve) hours.   Yes [provider]  furosemide (LASIX) 20 MG tablet Take 1 tablet (20 mg total) by mouth daily. 01/01/17 04/01/17 Yes Dyann Kief, PA-C  losartan (COZAAR) 50 MG tablet Take 50 mg by mouth daily.   Yes [provider]  metoprolol succinate (TOPROL XL) 25 MG 24 hr tablet Take 0.5 tablets (12.5 mg total) by mouth daily. 01/22/17  Yes Strader, [place] M, PA-C  polyethylene glycol powder (GLYCOLAX/MIRALAX) powder Take 17 g daily by mouth. 08/31/16  Yes [provider]  potassium chloride SA (K-DUR,KLOR-CON) 20 MEQ tablet Take 1 tablet (20 mEq total) daily by mouth. 12/07/16  Yes  Berton Bon, NP    Current Facility-Administered Medications  Medication Dose Route Frequency Provider Last Rate Last Dose  . acetaminophen (TYLENOL) tablet 650 mg  650 mg Oral Q6H PRN Haydee Salter, MD   650 mg at 02/14/17 0145   Or  . acetaminophen (TYLENOL) suppository 650 mg  650 mg Rectal Q6H PRN Haydee Salter, MD      . albuterol (PROVENTIL) (2.5 MG/3ML) 0.083% nebulizer solution 2.5 mg  2.5 mg Nebulization Q2H PRN Haydee Salter, MD      . aspirin EC tablet 81 mg  81 mg Oral Daily Tat, Onalee Hua, MD   81 mg at 02/15/17 0902  . atorvastatin (LIPITOR) tablet 40 mg  40 mg Oral  q1800 Laurey Morale, MD   40 mg at 02/15/17 1719  . Chlorhexidine Gluconate Cloth 2 % PADS 6 each  6 each Topical Q0600 Selmer Dominion B, NP   6 each at 02/15/17 1000  . diazepam (VALIUM) tablet 2 mg  2 mg Oral BID PRN John Giovanni, MD   2 mg at 02/15/17 0930  . digoxin (LANOXIN) tablet 0.125 mg  0.125 mg Oral Daily Clegg, Amy D, NP   0.125 mg at 02/15/17 0902  . feeding supplement (ENSURE ENLIVE) (ENSURE ENLIVE) liquid 237 mL  237 mL Oral BID BM Haydee Salter, MD   237 mL at 02/15/17 1559  . folic acid (FOLVITE) tablet 1 mg  1 mg Oral Daily Hammonds, Curt Jews, MD   1 mg at 02/15/17 0902  . heparin ADULT infusion 100 units/mL (25000 units/262mL sodium chloride 0.45%)  1,600 Units/hr Intravenous Continuous Rumbarger, Faye Ramsay, RPH      . ivabradine (CORLANOR) tablet 5 mg  5 mg Oral BID WC Laurey Morale, MD   5 mg at 02/15/17 1719  . milrinone (PRIMACOR) 20 MG/100 ML (0.2 mg/mL) infusion  0.25 mcg/kg/min Intravenous Continuous Clegg, Amy D, NP 5.8 mL/hr at 02/15/17 1701 0.25 mcg/kg/min at 02/15/17 1701  . mometasone-formoterol (DULERA) 200-5 MCG/ACT inhaler 2 puff  2 puff Inhalation BID Haydee Salter, MD   2 puff at 02/15/17 [phone]  . multivitamin with minerals tablet 1 tablet  1 tablet Oral Daily Hammonds, Curt Jews, MD   1 tablet at 02/15/17 0902  . mupirocin ointment (BACTROBAN) 2 % 1 application  1 application Nasal BID Selmer Dominion B, NP   1 application at 02/15/17 [phone]  . ondansetron (ZOFRAN) tablet 4 mg  4 mg Oral Q6H PRN Haydee Salter, MD       Or  . ondansetron [facility]) injection 4 mg  4 mg Intravenous Q6H PRN Haydee Salter, MD      . pantoprazole (PROTONIX) injection 40 mg  40 mg Intravenous Q12H Tyrone Nine, MD   40 mg at 02/15/17 1719  . QUEtiapine (SEROQUEL) tablet 25 mg  25 mg Oral QHS Hammonds, Curt Jews, MD   25 mg at 02/14/17 2131  . sodium chloride flush (NS) 0.9 % injection 10-40 mL  10-40 mL Intracatheter Q12H Hammonds, Curt Jews, MD   20 mL at  02/15/17 0922  . sodium chloride flush (NS) 0.9 % injection 10-40 mL  10-40 mL Intracatheter PRN Hammonds, Curt Jews, MD      . sodium chloride flush (NS) 0.9 % injection 3 mL  3 mL Intravenous Q12H Haydee Salter, MD   3 mL at 02/12/17 2111  . thiamine (VITAMIN B-1) tablet 100 mg  100 mg Oral Daily Hammonds, Curt Jews,  MD   100 mg at 02/15/17 0902  . traMADol (ULTRAM) tablet 50 mg  50 mg Oral Q6H PRN Tyrone Nine, MD   50 mg at 02/15/17 1719    Allergies as of 02/10/2017  . (No Known Allergies)    Family History  Problem Relation Age of Onset  . Heart attack Mother   . CAD Father   . Heart attack Brother     Social History   Socioeconomic History  . Marital status: Married    Spouse name: Not on file  . Number of children: Not on file  . Years of education: Not on file  . Highest education level: Not on file  Social Needs  . Financial resource strain: Not on file  . Food insecurity - worry: Not on file  . Food insecurity - inability: Not on file  . Transportation needs - medical: Not on file  . Transportation needs - non-medical: Not on file  Occupational History  . Not on file  Tobacco Use  . Smoking status: Never Smoker  . Smokeless tobacco: Never Used  Substance and Sexual Activity  . Alcohol use: Yes    Alcohol/week: 12.0 oz    Types: 20 Cans of beer per week  . Drug use: No  . Sexual activity: Not Currently  Other Topics Concern  . Not on file  Social History Narrative  . Not on file    Review of Systems: Gen: Denies any fever, chills, sweats, anorexia, fatigue, weakness, malaise, weight loss, and sleep disorder CV: Denies chest pain, angina, palpitations, syncope, orthopnea, PND, peripheral edema, and claudication. Resp: Denies wheezing. GI: Denies vomiting blood, jaundice, and fecal incontinence.   Denies dysphagia or odynophagia. GU : Denies urinary burning, blood in urine, urinary frequency, urinary hesitancy, nocturnal urination, and urinary  incontinence. MS: Denies joint pain, limitation of movement, and swelling, stiffness, low back pain, extremity pain. Denies muscle weakness, cramps, atrophy.  Derm: Denies rash, itching, dry skin, hives, moles, warts, or unhealing ulcers.  Psych: Denies depression, anxiety, memory loss, suicidal ideation, hallucinations, paranoia, and confusion. Heme: Denies bruising, bleeding, and enlarged lymph nodes. Neuro:  Denies any headaches, dizziness, paresthesias. Endo:  Denies any problems with DM, thyroid, adrenal function.  Physical Exam: Vital signs in last 24 hours: Temp:  [97.6 F (36.4 C)-98.7 F (37.1 C)] 98.2 F (36.8 C) (01/26 1704) Pulse Rate:  [65-139] 129 (01/26 1600) Resp:  [15-31] 23 (01/26 1600) BP: (86-111)/(63-85) 91/76 (01/26 1600) SpO2:  [88 %-96 %] 94 % (01/26 1600) Weight:  [169 lb 15.6 oz (77.1 kg)] 169 lb 15.6 oz (77.1 kg) (01/26 0416) Last BM Date: 02/14/17  General:  Alert, well-developed, well-nourished, ill appearing, mild resp distress Head:  Normocephalic and atraumatic. Eyes:  Sclera clear, no icterus. Conjunctiva pink. Ears:  Normal auditory acuity. Nose:  No deformity, discharge, or lesions. Mouth:  No deformity or lesions. Oropharynx pink & moist. Neck:  Supple; no masses or thyromegaly. Chest:  Clear throughout to auscultation. No wheezes, crackles, or rhonchi. No acute distress. Heart:  Regular rate and rhythm; no murmurs, clicks, rubs, or gallops. Abdomen:  Soft, nontender and nondistended. No masses, hepatosplenomegaly or hernias noted. Normal bowel sounds, without guarding, and without rebound.   Rectal:  Deferred until time of colonoscopy.   Msk:  Symmetrical without gross deformities. Normal posture. Pulses:  Normal pulses noted. Extremities:  Without clubbing or edema. Neurologic:  Alert and  oriented x4;  grossly normal neurologically. Skin:  Intact without significant lesions  or rashes. Cervical Nodes:  No significant cervical  adenopathy. Psych:  Alert and cooperative. Normal mood and affect.  Intake/Output from previous day: 01/25 0701 - 01/26 0700 In: 529.4 [I.V.:529.4] Out: 625 [Urine:625] Intake/Output this shift: Total I/O In: 58 [I.V.:58] Out: -   Lab Results: Recent Labs    02/13/17 0215 02/14/17 0248 02/15/17 0911  WBC 11.5* 12.2* 19.3*  HGB 14.8 13.7 13.8  HCT 43.4 39.5 40.2  PLT 93* 107* 166   BMET Recent Labs    02/13/17 0215 02/14/17 0248 02/15/17 0911  NA 134* 135 133*  K 4.4 4.1 3.8  CL 103 101 98*  CO2 21* 24 24  GLUCOSE 95 106* 114*  BUN 28* 19 14  CREATININE 0.71 0.64 0.57*  CALCIUM 8.6* 8.4* 8.3*     Impression/ Recommendations: 1. Heme positive stool without overt GI bleeding, without anemia in pt on IV heparin and now Eliquis. Occult GI blood loss vs swallowed blood from hemoptysis. His Hb is stable at 13.8 today.  Given massive PE, pulmonary infarction with right heart strain and CHF the risk of stopping IV heparin outweights benefits to helping minimal blood loss. DC Eliquis. Maintain IV heparin in therapeutic range. If overt, active LGI bleeding occurs proceed with nuclear tagger RBC scan. Trend CBC and heparin levels as appropriate. IV PPI for stress ulcer prophylaxis. 5 days ago INR was 1.43, recheck. Recommendations discussed with Dr. Jarvis Newcomer.    LOS: 4 days   Judie Petit T. Russella Dar MD 02/15/2017, 5:41 PM [phone] Mon-Fri 8a-5p  [phone] after 5p, weekends, holidays

## 2017-02-15 NOTE — Progress Notes (Addendum)
ANTICOAGULATION CONSULT NOTE Pharmacy Consult for Heparin>>apixaban Indication: pulmonary embolus  No Known Allergies  Patient Measurements: Height: 5\' 10"  (177.8 cm) Weight: 169 lb 15.6 oz (77.1 kg) IBW/kg (Calculated) : 73 HEPARIN DW (KG): 77.2  Vital Signs: Temp: 98 F (36.7 C) (01/26 0904) Temp Source: Oral (01/26 0904) BP: 102/74 (01/26 0800) Pulse Rate: 126 (01/26 0800)  Labs: Recent Labs    02/13/17 0215  02/13/17 2007 02/14/17 0248 02/14/17 0834 02/15/17 0911  HGB 14.8  --   --  13.7  --  13.8  HCT 43.4  --   --  39.5  --  40.2  PLT 93*  --   --  107*  --  166  HEPARINUNFRC 0.29*   < > 0.20* 0.44 0.54  --   CREATININE 0.71  --   --  0.64  --  0.57*   < > = values in this interval not displayed.    Estimated Creatinine Clearance: 98.9 mL/min (A) (by C-G formula based on SCr of 0.57 mg/dL (L)).  Assessment: 70 yoM presents with PE/DVT initially started on IV heparin. Now transitioning to PO apixaban. Hemoptysis is resolved. Pt with good renal function and CBC has been stable.   Goal of Therapy:  Monitor platelets by anticoagulation protocol: Yes   Plan:  Stop IV heparin at the time of apixaban administration Apixaban 10mg  PO BID x 7 days then 5mg  PO BID thereafter F/u S&S of bleeding, renal fxn  Lysle Pearl, PharmD, BCPS Phone #: [phone] until 3pm All other times, call Main Pharmacy x [phone] 02/15/2017 10:31 AM  Addendum: Changing back to IV heparin due to ongoing bleeding. Pt did receive 1x dose of apixaban this AM at ~11pm. Will start heparin gtt at 2300 tonight when next dose of apixaban would have been due. Will dose based on aPTT for now as apixaban will influence anti-Xa levels.   Lysle Pearl, PharmD, BCPS 02/15/2017 5:09 PM

## 2017-02-15 NOTE — Plan of Care (Signed)
  Progressing Pain Managment: General experience of comfort will improve 02/15/2017 0613 - Progressing by Oree Mirelez A, RN Note Patient getting adequate pain relief from tramadol. Able to rest better during day and night shift.

## 2017-02-15 NOTE — Progress Notes (Addendum)
PROGRESS NOTE  Stephen Fleming  ZOX:[id number] DOB: 10-05-1955 DOA: 02/10/2017 PCP: Louie Boston., MD   Brief Narrative: Stephen Fleming is a 62 y.o. male with a history of chronic HFrEF, alcohol abuse (stopped Nov 2018), CAD [facility] Nov 2018 showed diffuse stenoses managed medically), HTN, OSA intolerant of CPAP, and gout who presented to [facility] 1/21 with bilateral leg pain, hemoptysis, severe dyspnea on exertion, found to have bilateral LE DVT's and acute submassive PE on CTA with RV/LV of 1, right heart strain noted on TTE. He was placed on heparin, somewhat hypotensive and was transferred to [facility] for consideration of thrombolytics. No thrombolytics were indicated. Heart failure team was consulted for acute systolic LV and right heart failure, echo showing started on milrinone. CVP remains low, so holding on diuresis.   Assessment & Plan: Active Problems:   Dyspnea   Chronic combined systolic and diastolic CHF (congestive heart failure) (HCC)   DVT of lower extremity, bilateral (HCC)   Hemoptysis   Acute pulmonary embolism (HCC)  Acute hypoxic respiratory failure: Due to PE, acute CHF.  - Supplemental oxygen prn, though pt frequently declining this. - Treat conditions as below.   Acute submassive PE with right heart strain and bilateral acute DVTs: Suspect LLL atelectasis due to pulmonary infarct.  - Had rusty stool this afternoon and reports ongoing hemoptysis to me, so would prefer to continue heparin, delay transition to DOAC for now.  - Monitor CBC  GI bleeding: Rusty stool reported this PM, +FOBT. Very concerning, though with his current situation, feel we need to continue anticoagulation as pt not currently anemic. No large volume bleeding noted.  - Check CBC q6h - Empiric PPI - Please order NM tagged RBC scan if bleeding continues. - GI, Dr. Russella Dar consulted.  - D/w pharmacy, will restart heparin this evening unless GI bleeding continues/worsens. Pt will likely need IVC filter.   Acute  on chronic systolic CHF: EF worsened 20-25% -> 10-15%. Possibly related to chronic EtOH in addition to medically managed CAD.  - Appreciate HF team involvement. On milrinone, anticipate titration down if improving by 1/27. Started digoxin, ivabradine.  - CVP stable, coox improved.   CAD w/LBBB: LHC Nov 2018 with  - Continue statin, anticoagulation - Per HF, CRT pending clinical course  Right atrial thrombus:  - Continue heparin.   EtOH abuse: Not recently. CIWA scores very low.  - Continue supplements, DC CIWA  DVT prophylaxis: Eliquis Code Status: Full Family Communication: None at bedside Disposition Plan: Uncertain. Not stable for therapy currently.   Consultants:   PCCM primary through 1/24  HF team  Procedures:  Echo 02/11/2017 Left ventricle: The cavity size was moderately dilated. Wall thickness was normal. The estimated ejection fraction was in the range of 10% to 15%. Diffuse hypokinesis. The study is not technically sufficient to allow evaluation of LV diastolic function. - Regional wall motion abnormality: Akinesis of the basal-mid anteroseptal myocardium. - Aortic valve: There was mild regurgitation. Valve area (VTI): 3.88 cm^2. Valve area (Vmax): 3.22 cm^2. Valve area (Vmean): 3.23 cm^2. - Mitral valve: Mildly calcified annulus. There was mild regurgitation. - Left atrium: The atrium was severely dilated. - Right ventricle: The ventricular septum is flattend in systole and diastole consistent with RV pressure and volume overload. Systolic function was moderately reduced. TAPSE: 12.6 mm . - Right atrium: There is a semimobile linear structure in the right atrium that likely represents a thrombus. The atrium was moderately dilated. - Atrial septum: No defect or patent foramen  ovale was identified. - Tricuspid valve: There was moderate regurgitation. - Pulmonary arteries: Systolic pressure was moderately increased. PA peak pressure:  46 mm Hg (S). - Inferior vena cava: The vessel was dilated. The respirophasic diameter changes were blunted (<50%), consistent with elevated central venous pressure.  ECHO 11/2016 EF 20-25%  RV mild/mod dilated. RV normal.     LHC/RHC 12/04/2016  Prox RCA to Mid RCA lesion is 30% stenosed.  RPDA lesion is 50% stenosed.  Mid LAD lesion is 25% stenosed.  Prox LAD lesion is 20% stenosed.  Ost 1st Diag lesion is 60% stenosed.  Ost 1st Mrg lesion is 95% stenosed.  Ost 2nd Mrg lesion is 70% stenosed.  Hemodynamic findings consistent with moderate pulmonary hypertension. 1. CAD with 60% first diagonal, 95% ostial small OM1, 70% OM2, and 50% mid PDA 2. Moderate pulmonary HTN with mean PAP 37 mm Hg 3. Moderately elevated LV filling pressures with PCWP of 29 mm Hg 4. Reduced CO with index 1.65.   Antimicrobials:  None  Subjective: Feels short of breath, but declining nasal cannula oxygen. L > R chest pain with deep inspiration or coughing. Still with intermittent hemoptysis.   Objective: Vitals:   02/15/17 0600 02/15/17 0800 02/15/17 0904 02/15/17 1243  BP: 94/71 102/74    Pulse: (!) 126 (!) 126    Resp: 15 18    Temp:   98 F (36.7 C) 98 F (36.7 C)  TempSrc:   Oral Oral  SpO2: 93% (!) 89%    Weight:      Height:        Intake/Output Summary (Last 24 hours) at 02/15/2017 1544 Last data filed at 02/15/2017 0600 Gross per 24 hour  Intake 334.5 ml  Output 225 ml  Net 109.5 ml   Filed Weights   02/13/17 0426 02/14/17 0433 02/15/17 0416  Weight: 77.3 kg (170 lb 6.7 oz) 77 kg (169 lb 12.1 oz) 77.1 kg (169 lb 15.6 oz)    Gen: 62 y.o. male in no distress  Pulm: Accessory muscle use noted on room air, 88-92% SpO2. No wheezes.  CV: Regular tachycardia. No murmur, rub, or gallop. No JVD. GI: Abdomen soft, non-tender, non-distended, with normoactive bowel sounds. No organomegaly or masses felt. Ext: Warm, bilateral LE edema. Skin: RUE PICC c/d/i Neuro: Alert  and oriented. No focal neurological deficits. Psych: Judgement and insight appear normal. Mood & affect appropriate.   Data Reviewed: I have personally reviewed following labs and imaging studies  CBC: Recent Labs  Lab 02/10/17 1958 02/11/17 0557 02/12/17 0327 02/13/17 0215 02/14/17 0248 02/15/17 0911  WBC 10.7* 12.4* 17.5* 11.5* 12.2* 19.3*  NEUTROABS 7.6  --   --   --   --   --   HGB 18.1* 17.9* 17.6*  17.7* 14.8 13.7 13.8  HCT 54.4* 53.0* 51.5  51.9 43.4 39.5 40.2  MCV 94.8 94.6 96.3 95.4 92.9 93.9  PLT PLATELET CLUMPS NOTED ON SMEAR, COUNT APPEARS ADEQUATE 104* 130* 93* 107* 166   Basic Metabolic Panel: Recent Labs  Lab 02/11/17 0557 02/12/17 0327 02/13/17 0215 02/14/17 0248 02/15/17 0911  NA 134* 134* 134* 135 133*  K 5.3* 5.0 4.4 4.1 3.8  CL 98* 100* 103 101 98*  CO2 21* 22 21* 24 24  GLUCOSE 94 123* 95 106* 114*  BUN 34* 31* 28* 19 14  CREATININE 1.05 1.10 0.71 0.64 0.57*  CALCIUM 9.2 9.1 8.6* 8.4* 8.3*  MG  --  2.1  --   --   --  PHOS  --  2.9  --   --   --    GFR: Estimated Creatinine Clearance: 98.9 mL/min (A) (by C-G formula based on SCr of 0.57 mg/dL (L)). Liver Function Tests: Recent Labs  Lab 02/10/17 1704 02/12/17 0327  AST 59* 40  ALT 45 35  ALKPHOS 70 63  BILITOT 3.3* 2.8*  PROT 6.8 6.2*  ALBUMIN 3.8 3.3*  3.3*   No results for input(s): LIPASE, AMYLASE in the last 168 hours. No results for input(s): AMMONIA in the last 168 hours. Coagulation Profile: Recent Labs  Lab 02/10/17 1852  INR 1.43   Cardiac Enzymes: Recent Labs  Lab 02/10/17 1704 02/12/17 0327  TROPONINI <0.03 <0.03   BNP (last 3 results) No results for input(s): PROBNP in the last 8760 hours. HbA1C: No results for input(s): HGBA1C in the last 72 hours. CBG: Recent Labs  Lab 02/11/17 2032 02/15/17 0030 02/15/17 0904 02/15/17 1246  GLUCAP 119* 154* 114* 140*   Lipid Profile: No results for input(s): CHOL, HDL, LDLCALC, TRIG, CHOLHDL, LDLDIRECT in the  last 72 hours. Thyroid Function Tests: No results for input(s): TSH, T4TOTAL, FREET4, T3FREE, THYROIDAB in the last 72 hours. Anemia Panel: No results for input(s): VITAMINB12, FOLATE, FERRITIN, TIBC, IRON, RETICCTPCT in the last 72 hours. Urine analysis:    Component Value Date/Time   COLORURINE YELLOW 06/18/2015 1720   APPEARANCEUR CLEAR 06/18/2015 1720   LABSPEC 1.015 06/18/2015 1720   PHURINE 6.0 06/18/2015 1720   GLUCOSEU NEGATIVE 06/18/2015 1720   HGBUR NEGATIVE 06/18/2015 1720   BILIRUBINUR NEGATIVE 06/18/2015 1720   KETONESUR NEGATIVE 06/18/2015 1720   PROTEINUR NEGATIVE 06/18/2015 1720   NITRITE NEGATIVE 06/18/2015 1720   LEUKOCYTESUR NEGATIVE 06/18/2015 1720   Recent Results (from the past 240 hour(s))  MRSA PCR Screening     Status: Abnormal   Collection Time: 02/11/17  7:59 PM  Result Value Ref Range Status   MRSA by PCR POSITIVE (A) NEGATIVE Final    Comment:        The GeneXpert MRSA Assay (FDA approved for NASAL specimens only), is one component of a comprehensive MRSA colonization surveillance program. It is not intended to diagnose MRSA infection nor to guide or monitor treatment for MRSA infections. RESULT CALLED TO, READ BACK BY AND VERIFIED WITH: H DADAMO RN 02/12/17 119 JDW       Radiology Studies: No results found.  Scheduled Meds: . apixaban  10 mg Oral BID   Followed by  . [START ON 02/22/2017] apixaban  5 mg Oral BID  . aspirin EC  81 mg Oral Daily  . atorvastatin  40 mg Oral q1800  . Chlorhexidine Gluconate Cloth  6 each Topical Q0600  . digoxin  0.125 mg Oral Daily  . feeding supplement (ENSURE ENLIVE)  237 mL Oral BID BM  . folic acid  1 mg Oral Daily  . ivabradine  5 mg Oral BID WC  . mometasone-formoterol  2 puff Inhalation BID  . multivitamin with minerals  1 tablet Oral Daily  . mupirocin ointment  1 application Nasal BID  . QUEtiapine  25 mg Oral QHS  . sodium chloride flush  10-40 mL Intracatheter Q12H  . sodium chloride flush   3 mL Intravenous Q12H  . thiamine  100 mg Oral Daily   Continuous Infusions: . milrinone 0.25 mcg/kg/min (02/14/17 2210)     LOS: 4 days   Time spent: 35 minutes.  Hazeline Junker, MD [facility] Pager [phone]  If 7PM-7AM, please contact  night-coverage [email or web address] Password [facility] 02/15/2017, 3:44 PM

## 2017-02-16 DIAGNOSIS — R195 Other fecal abnormalities: Secondary | ICD-10-CM

## 2017-02-16 DIAGNOSIS — Z7901 Long term (current) use of anticoagulants: Secondary | ICD-10-CM

## 2017-02-16 LAB — CBC
HCT: 39.1 % (ref 39.0–52.0)
HEMATOCRIT: 39.7 % (ref 39.0–52.0)
HEMOGLOBIN: 13.8 g/dL (ref 13.0–17.0)
HEMOGLOBIN: 13.6 g/dL (ref 13.0–17.0)
MCH: 32.6 pg (ref 26.0–34.0)
MCH: 32.2 pg (ref 26.0–34.0)
MCHC: 35.3 g/dL (ref 30.0–36.0)
MCHC: 34.3 g/dL (ref 30.0–36.0)
MCV: 92.4 fL (ref 78.0–100.0)
MCV: 93.9 fL (ref 78.0–100.0)
Platelets: 161 10*3/uL (ref 150–400)
Platelets: 189 10*3/uL (ref 150–400)
RBC: 4.23 MIL/uL (ref 4.22–5.81)
RBC: 4.23 MIL/uL (ref 4.22–5.81)
RDW: 14.5 % (ref 11.5–15.5)
RDW: 14.6 % (ref 11.5–15.5)
WBC: 19.1 10*3/uL — ABNORMAL HIGH (ref 4.0–10.5)
WBC: 21.7 10*3/uL — ABNORMAL HIGH (ref 4.0–10.5)

## 2017-02-16 LAB — GLUCOSE, CAPILLARY
GLUCOSE-CAPILLARY: 120 mg/dL — AB (ref 65–99)
GLUCOSE-CAPILLARY: 99 mg/dL (ref 65–99)
Glucose-Capillary: 125 mg/dL — ABNORMAL HIGH (ref 65–99)
Glucose-Capillary: 117 mg/dL — ABNORMAL HIGH (ref 65–99)
Glucose-Capillary: 135 mg/dL — ABNORMAL HIGH (ref 65–99)
Glucose-Capillary: 135 mg/dL — ABNORMAL HIGH (ref 65–99)

## 2017-02-16 LAB — BASIC METABOLIC PANEL
Anion gap: 8 (ref 5–15)
BUN: 17 mg/dL (ref 6–20)
CO2: 26 mmol/L (ref 22–32)
Calcium: 8.5 mg/dL — ABNORMAL LOW (ref 8.9–10.3)
Chloride: 98 mmol/L — ABNORMAL LOW (ref 101–111)
Creatinine, Ser: 0.6 mg/dL — ABNORMAL LOW (ref 0.61–1.24)
GFR calc Af Amer: >60 mL/min (ref >60)
GFR calc non Af Amer: >60 mL/min (ref >60)
Glucose, Bld: 133 mg/dL — ABNORMAL HIGH (ref 65–99)
Potassium: 3.9 mmol/L (ref 3.5–5.1)
SODIUM: 132 mmol/L — AB (ref 135–145)

## 2017-02-16 LAB — COOXEMETRY PANEL
Carboxyhemoglobin: 1.3 % (ref 0.5–1.5)
Carboxyhemoglobin: 1.4 % (ref 0.5–1.5)
METHEMOGLOBIN: 0.8 % (ref 0.0–1.5)
Methemoglobin: 0.9 % (ref 0.0–1.5)
O2 SAT: 50 %
O2 Saturation: 51.1 %
TOTAL HEMOGLOBIN: 14.8 g/dL (ref 12.0–16.0)
TOTAL HEMOGLOBIN: 13.8 g/dL (ref 12.0–16.0)

## 2017-02-16 LAB — APTT
APTT: 78 s — AB (ref 24–36)
aPTT: 111 seconds — ABNORMAL HIGH (ref 24–36)
aPTT: >200 seconds (ref 24–36)

## 2017-02-16 LAB — PROTIME-INR
INR: 1.81
PROTHROMBIN TIME: 20.8 s — AB (ref 11.4–15.2)

## 2017-02-16 MED ORDER — POTASSIUM CHLORIDE CRYS ER 20 MEQ PO TBCR
20.0000 meq | EXTENDED_RELEASE_TABLET | Freq: Once | ORAL | Status: AC
  Administered 2017-02-16: 20 meq via ORAL
  Filled 2017-02-16: qty 1

## 2017-02-16 MED ORDER — AMIODARONE LOAD VIA INFUSION
150.0000 mg | Freq: Once | INTRAVENOUS | Status: AC
  Administered 2017-02-16: 150 mg via INTRAVENOUS
  Filled 2017-02-16: qty 83.34

## 2017-02-16 MED ORDER — AMIODARONE HCL IN DEXTROSE 360-4.14 MG/200ML-% IV SOLN
30.0000 mg/h | INTRAVENOUS | Status: DC
Start: 2017-02-16 — End: 2017-02-18
  Administered 2017-02-16 – 2017-02-18 (×5): 30 mg/h via INTRAVENOUS
  Filled 2017-02-16 (×8): qty 200

## 2017-02-16 MED ORDER — FUROSEMIDE 40 MG PO TABS
40.0000 mg | ORAL_TABLET | Freq: Every day | ORAL | Status: DC
Start: 2017-02-16 — End: 2017-02-17
  Administered 2017-02-16: 40 mg via ORAL
  Filled 2017-02-16 (×2): qty 1

## 2017-02-16 MED ORDER — AMIODARONE HCL IN DEXTROSE 360-4.14 MG/200ML-% IV SOLN
60.0000 mg/h | INTRAVENOUS | Status: AC
  Administered 2017-02-16 (×2): 60 mg/h via INTRAVENOUS
  Filled 2017-02-16: qty 200

## 2017-02-16 NOTE — Progress Notes (Signed)
ANTICOAGULATION CONSULT NOTE Pharmacy Consult for Heparin Indication: pulmonary embolus  No Known Allergies  Patient Measurements: Height: 5\' 10"  (177.8 cm) Weight: 173 lb 8 oz (78.7 kg) IBW/kg (Calculated) : 73 HEPARIN DW (KG): 77.2  Vital Signs: Temp: 97.8 F (36.6 C) (01/27 1922) Temp Source: Oral (01/27 1922) BP: 102/74 (01/27 2100) Pulse Rate: 71 (01/27 2100)  Labs: Recent Labs    02/14/17 0248 02/14/17 0834 02/15/17 0500 02/15/17 0911 02/15/17 1757 02/16/17 0250 02/16/17 0731 02/16/17 0732 02/16/17 1855 02/16/17 2100  HGB 13.7  --   --  13.8 13.3 13.8  --  13.6  --   --   HCT 39.5  --   --  40.2 39.0 39.1  --  39.7  --   --   PLT 107*  --   --  166 175 161  --  189  --   --   APTT  --   --   --   --   --   --  111*  --  >200* 78*  LABPROT  --   --   --   --   --   --  20.8*  --   --   --   INR  --   --   --   --   --   --  1.81  --   --   --   HEPARINUNFRC 0.44 0.54 >2.20*  --   --   --   --   --   --   --   CREATININE 0.64  --   --  0.57*  --  0.60*  --   --   --   --     Estimated Creatinine Clearance: 98.9 mL/min (A) (by C-G formula based on SCr of 0.6 mg/dL (L)).  Assessment: 50 yoM presents with PE/DVT initially started on IV heparin. Briefly transitioned to apixaban but now back on IV heparin due to concern for GIB.  aPTT this evening is therapeutic after a rate reduction earlier today (aPTT 78 << 111, goal of 66-102 seconds).    Goal of Therapy:  Heparin level goal 0.3-0.7 APTT goal 66-102 Monitor platelets by anticoagulation protocol: Yes   Plan:  Continue heparin gtt at 1500 units/hr Check an 6 hr aPTT with AM labs to confirm Daily aPTT, heparin level and CBC  Thank you for allowing pharmacy to be a part of this patient's care.  Georgina Pillion, PharmD, BCPS Clinical Pharmacist Pager: [phone] 02/16/2017 10:00 PM

## 2017-02-16 NOTE — Progress Notes (Signed)
Patient ID: Stephen Fleming, male   DOB: 1955-06-30, 62 y.o.   MRN: [id number]     Advanced Heart Failure Rounding Note  HF Cardiology: Dr Shirlee Latch  Subjective:    Continues on milrinone 0.25. CO-OX 72%=>50% early am. CVP 9 today.   Possible rapid atrial fibrillation overnight, amiodarone gtt started and back in NSR this morning in 90s.   Breathing overall improved.   Rust-colored stool yesterday, heparin gtt resumed rather than Eliquis.  Hemoglobin stable. Still with some hemoptysis.    Objective:   Weight Range: 173 lb 8 oz (78.7 kg) Body mass index is 24.89 kg/m.   Vital Signs:   Temp:  [97.6 F (36.4 C)-98.2 F (36.8 C)] 97.6 F (36.4 C) (01/27 0045) Pulse Rate:  [65-138] 97 (01/27 0800) Resp:  [13-32] 13 (01/27 0800) BP: (88-107)/(63-80) 94/77 (01/27 0800) SpO2:  [89 %-96 %] 94 % (01/27 0800) Weight:  [173 lb 8 oz (78.7 kg)] 173 lb 8 oz (78.7 kg) (01/27 0743) Last BM Date: 02/15/17  Weight change: Filed Weights   02/14/17 0433 02/15/17 0416 02/16/17 0743  Weight: 169 lb 12.1 oz (77 kg) 169 lb 15.6 oz (77.1 kg) 173 lb 8 oz (78.7 kg)    Intake/Output:   Intake/Output Summary (Last 24 hours) at 02/16/2017 0919 Last data filed at 02/16/2017 0400 Gross per 24 hour  Intake 436.35 ml  Output 775 ml  Net -338.65 ml      Physical Exam  CVP 9 General: NAD Neck: JVP 8 cm, no thyromegaly or thyroid nodule.  Lungs: Mild crackles at bases. CV: Lateral PMI.  Heart regular S1/S2, no S3/S4, no murmur.  No peripheral edema.   Abdomen: Soft, nontender, no hepatosplenomegaly, no distention.  Skin: Intact without lesions or rashes.  Neurologic: Alert and oriented x 3.  Psych: Normal affect. Extremities: No clubbing or cyanosis.  HEENT: Normal.   Telemetry   NSR 90s, suspected run of atrial fibrillation last night (personally reviewed)   EKG    N/A   Labs    CBC Recent Labs    02/16/17 0250 02/16/17 0732  WBC 19.1* 21.7*  HGB 13.8 13.6  HCT 39.1 39.7  MCV 92.4  93.9  PLT 161 189   Basic Metabolic Panel Recent Labs    95/32/02 0911 02/16/17 0250  NA 133* 132*  K 3.8 3.9  CL 98* 98*  CO2 24 26  GLUCOSE 114* 133*  BUN 14 17  CREATININE 0.57* 0.60*  CALCIUM 8.3* 8.5*   Liver Function Tests No results for input(s): AST, ALT, ALKPHOS, BILITOT, PROT, ALBUMIN in the last 72 hours. No results for input(s): LIPASE, AMYLASE in the last 72 hours. Cardiac Enzymes No results for input(s): CKTOTAL, CKMB, CKMBINDEX, TROPONINI in the last 72 hours.  BNP: BNP (last 3 results) Recent Labs    12/04/16 0030  BNP 1,027.4*    ProBNP (last 3 results) No results for input(s): PROBNP in the last 8760 hours.   D-Dimer No results for input(s): DDIMER in the last 72 hours. Hemoglobin A1C No results for input(s): HGBA1C in the last 72 hours. Fasting Lipid Panel No results for input(s): CHOL, HDL, LDLCALC, TRIG, CHOLHDL, LDLDIRECT in the last 72 hours. Thyroid Function Tests No results for input(s): TSH, T4TOTAL, T3FREE, THYROIDAB in the last 72 hours.  Invalid input(s): FREET3  Other results:   Imaging    No results found.   Medications:     Scheduled Medications: . aspirin EC  81 mg Oral Daily  . atorvastatin  40 mg Oral q1800  . Chlorhexidine Gluconate Cloth  6 each Topical Q0600  . digoxin  0.125 mg Oral Daily  . feeding supplement (ENSURE ENLIVE)  237 mL Oral BID BM  . folic acid  1 mg Oral Daily  . furosemide  40 mg Oral Daily  . mometasone-formoterol  2 puff Inhalation BID  . multivitamin with minerals  1 tablet Oral Daily  . mupirocin ointment  1 application Nasal BID  . pantoprazole (PROTONIX) IV  40 mg Intravenous Q12H  . potassium chloride  20 mEq Oral Once  . QUEtiapine  25 mg Oral QHS  . sodium chloride flush  10-40 mL Intracatheter Q12H  . sodium chloride flush  3 mL Intravenous Q12H  . thiamine  100 mg Oral Daily    Infusions: . amiodarone 30 mg/hr (02/16/17 0759)  . heparin 1,600 Units/hr (02/16/17 0714)  .  milrinone 0.25 mcg/kg/min (02/15/17 1701)    PRN Medications: acetaminophen **OR** acetaminophen, albuterol, diazepam, ondansetron **OR** ondansetron (ZOFRAN) IV, sodium chloride flush, traMADol    Patient Profile   Stephen Fleming is a 69 year old with history of  Combined systolic/diastoic heart failure, CAD (cath in 11/2016 showing60% first diagonal, 95% ostial small OM1, 70% OM2, and 50% mid PDA--> medical management recommended, moderate Stephen, HTN, OSA intolerant CPAP.   Admitted with bilateral leg pain and dyspnea. Acute DVT/PE  Assessment/Plan   1. Bilateral DVTs with PE and pulmonary infarction: Did not have thrombolysis.  Thrombus in transit seen in RA on echo. Still some hemoptysis but improved.   - Continue heparin gtt rather than DOAC with concern for GI bleeding.  2. Acute on chronic systolic CHF: Nonischemic cardiomyopathy, out of proportion to degree of CAD.  May be related to prior heavy ETOH.  Echo (1/19) with moderate LV dilation, EF 10-15%, moderately decreased RV systolic function, D-shaped interventricular septum suggestive of RV pressure/volume overload, PASP 46 mmHg.  Low output HF by RHC in 11/18 (CI 1.65, mean PCWP 29).  Low co-ox initially at 55%, started on milrinone with co-ox up to 72%. Patient much warmer on exam. SBP 90s-100s. Creatinine stable.  CVP up to 9.  Co-ox this morning lower at 50% but done in early am while patient asleep.   - Continue milrinone 0.25 mcg/kg/min today and will re-send co-ox.   - Continue digoxin.  - Start on Lasix 40 mg daily with rising CVP (was on Lasix at home).   - Stop ivabradine with atrial fibrillation last night.  - Noted to have wide LBBB => CRT may be beneficial down the road.  - Cardiac MRI may be helpful when more stable.  3. CAD: Patient had extensive CAD on 11/18 cath but primarily nonobstructive.  Does not explain cardiomyopathy.  - Continue statin.  4. Suspected atrial fibrillation with RVR: Remains in NSR now on  amiodarone.  5. Heme+ stool: Stable hgb.  Possibly due to swallowing hemoptysis.   Mobilize, out of bed.   [name] 02/16/2017 9:19 AM

## 2017-02-16 NOTE — Progress Notes (Signed)
ANTICOAGULATION CONSULT NOTE Pharmacy Consult for Heparin Indication: pulmonary embolus  No Known Allergies  Patient Measurements: Height: 5\' 10"  (177.8 cm) Weight: 173 lb 8 oz (78.7 kg) IBW/kg (Calculated) : 73 HEPARIN DW (KG): 77.2  Vital Signs: Temp: 97.5 F (36.4 C) (01/27 0926) Temp Source: Oral (01/27 0926) BP: 96/71 (01/27 0900) Pulse Rate: 97 (01/27 0900)  Labs: Recent Labs    02/14/17 0248 02/14/17 0834 02/15/17 0500 02/15/17 0911 02/15/17 1757 02/16/17 0250 02/16/17 0731 02/16/17 0732  HGB 13.7  --   --  13.8 13.3 13.8  --  13.6  HCT 39.5  --   --  40.2 39.0 39.1  --  39.7  PLT Stephen*  --   --  166 175 161  --  189  APTT  --   --   --   --   --   --  111*  --   HEPARINUNFRC 0.44 0.54 >2.20*  --   --   --   --   --   CREATININE 0.64  --   --  0.57*  --  0.60*  --   --     Estimated Creatinine Clearance: 98.9 mL/min (A) (by C-G formula based on SCr of 0.6 mg/dL (L)).  Assessment: Stephen Fleming presents with PE/DVT initially started on IV heparin. Briefly transitioned to apixaban but now back on IV heparin due to concern for GIB. APTT this AM is slightly above goal at 111. No new bleeding noted.   Goal of Therapy:  Heparin level goal 0.3-0.7 APTT goal 66-102 Monitor platelets by anticoagulation protocol: Yes   Plan:  Reduce heparin gtt to 1500 units/hr Check an 8 hr aPTT Daily aPTT, heparin level and CBC  Lysle Pearl, PharmD, BCPS 02/16/2017 9:28 AM

## 2017-02-16 NOTE — Progress Notes (Signed)
PROGRESS NOTE  Stephen Fleming  HRC:[id number] DOB: Jul 07, 1955 DOA: 02/10/2017 PCP: Louie Boston., MD   Brief Narrative: Stephen Fleming is a 62 y.o. male with a history of chronic HFrEF, alcohol abuse (stopped Nov 2018), CAD [facility] Nov 2018 showed diffuse stenoses managed medically), HTN, OSA intolerant of CPAP, and gout who presented to [facility] 1/21 with bilateral leg pain, hemoptysis, severe dyspnea on exertion, found to have bilateral LE DVT's and acute submassive PE on CTA with RV/LV of 1, right heart strain noted on TTE. He was placed on heparin, somewhat hypotensive and was transferred to [facility] for consideration of thrombolytics. No thrombolytics were indicated. Heart failure team was consulted for acute systolic LV and right heart failure, echo showing started on milrinone. CVP remains low, so holding on diuresis.   Assessment & Plan: Active Problems:   Dyspnea   Chronic combined systolic and diastolic CHF (congestive heart failure) (HCC)   DVT of lower extremity, bilateral (HCC)   Hemoptysis   Acute pulmonary embolism (HCC)   Occult blood in stools   Long term current use of anticoagulant therapy  Acute hypoxic respiratory failure: Due to PE, acute CHF.  - Supplemental oxygen prn. - Treat conditions as below.  - Recommend OOB  Acute submassive PE with right heart strain and bilateral acute DVTs: Suspect LLL atelectasis due to pulmonary infarct.  - Continue heparin gtt for now.  - Monitor CBC  Possible GI bleeding: Rusty stool +FOBT 1/26 due to occult GIB vs. swallowed hemoptysis.  reported this PM, +FOBT. - Trend CBC, currently not anemic so there is no currently significant large volume GI bleed.  - Empiric PPI - Please order NM tagged RBC scan if gross GI bleeding. - GI consulted.   Acute on chronic systolic CHF: EF worsened 20-25% -> 10-15%. Possibly related to chronic EtOH in addition to medically managed CAD.  - Appreciate HF team involvement. On milrinone, anticipate titration  down if improving by 1/27. Started digoxin. - CVP stable, coox improved.  - CVP up this AM, HF team starting lasix.   PAF with RVR:  - On amiodarone gtt - Heparin - Stopped ivabradine  CAD w/LBBB: [facility] Nov 2018 with  - Continue statin, anticoagulation - Per HF, CRT indicated pending clinical course  Right atrial thrombus:  - Continue heparin.   EtOH abuse: Not recently. CIWA scores very low.  - Continue supplements, DC CIWA  DVT prophylaxis: Eliquis Code Status: Full Family Communication: None at bedside Disposition Plan: Uncertain. Not stable for therapy currently.   Consultants:   PCCM primary through 1/24  HF team  Procedures:  Echo 02/11/2017 Left ventricle: The cavity size was moderately dilated. Wall thickness was normal. The estimated ejection fraction was in the range of 10% to 15%. Diffuse hypokinesis. The study is not technically sufficient to allow evaluation of LV diastolic function. - Regional wall motion abnormality: Akinesis of the basal-mid anteroseptal myocardium. - Aortic valve: There was mild regurgitation. Valve area (VTI): 3.88 cm^2. Valve area (Vmax): 3.22 cm^2. Valve area (Vmean): 3.23 cm^2. - Mitral valve: Mildly calcified annulus. There was mild regurgitation. - Left atrium: The atrium was severely dilated. - Right ventricle: The ventricular septum is flattend in systole and diastole consistent with RV pressure and volume overload. Systolic function was moderately reduced. TAPSE: 12.6 mm . - Right atrium: There is a semimobile linear structure in the right atrium that likely represents a thrombus. The atrium was moderately dilated. - Atrial septum: No defect or patent foramen ovale was  identified. - Tricuspid valve: There was moderate regurgitation. - Pulmonary arteries: Systolic pressure was moderately increased. PA peak pressure: 46 mm Hg (S). - Inferior vena cava: The vessel was dilated. The  respirophasic diameter changes were blunted (<50%), consistent with elevated central venous pressure.  ECHO 11/2016 EF 20-25%  RV mild/mod dilated. RV normal.     LHC/RHC 12/04/2016  Prox RCA to Mid RCA lesion is 30% stenosed.  RPDA lesion is 50% stenosed.  Mid LAD lesion is 25% stenosed.  Prox LAD lesion is 20% stenosed.  Ost 1st Diag lesion is 60% stenosed.  Ost 1st Mrg lesion is 95% stenosed.  Ost 2nd Mrg lesion is 70% stenosed.  Hemodynamic findings consistent with moderate pulmonary hypertension. 1. CAD with 60% first diagonal, 95% ostial small OM1, 70% OM2, and 50% mid PDA 2. Moderate pulmonary HTN with mean PAP 37 mm Hg 3. Moderately elevated LV filling pressures with PCWP of 29 mm Hg 4. Reduced CO with index 1.65.   Antimicrobials:  None  Subjective: No further BM since yesterday. Is having hemoptysis this AM. Chest pain stable, has not been up except to urinate.   Objective: Vitals:   02/16/17 1100 02/16/17 1130 02/16/17 1200 02/16/17 1241  BP: 98/81 106/74    Pulse: (!) 105 (!) 104 (!) 106   Resp: (!) 28 (!) 23 20   Temp:    98.6 F (37 C)  TempSrc:    Oral  SpO2: 91% 94% 93%   Weight:      Height:        Intake/Output Summary (Last 24 hours) at 02/16/2017 1245 Last data filed at 02/16/2017 1100 Gross per 24 hour  Intake 795.15 ml  Output 1025 ml  Net -229.85 ml   Filed Weights   02/14/17 0433 02/15/17 0416 02/16/17 0743  Weight: 77 kg (169 lb 12.1 oz) 77.1 kg (169 lb 15.6 oz) 78.7 kg (173 lb 8 oz)    Gen: 62 y.o. male in no distress  Pulm: Mildly labored, crackles at bases posteriorly.   CV: Regular tachycardia. No murmur, rub, or gallop. No JVD. GI: Abdomen soft, non-tender, non-distended, with normoactive bowel sounds. No organomegaly or masses felt. Ext: Warm, trace bilateral LE edema. Neg homan's. Skin: RUE PICC c/d/i Neuro: Alert and oriented. No focal neurological deficits. Psych: Judgement and insight appear normal. Mood &  affect appropriate.   CBC: Recent Labs  Lab 02/10/17 1958  02/14/17 0248 02/15/17 0911 02/15/17 1757 02/16/17 0250 02/16/17 0732  WBC 10.7*   < > 12.2* 19.3* 20.5* 19.1* 21.7*  NEUTROABS 7.6  --   --   --   --   --   --   HGB 18.1*   < > 13.7 13.8 13.3 13.8 13.6  HCT 54.4*   < > 39.5 40.2 39.0 39.1 39.7  MCV 94.8   < > 92.9 93.9 93.5 92.4 93.9  PLT PLATELET CLUMPS NOTED ON SMEAR, COUNT APPEARS ADEQUATE   < > 107* 166 175 161 189   < > = values in this interval not displayed.   Basic Metabolic Panel: Recent Labs  Lab 02/12/17 0327 02/13/17 0215 02/14/17 0248 02/15/17 0911 02/16/17 0250  NA 134* 134* 135 133* 132*  K 5.0 4.4 4.1 3.8 3.9  CL 100* 103 101 98* 98*  CO2 22 21* 24 24 26   GLUCOSE 123* 95 106* 114* 133*  BUN 31* 28* 19 14 17   CREATININE 1.10 0.71 0.64 0.57* 0.60*  CALCIUM 9.1 8.6* 8.4* 8.3* 8.5*  MG 2.1  --   --   --   --   PHOS 2.9  --   --   --   --    GFR: Estimated Creatinine Clearance: 98.9 mL/min (A) (by C-G formula based on SCr of 0.6 mg/dL (L)). Liver Function Tests: Recent Labs  Lab 02/10/17 1704 02/12/17 0327  AST 59* 40  ALT 45 35  ALKPHOS 70 63  BILITOT 3.3* 2.8*  PROT 6.8 6.2*  ALBUMIN 3.8 3.3*  3.3*   No results for input(s): LIPASE, AMYLASE in the last 168 hours. No results for input(s): AMMONIA in the last 168 hours. Coagulation Profile: Recent Labs  Lab 02/10/17 1852 02/16/17 0731  INR 1.43 1.81   Cardiac Enzymes: Recent Labs  Lab 02/10/17 1704 02/12/17 0327  TROPONINI <0.03 <0.03   Recent Results (from the past 240 hour(s))  MRSA PCR Screening     Status: Abnormal   Collection Time: 02/11/17  7:59 PM  Result Value Ref Range Status   MRSA by PCR POSITIVE (A) NEGATIVE Final    Comment:        The GeneXpert MRSA Assay (FDA approved for NASAL specimens only), is one component of a comprehensive MRSA colonization surveillance program. It is not intended to diagnose MRSA infection nor to guide or monitor treatment  for MRSA infections. RESULT CALLED TO, READ BACK BY AND VERIFIED WITH: H DADAMO RN 02/12/17 119 JDW       Radiology Studies: No results found.  Scheduled Meds: . atorvastatin  40 mg Oral q1800  . Chlorhexidine Gluconate Cloth  6 each Topical Q0600  . digoxin  0.125 mg Oral Daily  . feeding supplement (ENSURE ENLIVE)  237 mL Oral BID BM  . folic acid  1 mg Oral Daily  . furosemide  40 mg Oral Daily  . mometasone-formoterol  2 puff Inhalation BID  . multivitamin with minerals  1 tablet Oral Daily  . pantoprazole (PROTONIX) IV  40 mg Intravenous Q12H  . QUEtiapine  25 mg Oral QHS  . sodium chloride flush  10-40 mL Intracatheter Q12H  . sodium chloride flush  3 mL Intravenous Q12H  . thiamine  100 mg Oral Daily   Continuous Infusions: . amiodarone 30 mg/hr (02/16/17 0800)  . heparin 1,500 Units/hr (02/16/17 0927)  . milrinone 0.25 mcg/kg/min (02/16/17 0957)     LOS: 5 days   Time spent: 35 minutes.  Hazeline Junker, MD [facility] Pager [phone]  If 7PM-7AM, please contact night-coverage [email or web address] Password [facility] 02/16/2017, 12:45 PM

## 2017-02-16 NOTE — Progress Notes (Signed)
Patient HR consistently in ST. During day shift patient's HR would increase to 140's with standing; however, tonight patient HR having bursts up into 140-160's without activity. Patient resting during these occurrences. These instances were nonsustained but occurring more frequently. Paged Cardiology night coverage and per Dr. Virgina Organ, will start patient on amiodarone gtt as this could be related to afib. Will continue to monitor patient closely.   Noe Gens, RN

## 2017-02-16 NOTE — Progress Notes (Signed)
   GI Progress Note covering for Drs. Mann & Hung   Subjective  No BM since yesterday when he had only 1 small BM. No GI complaints   Objective  Vital signs in last 24 hours: Temp:  [97.6 F (36.4 C)-98.2 F (36.8 C)] 97.6 F (36.4 C) (01/27 0045) Pulse Rate:  [65-138] 97 (01/27 0900) Resp:  [13-32] 24 (01/27 0900) BP: (88-107)/(63-80) 96/71 (01/27 0900) SpO2:  [89 %-96 %] 95 % (01/27 0900) Weight:  [173 lb 8 oz (78.7 kg)] 173 lb 8 oz (78.7 kg) (01/27 0743) Last BM Date: 02/15/17  General: Alert, well-developed, mildly SOB  Heart:  Regular rate and rhythm; no murmurs Chest: Clear to ascultation bilaterally Abdomen:  Soft, nontender and nondistended. Normal bowel sounds, without guarding, and without rebound.   Extremities:  Without edema. Neurologic:  Alert and  oriented x4; grossly normal neurologically. Psych:  Alert and cooperative. Normal mood and affect.  Intake/Output from previous day: 01/26 0701 - 01/27 0700 In: 573.8 [P.O.:360; I.V.:213.8] Out: 1025 [Urine:1025] Intake/Output this shift: No intake/output data recorded.  Lab Results: Recent Labs    02/15/17 1757 02/16/17 0250 02/16/17 0732  WBC 20.5* 19.1* 21.7*  HGB 13.3 13.8 13.6  HCT 39.0 39.1 39.7  PLT 175 161 189   BMET Recent Labs    02/14/17 0248 02/15/17 0911 02/16/17 0250  NA 135 133* 132*  K 4.1 3.8 3.9  CL 101 98* 98*  CO2 24 24 26   GLUCOSE 106* 114* 133*  BUN 19 14 17   CREATININE 0.64 0.57* 0.60*  CALCIUM 8.4* 8.3* 8.5*     Assessment & Plan   1. Heme positive stool without overt GI bleeding, without anemia. Occult GI blood loss vs swallowed blood from hemoptysis. Hb stable at 13.6 today. Remains on IV heparin. Trend CBC, heparin levels. Continue IV PPI for stress ulcer prophylaxis. Drs. Loreta Ave and [place] to consider endoscopic evaluation of heme positive stool when cardiopulmonary status has improved.   2. Massive PE, pulm infarction, right heart strain, CHF on IV heparin  Drs. Loreta Ave  and Elnoria Howard to assume GI care on Monday.    LOS: 5 days   Lorriane Dehart T. Russella Dar MD 02/16/2017, 9:21 AM

## 2017-02-17 LAB — BASIC METABOLIC PANEL
ANION GAP: 12 (ref 5–15)
BUN: 17 mg/dL (ref 6–20)
CALCIUM: 8.4 mg/dL — AB (ref 8.9–10.3)
CO2: 27 mmol/L (ref 22–32)
Chloride: 94 mmol/L — ABNORMAL LOW (ref 101–111)
Creatinine, Ser: 0.73 mg/dL (ref 0.61–1.24)
GFR calc Af Amer: >60 mL/min (ref >60)
Glucose, Bld: 109 mg/dL — ABNORMAL HIGH (ref 65–99)
POTASSIUM: 3.9 mmol/L (ref 3.5–5.1)
SODIUM: 133 mmol/L — AB (ref 135–145)

## 2017-02-17 LAB — CBC
HEMATOCRIT: 36.5 % — AB (ref 39.0–52.0)
HEMOGLOBIN: 12.4 g/dL — AB (ref 13.0–17.0)
MCH: 31.4 pg (ref 26.0–34.0)
MCHC: 34 g/dL (ref 30.0–36.0)
MCV: 92.4 fL (ref 78.0–100.0)
Platelets: 196 10*3/uL (ref 150–400)
RBC: 3.95 MIL/uL — ABNORMAL LOW (ref 4.22–5.81)
RDW: 14.5 % (ref 11.5–15.5)
WBC: 20.4 10*3/uL — AB (ref 4.0–10.5)

## 2017-02-17 LAB — GLUCOSE, CAPILLARY
GLUCOSE-CAPILLARY: 109 mg/dL — AB (ref 65–99)
Glucose-Capillary: 105 mg/dL — ABNORMAL HIGH (ref 65–99)

## 2017-02-17 LAB — APTT: APTT: 95 s — AB (ref 24–36)

## 2017-02-17 LAB — COOXEMETRY PANEL
CARBOXYHEMOGLOBIN: 1.6 % — AB (ref 0.5–1.5)
Methemoglobin: 0.7 % (ref 0.0–1.5)
O2 Saturation: 64.3 %
Total hemoglobin: 13.5 g/dL (ref 12.0–16.0)

## 2017-02-17 LAB — PROTHROMBIN GENE MUTATION

## 2017-02-17 LAB — HEPARIN LEVEL (UNFRACTIONATED): HEPARIN UNFRACTIONATED: 1.04 [IU]/mL — AB (ref 0.30–0.70)

## 2017-02-17 LAB — FACTOR 5 LEIDEN

## 2017-02-17 MED ORDER — POTASSIUM CHLORIDE CRYS ER 20 MEQ PO TBCR
40.0000 meq | EXTENDED_RELEASE_TABLET | Freq: Once | ORAL | Status: AC
  Administered 2017-02-17: 40 meq via ORAL
  Filled 2017-02-17: qty 2

## 2017-02-17 MED ORDER — FUROSEMIDE 10 MG/ML IJ SOLN
40.0000 mg | Freq: Two times a day (BID) | INTRAMUSCULAR | Status: DC
  Administered 2017-02-17 – 2017-02-18 (×4): 40 mg via INTRAVENOUS
  Filled 2017-02-17 (×5): qty 4

## 2017-02-17 MED ORDER — APIXABAN 5 MG PO TABS
10.0000 mg | ORAL_TABLET | Freq: Two times a day (BID) | ORAL | Status: DC
  Administered 2017-02-17 – 2017-02-20 (×6): 10 mg via ORAL
  Filled 2017-02-17 (×8): qty 2

## 2017-02-17 MED ORDER — APIXABAN 5 MG PO TABS
5.0000 mg | ORAL_TABLET | Freq: Two times a day (BID) | ORAL | Status: DC
Start: 2017-02-24 — End: 2017-02-20

## 2017-02-17 NOTE — Progress Notes (Signed)
Patient ID: Stephen Fleming, male   DOB: 1955/05/25, 62 y.o.   MRN: [id number]     Advanced Heart Failure Rounding Note  HF Cardiology: Dr Shirlee Latch  Subjective:    Continues on milrinone 0.25. CVP 13 today, pending co-ox.   He remains in NSR on amiodarone.   Dyspnea with exertion, none at rest.  Feels fatigued.   No overt GI bleeding, stable hemoglobin.     Objective:   Weight Range: 172 lb 13.5 oz (78.4 kg) Body mass index is 24.8 kg/m.   Vital Signs:   Temp:  [97.5 F (36.4 C)-98.6 F (37 C)] 97.5 F (36.4 C) (01/28 0711) Pulse Rate:  [71-116] 100 (01/28 0800) Resp:  [19-36] 19 (01/28 0800) BP: (93-119)/(65-94) 105/70 (01/28 0800) SpO2:  [90 %-97 %] 93 % (01/28 0800) Weight:  [172 lb 13.5 oz (78.4 kg)] 172 lb 13.5 oz (78.4 kg) (01/28 0500) Last BM Date: 02/16/17  Weight change: Filed Weights   02/15/17 0416 02/16/17 0743 02/17/17 0500  Weight: 169 lb 15.6 oz (77.1 kg) 173 lb 8 oz (78.7 kg) 172 lb 13.5 oz (78.4 kg)    Intake/Output:   Intake/Output Summary (Last 24 hours) at 02/17/2017 0932 Last data filed at 02/17/2017 0600 Gross per 24 hour  Intake 1150.45 ml  Output 1225 ml  Net -74.55 ml      Physical Exam  CVP 13 General: NAD Neck: JVP 10-11 cm, no thyromegaly or thyroid nodule.  Lungs: Clear to auscultation bilaterally with normal respiratory effort. CV: Lateral PMI.  Heart mildly tachy, regular S1/S2, no S3/S4, no murmur.  1+ edema to knees bilaterally.  Abdomen: Soft, nontender, no hepatosplenomegaly, no distention.  Skin: Intact without lesions or rashes.  Neurologic: Alert and oriented x 3.  Psych: Normal affect. Extremities: No clubbing or cyanosis.  HEENT: Normal.    Telemetry   NSR 100s, no atrial fibrillation (personally reviewed)  EKG    N/A   Labs    CBC Recent Labs    02/16/17 0732 02/17/17 0425  WBC 21.7* 20.4*  HGB 13.6 12.4*  HCT 39.7 36.5*  MCV 93.9 92.4  PLT 189 196   Basic Metabolic Panel Recent Labs     02/16/17 0250 02/17/17 0425  NA 132* 133*  K 3.9 3.9  CL 98* 94*  CO2 26 27  GLUCOSE 133* 109*  BUN 17 17  CREATININE 0.60* 0.73  CALCIUM 8.5* 8.4*   Liver Function Tests No results for input(s): AST, ALT, ALKPHOS, BILITOT, PROT, ALBUMIN in the last 72 hours. No results for input(s): LIPASE, AMYLASE in the last 72 hours. Cardiac Enzymes No results for input(s): CKTOTAL, CKMB, CKMBINDEX, TROPONINI in the last 72 hours.  BNP: BNP (last 3 results) Recent Labs    12/04/16 0030  BNP 1,027.4*    ProBNP (last 3 results) No results for input(s): PROBNP in the last 8760 hours.   D-Dimer No results for input(s): DDIMER in the last 72 hours. Hemoglobin A1C No results for input(s): HGBA1C in the last 72 hours. Fasting Lipid Panel No results for input(s): CHOL, HDL, LDLCALC, TRIG, CHOLHDL, LDLDIRECT in the last 72 hours. Thyroid Function Tests No results for input(s): TSH, T4TOTAL, T3FREE, THYROIDAB in the last 72 hours.  Invalid input(s): FREET3  Other results:   Imaging    No results found.   Medications:     Scheduled Medications: . atorvastatin  40 mg Oral q1800  . digoxin  0.125 mg Oral Daily  . feeding supplement (ENSURE ENLIVE)  237  mL Oral BID BM  . folic acid  1 mg Oral Daily  . furosemide  40 mg Oral Daily  . mometasone-formoterol  2 puff Inhalation BID  . multivitamin with minerals  1 tablet Oral Daily  . pantoprazole (PROTONIX) IV  40 mg Intravenous Q12H  . QUEtiapine  25 mg Oral QHS  . sodium chloride flush  10-40 mL Intracatheter Q12H  . sodium chloride flush  3 mL Intravenous Q12H  . thiamine  100 mg Oral Daily    Infusions: . amiodarone 30 mg/hr (02/17/17 0800)  . heparin 1,500 Units/hr (02/17/17 0800)  . milrinone 0.25 mcg/kg/min (02/17/17 0800)    PRN Medications: acetaminophen **OR** acetaminophen, albuterol, diazepam, ondansetron **OR** ondansetron (ZOFRAN) IV, sodium chloride flush, traMADol    Patient Profile   Stephen Fleming is a  74 year old with history of  Combined systolic/diastoic heart failure, CAD (cath in 11/2016 showing60% first diagonal, 95% ostial small OM1, 70% OM2, and 50% mid PDA--> medical management recommended, moderate Stephen, HTN, OSA intolerant CPAP.   Admitted with bilateral leg pain and dyspnea. Acute DVT/PE  Assessment/Plan   1. Bilateral DVTs with PE and pulmonary infarction: Did not have thrombolysis.  Thrombus in transit seen in RA on echo. Still some hemoptysis but less than initially.   - Continue heparin gtt rather than DOAC with concern for GI bleeding => will need to transition to oral med soon, hemoglobin stable.  2. Acute on chronic systolic CHF: Nonischemic cardiomyopathy, out of proportion to degree of CAD.  May be related to prior heavy ETOH.  Echo (1/19) with moderate LV dilation, EF 10-15%, moderately decreased RV systolic function, D-shaped interventricular septum suggestive of RV pressure/volume overload, PASP 46 mmHg.  Low output HF by RHC in 11/18 (CI 1.65, mean PCWP 29).  Low co-ox initially at 55%, started on milrinone with co-ox up to 72%. Patient much warmer on exam. SBP 90s-100s. Creatinine stable.  CVP up to 13 today, volume up on exam.  Pending co-ox.  - Continue milrinone 0.25 mcg/kg/min today and need to send co-ox (discussed with nursing).   - Continue digoxin.  - Lasix 40 mg IV bid.  - Off ivabradine with paroxysmal atrial fibrillation.  - Noted to have wide LBBB => CRT may be beneficial down the road.  - Cardiac MRI may be helpful when more stable.  3. CAD: Patient had extensive CAD on 11/18 cath but primarily nonobstructive.  Does not explain cardiomyopathy.  - Continue statin.  4. Suspected atrial fibrillation with RVR: Remains in NSR now on amiodarone.  5. Heme+ stool: Stable hgb.  Possibly due to swallowing hemoptysis.   Needs to get out of bed, work with PT.  Very weak.  OK for step-down from my standpoint.   Stephen Fleming 02/17/2017 9:32 AM

## 2017-02-17 NOTE — Progress Notes (Signed)
ANTICOAGULATION CONSULT NOTE Pharmacy Consult for heparin --> apixaban Indication: pulmonary embolus  No Known Allergies  Patient Measurements: Height: 5\' 10"  (177.8 cm) Weight: 172 lb 13.5 oz (78.4 kg) IBW/kg (Calculated) : 73 HEPARIN DW (KG): 77.2  Vital Signs: Temp: 98.1 F (36.7 C) (01/28 1158) Temp Source: Oral (01/28 1158) BP: 92/72 (01/28 1420) Pulse Rate: 100 (01/28 1420)  Labs: Recent Labs    02/15/17 0500 02/15/17 0911  02/16/17 0250  02/16/17 0731 02/16/17 0732 02/16/17 1855 02/16/17 2100 02/17/17 0425  HGB  --  13.8   < > 13.8  --   --  13.6  --   --  12.4*  HCT  --  40.2   < > 39.1  --   --  39.7  --   --  36.5*  PLT  --  166   < > 161  --   --  189  --   --  196  APTT  --   --   --   --    < > 111*  --  >200* 78* 95*  LABPROT  --   --   --   --   --  20.8*  --   --   --   --   INR  --   --   --   --   --  1.81  --   --   --   --   HEPARINUNFRC >2.20*  --   --   --   --   --   --   --   --  1.04*  CREATININE  --  0.57*  --  0.60*  --   --   --   --   --  0.73   < > = values in this interval not displayed.    Estimated Creatinine Clearance: 98.9 mL/min (by C-G formula based on SCr of 0.73 mg/dL).  Assessment: 17 yoM presents with PE/DVT initially started on IV heparin. Briefly transitioned to apixaban but back on IV heparin due to concern for GIB. Pharmacy consulted to transition back to apixaban. No further bleeding noted, Hgb stable in 12-13's, platelets are normal.  Goal of Therapy:  Therapeutic anticoagulation Monitor platelets by anticoagulation protocol: Yes   Plan:  Discontinue heparin drip at the time apixaban is given Apixaban 10 mg PO bid for 7 days followed by 5 mg PO bid Monitor for s/sx of bleeding Pharmacy signing off but will continue to follow peripherally   Loura Back, PharmD, BCPS Clinical Pharmacist Phone for today [phone] Main pharmacy - [phone] 02/17/2017 2:59 PM

## 2017-02-17 NOTE — Progress Notes (Signed)
PROGRESS NOTE  Stephen Fleming  UJW:[id number] DOB: 04/24/1955 DOA: 02/10/2017 PCP: Louie Boston., MD   Brief Narrative: Stephen Fleming is a 62 y.o. male with a history of chronic HFrEF, alcohol abuse (stopped Nov 2018), CAD [facility] Nov 2018 showed diffuse stenoses managed medically), HTN, OSA intolerant of CPAP, and gout who presented to [facility] 1/21 with bilateral leg pain, hemoptysis, severe dyspnea on exertion, found to have bilateral LE DVT's and acute submassive PE on CTA with RV/LV of 1, right heart strain noted on TTE. He was placed on heparin, somewhat hypotensive and was transferred to [facility] for consideration of thrombolytics. No thrombolytics were indicated. Heart failure team was consulted for acute systolic LV and right heart failure.  Assessment & Plan: Active Problems:   Dyspnea   Chronic combined systolic and diastolic CHF (congestive heart failure) (HCC)   DVT of lower extremity, bilateral (HCC)   Hemoptysis   Acute pulmonary embolism (HCC)   Occult blood in stools   Long term current use of anticoagulant therapy  Acute hypoxic respiratory failure: Due to PE, acute CHF.  - Supplemental oxygen prn. - Treat conditions as below.  - Recommend OOB  Acute submassive PE with right heart strain and bilateral acute DVTs: Suspect LLL atelectasis due to pulmonary infarct.  - I suspect +FOBT was from swallowed hemoptysis. Will transition heparin to eliquis this evening.  - Monitor CBC  Acute on chronic systolic CHF: EF worsened 20-25% -> 10-15%. Possibly related to chronic EtOH in addition to medically managed CAD.  - Appreciate HF team involvement. On milrinone, lasix.  PAF with RVR:  - On digoxin, amiodarone gtt - Heparin > eliquis - Stopped ivabradine  CAD w/LBBB: [facility] Nov 2018 with  - Continue statin, anticoagulation - Per HF, CRT indicated pending clinical course  Possible GI bleeding: Rusty stool +FOBT 1/26 due to occult GIB vs. swallowed hemoptysis.  reported this PM, +FOBT. GI  consulted, though symptoms resolved.  - Trend CBC, no evidence of significant GI bleed.  - PPI for SUP.  - Please order NM tagged RBC scan if gross GI bleeding.  Right atrial thrombus:  - Will transition to eliquis as above.   EtOH abuse: Not recently. CIWA discontinued without evidence of withdrawal. - Continue supplements   DVT prophylaxis: Heparin > Eliquis Code Status: Full Family Communication: None at bedside Disposition Plan: Transfer to SDU, eventual discharge to SNF.   Consultants:   PCCM primary through 1/24  HF team  Procedures:  Echo 02/11/2017 Left ventricle: The cavity size was moderately dilated. Wall thickness was normal. The estimated ejection fraction was in the range of 10% to 15%. Diffuse hypokinesis. The study is not technically sufficient to allow evaluation of LV diastolic function. - Regional wall motion abnormality: Akinesis of the basal-mid anteroseptal myocardium. - Aortic valve: There was mild regurgitation. Valve area (VTI): 3.88 cm^2. Valve area (Vmax): 3.22 cm^2. Valve area (Vmean): 3.23 cm^2. - Mitral valve: Mildly calcified annulus. There was mild regurgitation. - Left atrium: The atrium was severely dilated. - Right ventricle: The ventricular septum is flattend in systole and diastole consistent with RV pressure and volume overload. Systolic function was moderately reduced. TAPSE: 12.6 mm . - Right atrium: There is a semimobile linear structure in the right atrium that likely represents a thrombus. The atrium was moderately dilated. - Atrial septum: No defect or patent foramen ovale was identified. - Tricuspid valve: There was moderate regurgitation. - Pulmonary arteries: Systolic pressure was moderately increased. PA peak pressure: 46 mm  Hg (S). - Inferior vena cava: The vessel was dilated. The respirophasic diameter changes were blunted (<50%), consistent with elevated central venous pressure.  ECHO  11/2016 EF 20-25%  RV mild/mod dilated. RV normal.     LHC/RHC 12/04/2016  Prox RCA to Mid RCA lesion is 30% stenosed.  RPDA lesion is 50% stenosed.  Mid LAD lesion is 25% stenosed.  Prox LAD lesion is 20% stenosed.  Ost 1st Diag lesion is 60% stenosed.  Ost 1st Mrg lesion is 95% stenosed.  Ost 2nd Mrg lesion is 70% stenosed.  Hemodynamic findings consistent with moderate pulmonary hypertension. 1. CAD with 60% first diagonal, 95% ostial small OM1, 70% OM2, and 50% mid PDA 2. Moderate pulmonary HTN with mean PAP 37 mm Hg 3. Moderately elevated LV filling pressures with PCWP of 29 mm Hg 4. Reduced CO with index 1.65.   Antimicrobials:  None  Subjective: Still with intermittent hemoptysis but no further suggestion of GI bleeding. Left chest pain is stable, severe with cough/deep breathing. Mod assist to transfer to 3-in-1.   Objective: Vitals:   02/17/17 1200 02/17/17 1300 02/17/17 1400 02/17/17 1420  BP:    92/72  Pulse: (!) 101 100 99 100  Resp: 17 (!) 25 (!) 24 (!) 22  Temp:      TempSrc:      SpO2: 92% 93% 93% 95%  Weight:      Height:        Intake/Output Summary (Last 24 hours) at 02/17/2017 1451 Last data filed at 02/17/2017 1400 Gross per 24 hour  Intake 450 ml  Output 2425 ml  Net -1975 ml   Filed Weights   02/15/17 0416 02/16/17 0743 02/17/17 0500  Weight: 77.1 kg (169 lb 15.6 oz) 78.7 kg (173 lb 8 oz) 78.4 kg (172 lb 13.5 oz)    Gen: Ill-appearing, tired-appearing 62 y.o. male in no distress  Pulm: Mildly labored, clear CV: Regular tachycardia. No murmur or gallop. No JVD. GI: Abdomen soft, non-tender, non-distended, with normoactive bowel sounds. No organomegaly or masses felt. Ext: Warm, 1+ bilateral LE edema. Neg homan's. Skin: RUE PICC c/d/i Neuro: Alert and oriented. No focal neurological deficits. Psych: Judgement and insight appear normal. Mood & affect appropriate.   CBC: Recent Labs  Lab 02/10/17 1958  02/15/17 0911  02/15/17 1757 02/16/17 0250 02/16/17 0732 02/17/17 0425  WBC 10.7*   < > 19.3* 20.5* 19.1* 21.7* 20.4*  NEUTROABS 7.6  --   --   --   --   --   --   HGB 18.1*   < > 13.8 13.3 13.8 13.6 12.4*  HCT 54.4*   < > 40.2 39.0 39.1 39.7 36.5*  MCV 94.8   < > 93.9 93.5 92.4 93.9 92.4  PLT PLATELET CLUMPS NOTED ON SMEAR, COUNT APPEARS ADEQUATE   < > 166 175 161 189 196   < > = values in this interval not displayed.   Basic Metabolic Panel: Recent Labs  Lab 02/12/17 0327 02/13/17 0215 02/14/17 0248 02/15/17 0911 02/16/17 0250 02/17/17 0425  NA 134* 134* 135 133* 132* 133*  K 5.0 4.4 4.1 3.8 3.9 3.9  CL 100* 103 101 98* 98* 94*  CO2 22 21* 24 24 26 27   GLUCOSE 123* 95 106* 114* 133* 109*  BUN 31* 28* 19 14 17 17   CREATININE 1.10 0.71 0.64 0.57* 0.60* 0.73  CALCIUM 9.1 8.6* 8.4* 8.3* 8.5* 8.4*  MG 2.1  --   --   --   --   --  PHOS 2.9  --   --   --   --   --    GFR: Estimated Creatinine Clearance: 98.9 mL/min (by C-G formula based on SCr of 0.73 mg/dL). Liver Function Tests: Recent Labs  Lab 02/10/17 1704 02/12/17 0327  AST 59* 40  ALT 45 35  ALKPHOS 70 63  BILITOT 3.3* 2.8*  PROT 6.8 6.2*  ALBUMIN 3.8 3.3*  3.3*   No results for input(s): LIPASE, AMYLASE in the last 168 hours. No results for input(s): AMMONIA in the last 168 hours. Coagulation Profile: Recent Labs  Lab 02/10/17 1852 02/16/17 0731  INR 1.43 1.81   Cardiac Enzymes: Recent Labs  Lab 02/10/17 1704 02/12/17 0327  TROPONINI <0.03 <0.03   Recent Results (from the past 240 hour(s))  MRSA PCR Screening     Status: Abnormal   Collection Time: 02/11/17  7:59 PM  Result Value Ref Range Status   MRSA by PCR POSITIVE (A) NEGATIVE Final    Comment:        The GeneXpert MRSA Assay (FDA approved for NASAL specimens only), is one component of a comprehensive MRSA colonization surveillance program. It is not intended to diagnose MRSA infection nor to guide or monitor treatment for MRSA  infections. RESULT CALLED TO, READ BACK BY AND VERIFIED WITH: H DADAMO RN 02/12/17 119 JDW       Radiology Studies: No results found.  Scheduled Meds: . atorvastatin  40 mg Oral q1800  . digoxin  0.125 mg Oral Daily  . feeding supplement (ENSURE ENLIVE)  237 mL Oral BID BM  . folic acid  1 mg Oral Daily  . furosemide  40 mg Intravenous BID  . mometasone-formoterol  2 puff Inhalation BID  . multivitamin with minerals  1 tablet Oral Daily  . pantoprazole (PROTONIX) IV  40 mg Intravenous Q12H  . QUEtiapine  25 mg Oral QHS  . sodium chloride flush  10-40 mL Intracatheter Q12H  . sodium chloride flush  3 mL Intravenous Q12H  . thiamine  100 mg Oral Daily   Continuous Infusions: . amiodarone 30 mg/hr (02/17/17 1500)  . heparin 1,500 Units/hr (02/17/17 1419)  . milrinone 0.25 mcg/kg/min (02/17/17 1400)     LOS: 6 days   Time spent: 35 minutes.  Hazeline Junker, MD [facility] Pager [phone]  If 7PM-7AM, please contact night-coverage [email or web address] Password [facility] 02/17/2017, 2:51 PM

## 2017-02-17 NOTE — Progress Notes (Signed)
UNASSIGNED PATIENT Subjective: 62 year old white male with a history of CAD, HTN, OSA presented with leg pain and hemoptysis and dyspnea on exertion noted to have bilateral DVTs and PE. GI consultation was procured ulcer as there was a question of blood in the stool. Patient has not had any melena or hematochezia; he has had 2-3 dark BMs today with no evidence of bleeding. His hemoglobin has dropped slightly. He had some hemoptysis this morning and is suspect that me as he may have swallowed some of the blood.  Objective: Vital signs in last 24 hours: Temp:  [97.5 F (36.4 C)-98.6 F (37 C)] 97.5 F (36.4 C) (01/28 0711) Pulse Rate:  [71-116] 106 (01/28 0930) Resp:  [19-36] 30 (01/28 0930) BP: (90-119)/(65-94) 103/74 (01/28 0930) SpO2:  [90 %-97 %] 93 % (01/28 0930) Weight:  [78.4 kg (172 lb 13.5 oz)] 78.4 kg (172 lb 13.5 oz) (01/28 0500) Last BM Date: 02/16/17  Intake/Output from previous day: 01/27 0701 - 01/28 0700 In: 1532.7 [P.O.:474; I.V.:1058.7] Out: 1225 [Urine:1225] Intake/Output this shift: No intake/output data recorded.  General appearance: cooperative, appears stated age, fatigued, mild distress and pale Chest: CTA tachycardic with no murmur, rub or gallop.  Abdomen: soft, NTNABS  Lab Results: Recent Labs    02/16/17 0250 02/16/17 0732 02/17/17 0425  WBC 19.1* 21.7* 20.4*  HGB 13.8 13.6 12.4*  HCT 39.1 39.7 36.5*  PLT 161 189 196   BMET Recent Labs    02/15/17 0911 02/16/17 0250 02/17/17 0425  NA 133* 132* 133*  K 3.8 3.9 3.9  CL 98* 98* 94*  CO2 24 26 27   GLUCOSE 114* 133* 109*  BUN 14 17 17   CREATININE 0.57* 0.60* 0.73  CALCIUM 8.3* 8.5* 8.4*   LFT No results for input(s): PROT, ALBUMIN, AST, ALT, ALKPHOS, BILITOT, BILIDIR, IBILI in the last 72 hours. PT/INR Recent Labs    02/16/17 0731  LABPROT 20.8*  INR 1.81   Medications: I have reviewed the patient's current medications.  Assessment/Plan: 1) Blood in stool-this may be from the  hemoptysis with the patient is having. His hemoglobin has dropped slightly we need to monitor his hemoglobin closely and monitor his stools for any evidence of active GI bleeding as he is anticoagulated at this time.  2) Acute hypoxic respiratory failure due to PE. 3) Bilateral DVTs and submassive PE on Heparin and ELiquis. 4) PAF with RVR on Digoxin and Amiodarone.    LOS: 6 days   Marsha Hillman 02/17/2017, 9:52 AM

## 2017-02-17 NOTE — Progress Notes (Signed)
ANTICOAGULATION CONSULT NOTE Pharmacy Consult for Heparin Indication: pulmonary embolus  No Known Allergies  Patient Measurements: Height: 5\' 10"  (177.8 cm) Weight: 172 lb 13.5 oz (78.4 kg) IBW/kg (Calculated) : 73 HEPARIN DW (KG): 77.2  Vital Signs: Temp: 98.1 F (36.7 C) (01/28 1158) Temp Source: Oral (01/28 1158) BP: 105/67 (01/28 1100) Pulse Rate: 99 (01/28 1400)  Labs: Recent Labs    02/15/17 0500 02/15/17 0911  02/16/17 0250  02/16/17 0731 02/16/17 0732 02/16/17 1855 02/16/17 2100 02/17/17 0425  HGB  --  13.8   < > 13.8  --   --  13.6  --   --  12.4*  HCT  --  40.2   < > 39.1  --   --  39.7  --   --  36.5*  PLT  --  166   < > 161  --   --  189  --   --  196  APTT  --   --   --   --    < > 111*  --  >200* 78* 95*  LABPROT  --   --   --   --   --  20.8*  --   --   --   --   INR  --   --   --   --   --  1.81  --   --   --   --   HEPARINUNFRC >2.20*  --   --   --   --   --   --   --   --  1.04*  CREATININE  --  0.57*  --  0.60*  --   --   --   --   --  0.73   < > = values in this interval not displayed.    Estimated Creatinine Clearance: 98.9 mL/min (by C-G formula based on SCr of 0.73 mg/dL).  Assessment: 66 yoM presents with PE/DVT initially started on IV heparin. Briefly transitioned to apixaban but now back on IV heparin due to concern for GIB.   Confirmatory aPTT this AM came back within goal range at 95, on 1500 units/hr. Hgb is down slightly to 12.4, platelets are within normal limits. No infusion issues. No signs/symptoms of bleeding. Since heparin level was elevated at 1.04, with therapeutic aPTT, will continue to check both until correlate.    Goal of Therapy:  Heparin level goal 0.3-0.7 APTT goal 66-102 Monitor platelets by anticoagulation protocol: Yes   Plan:  Continue heparin gtt to 1500 units/hr Daily aPTT, heparin level and CBC Follow up plan to restart oral anticoagulation  Girard Cooter, PharmD Clinical Pharmacist  Pager:  [phone] Clinical Phone for 02/17/2017 until 3:30pm: [phone] If after 3:30pm, please call main pharmacy at [phone] 02/17/2017 2:23 PM

## 2017-02-17 NOTE — Evaluation (Signed)
Physical Therapy Evaluation Patient Details Name: Stephen Fleming MRN: [id number] DOB: [birth year] Today's Date: 02/17/2017   History of Present Illness  Stephen Fleming is a 62 year old with history of  Combined systolic/diastoic heart failure, CAD (cath in 11/2016 showing 60% first diagonal, 95% ostial small OM1, 70% OM2, and 50% mid PDA --> medical management recommended, moderate Stephen, HTN, OSA intolerant CPAP. Admitted with bilateral leg pain and dyspnea. Acute DVT/PE.  Clinical Impression  Pt admitted with above diagnosis. Pt currently with functional limitations due to the deficits listed below (see PT Problem List). Pt was unable to perform transfers with out mod assist due to weakness and poor postural stability.  Will benefit from PT to address balance, strength and endurance. May need SNF as wife cannot physically assist.  Will follow.   Pt will benefit from skilled PT to increase their independence and safety with mobility to allow discharge to the venue listed below.      Follow Up Recommendations SNF;Supervision/Assistance - 24 hour    Equipment Recommendations  Other (comment)(TBA)    Recommendations for Other Services       Precautions / Restrictions Precautions Precautions: Fall Restrictions Weight Bearing Restrictions: No      Mobility  Bed Mobility Overal bed mobility: Needs Assistance Bed Mobility: Supine to Sit     Supine to sit: Mod assist     General bed mobility comments: Needs assist  Transfers Overall transfer level: Needs assistance   Transfers: Sit to/from Stand;Squat Pivot Transfers Sit to Stand: Mod assist;+2 safety/equipment   Squat pivot transfers: Mod assist;+2 safety/equipment     General transfer comment: Pt needs mod assist to power up and mod assist to maintain standing for pivot to 3N1 and back to bed.   Ambulation/Gait                Stairs            Wheelchair Mobility    Modified Rankin (Stroke Patients Only)        Balance Overall balance assessment: Needs assistance Sitting-balance support: No upper extremity supported;Feet supported Sitting balance-Leahy Scale: Fair     Standing balance support: Bilateral upper extremity supported;During functional activity Standing balance-Leahy Scale: Poor Standing balance comment: relies on UE support                             Pertinent Vitals/Pain Pain Assessment: No/denies pain   105 bpm, 29 RR, 105/67.  93% on RA.  Home Living Family/patient expects to be discharged to:: Private residence Living Arrangements: Spouse/significant other(grandchild) Available Help at Discharge: Family;Available PRN/intermittently(wife can't physically assist, grandchild is in school) Type of Home: House Home Access: Level entry     Home Layout: Multi-level Home Equipment: Walker - 2 wheels      Prior Function Level of Independence: Needs assistance   Gait / Transfers Assistance Needed: Pt states he used RW last few weeks.   ADL's / Homemaking Assistance Needed: States his wife could help but then later states she has health problems.          Hand Dominance        Extremity/Trunk Assessment   Upper Extremity Assessment Upper Extremity Assessment: Defer to OT evaluation    Lower Extremity Assessment Lower Extremity Assessment: RLE deficits/detail;LLE deficits/detail RLE Deficits / Details: grossly 3-/5 LLE Deficits / Details: grossly 3-/5       Communication   Communication: No difficulties  Cognition Arousal/Alertness:  Awake/alert Behavior During Therapy: WFL for tasks assessed/performed Overall Cognitive Status: Within Functional Limits for tasks assessed                                        General Comments      Exercises General Exercises - Lower Extremity Ankle Circles/Pumps: AAROM;Both;5 reps;Supine Heel Slides: AAROM;Both;5 reps;Supine Hip ABduction/ADduction: AAROM;Both;10 reps;Supine    Assessment/Plan    PT Assessment Patient needs continued PT services  PT Problem List Decreased strength;Decreased activity tolerance;Decreased balance;Decreased mobility;Decreased knowledge of use of DME;Decreased safety awareness;Decreased knowledge of precautions;Cardiopulmonary status limiting activity       PT Treatment Interventions DME instruction;Gait training;Functional mobility training;Therapeutic activities;Therapeutic exercise;Balance training;Patient/family education    PT Goals (Current goals can be found in the Care Plan section)  Acute Rehab PT Goals Patient Stated Goal: to go home PT Goal Formulation: With patient Time For Goal Achievement: 03/03/17 Potential to Achieve Goals: Good    Frequency Min 3X/week   Barriers to discharge Decreased caregiver support      Co-evaluation               AM-PAC PT "6 Clicks" Daily Activity  Outcome Measure Difficulty turning over in bed (including adjusting bedclothes, sheets and blankets)?: Unable Difficulty moving from lying on back to sitting on the side of the bed? : Unable Difficulty sitting down on and standing up from a chair with arms (e.g., wheelchair, bedside commode, etc,.)?: A Lot Help needed moving to and from a bed to chair (including a wheelchair)?: A Lot Help needed walking in hospital room?: Total Help needed climbing 3-5 steps with a railing? : Total 6 Click Score: 8    End of Session Equipment Utilized During Treatment: Gait belt Activity Tolerance: Patient limited by fatigue Patient left: in bed;with call bell/phone within reach Nurse Communication: Mobility status PT Visit Diagnosis: Unsteadiness on feet (R26.81);Muscle weakness (generalized) (M62.81)    Time: 8333-8329 PT Time Calculation (min) (ACUTE ONLY): 12 min   Charges:   PT Evaluation $PT Eval Moderate Complexity: 1 Mod     PT G Codes:        Stephen Fleming,PT Acute Rehabilitation [phone] (pager)   Stephen Fleming 02/17/2017, 1:48 PM

## 2017-02-18 DIAGNOSIS — I5023 Acute on chronic systolic (congestive) heart failure: Secondary | ICD-10-CM

## 2017-02-18 DIAGNOSIS — E440 Moderate protein-calorie malnutrition: Secondary | ICD-10-CM

## 2017-02-18 LAB — COOXEMETRY PANEL
CARBOXYHEMOGLOBIN: 1.8 % — AB (ref 0.5–1.5)
METHEMOGLOBIN: 1.1 % (ref 0.0–1.5)
O2 Saturation: 64.3 %
TOTAL HEMOGLOBIN: 12 g/dL (ref 12.0–16.0)

## 2017-02-18 LAB — CBC
HEMATOCRIT: 34.2 % — AB (ref 39.0–52.0)
Hemoglobin: 11.8 g/dL — ABNORMAL LOW (ref 13.0–17.0)
MCH: 31.7 pg (ref 26.0–34.0)
MCHC: 34.5 g/dL (ref 30.0–36.0)
MCV: 91.9 fL (ref 78.0–100.0)
Platelets: 198 10*3/uL (ref 150–400)
RBC: 3.72 MIL/uL — ABNORMAL LOW (ref 4.22–5.81)
RDW: 14.7 % (ref 11.5–15.5)
WBC: 20.4 10*3/uL — AB (ref 4.0–10.5)

## 2017-02-18 LAB — BASIC METABOLIC PANEL
ANION GAP: 15 (ref 5–15)
BUN: 17 mg/dL (ref 6–20)
CALCIUM: 7.8 mg/dL — AB (ref 8.9–10.3)
CO2: 26 mmol/L (ref 22–32)
Chloride: 92 mmol/L — ABNORMAL LOW (ref 101–111)
Creatinine, Ser: 0.62 mg/dL (ref 0.61–1.24)
GFR calc Af Amer: >60 mL/min (ref >60)
Glucose, Bld: 276 mg/dL — ABNORMAL HIGH (ref 65–99)
POTASSIUM: 3.3 mmol/L — AB (ref 3.5–5.1)
SODIUM: 133 mmol/L — AB (ref 135–145)

## 2017-02-18 LAB — GLUCOSE, CAPILLARY: GLUCOSE-CAPILLARY: 137 mg/dL — AB (ref 65–99)

## 2017-02-18 LAB — HEPARIN LEVEL (UNFRACTIONATED)

## 2017-02-18 LAB — APTT: APTT: 49 s — AB (ref 24–36)

## 2017-02-18 MED ORDER — AMIODARONE HCL 200 MG PO TABS
200.0000 mg | ORAL_TABLET | Freq: Two times a day (BID) | ORAL | Status: DC
  Administered 2017-02-18 – 2017-02-25 (×14): 200 mg via ORAL
  Filled 2017-02-18 (×14): qty 1

## 2017-02-18 MED ORDER — LOSARTAN POTASSIUM 25 MG PO TABS
12.5000 mg | ORAL_TABLET | Freq: Every day | ORAL | Status: DC
Start: 2017-02-18 — End: 2017-02-21
  Administered 2017-02-18 – 2017-02-20 (×3): 12.5 mg via ORAL
  Filled 2017-02-18 (×2): qty 1
  Filled 2017-02-18: qty 0.5

## 2017-02-18 MED ORDER — POTASSIUM CHLORIDE CRYS ER 20 MEQ PO TBCR
40.0000 meq | EXTENDED_RELEASE_TABLET | Freq: Once | ORAL | Status: AC
  Administered 2017-02-18: 40 meq via ORAL
  Filled 2017-02-18: qty 2

## 2017-02-18 MED ORDER — ENSURE ENLIVE PO LIQD
237.0000 mL | Freq: Four times a day (QID) | ORAL | Status: DC
  Administered 2017-02-18 – 2017-02-21 (×15): 237 mL via ORAL
  Administered 2017-02-22: 1 via ORAL
  Administered 2017-02-22 – 2017-02-26 (×9): 237 mL via ORAL

## 2017-02-18 NOTE — Progress Notes (Signed)
PROGRESS NOTE  Stephen Fleming  ZOX:[id number] DOB: January 12, 1956 DOA: 02/10/2017 PCP: Louie Boston., MD   Brief Narrative: Stephen Fleming is a 62 y.o. male with a history of chronic HFrEF, alcohol abuse (stopped Nov 2018), CAD [facility] Nov 2018 showed diffuse stenoses managed medically), HTN, OSA intolerant of CPAP, and gout who presented to [facility] 1/21 with bilateral leg pain, hemoptysis, severe dyspnea on exertion, found to have bilateral LE DVT's and acute submassive PE on CTA with RV/LV of 1, right heart strain noted on TTE. He was placed on heparin, somewhat hypotensive and was transferred to [facility] for consideration of thrombolytics. No thrombolytics were indicated. Heart failure team was consulted for acute systolic LV and right heart failure. He experienced PAF with RVR since returning to NSR on amiodarone.   Assessment & Plan: Active Problems:   Dyspnea   Chronic combined systolic and diastolic CHF (congestive heart failure) (HCC)   DVT of lower extremity, bilateral (HCC)   Hemoptysis   Acute pulmonary embolism (HCC)   Occult blood in stools   Long term current use of anticoagulant therapy   Malnutrition of moderate degree  Acute hypoxic respiratory failure: Due to PE, acute CHF.  - Supplemental oxygen prn. - Treat conditions as below.  - Recommend OOB as able.  Acute submassive PE with right heart strain and bilateral acute DVTs: Suspect LLL atelectasis due to pulmonary infarct.  - I suspect +FOBT was from swallowed hemoptysis. Transitioned heparin (was supratherapeutic) > eliquis 1/28.  - Monitor CBC  Acute on chronic systolic CHF: EF worsened 20-25% -> 10-15%. Possibly related to chronic EtOH in addition to medically managed CAD. - Appreciate HF team involvement. On milrinone, lasix.  PAF with RVR:  - On digoxin, amiodarone gtt, maintaining NSR. - Heparin > eliquis - Stopped ivabradine  CAD w/LBBB: LHC Nov 2018 > medical management. - Continue statin, anticoagulation - Per HF, CRT  indicated pending clinical course  Possible GI bleeding: Rusty stool +FOBT 1/26 due to occult GIB vs. swallowed hemoptysis.  reported this PM, +FOBT. GI consulted, though symptoms resolved.  - Trend CBC, no evidence of significant GI bleed.  - PPI for SUP.  - Please order NM tagged RBC scan if gross GI bleeding.  Right atrial thrombus:  - Transitioned to eliquis as above.   EtOH abuse: Not recently. CIWA discontinued without evidence of withdrawal. - Continue supplements   Moderate protein calorie malnutrition:  - RD consulted, supplements as ordered.   DVT prophylaxis: Heparin > Eliquis Code Status: Full Family Communication: None at bedside Disposition Plan: Transfer to SDU (still in ICU), eventual discharge to SNF.   Consultants:   PCCM primary through 1/24  HF team  Procedures:  Echo 02/11/2017 Left ventricle: The cavity size was moderately dilated. Wall thickness was normal. The estimated ejection fraction was in the range of 10% to 15%. Diffuse hypokinesis. The study is not technically sufficient to allow evaluation of LV diastolic function. - Regional wall motion abnormality: Akinesis of the basal-mid anteroseptal myocardium. - Aortic valve: There was mild regurgitation. Valve area (VTI): 3.88 cm^2. Valve area (Vmax): 3.22 cm^2. Valve area (Vmean): 3.23 cm^2. - Mitral valve: Mildly calcified annulus. There was mild regurgitation. - Left atrium: The atrium was severely dilated. - Right ventricle: The ventricular septum is flattend in systole and diastole consistent with RV pressure and volume overload. Systolic function was moderately reduced. TAPSE: 12.6 mm . - Right atrium: There is a semimobile linear structure in the right atrium that likely represents a  thrombus. The atrium was moderately dilated. - Atrial septum: No defect or patent foramen ovale was identified. - Tricuspid valve: There was moderate regurgitation. - Pulmonary  arteries: Systolic pressure was moderately increased. PA peak pressure: 46 mm Hg (S). - Inferior vena cava: The vessel was dilated. The respirophasic diameter changes were blunted (<50%), consistent with elevated central venous pressure.  ECHO 11/2016 EF 20-25%  RV mild/mod dilated. RV normal.   LHC/RHC 12/04/2016  Prox RCA to Mid RCA lesion is 30% stenosed.  RPDA lesion is 50% stenosed.  Mid LAD lesion is 25% stenosed.  Prox LAD lesion is 20% stenosed.  Ost 1st Diag lesion is 60% stenosed.  Ost 1st Mrg lesion is 95% stenosed.  Ost 2nd Mrg lesion is 70% stenosed.  Hemodynamic findings consistent with moderate pulmonary hypertension. 1. CAD with 60% first diagonal, 95% ostial small OM1, 70% OM2, and 50% mid PDA 2. Moderate pulmonary HTN with mean PAP 37 mm Hg 3. Moderately elevated LV filling pressures with PCWP of 29 mm Hg 4. Reduced CO with index 1.65.   Antimicrobials:  None  Subjective: Hemoptysis continues. Not getting up much, just transfer to [facility]. No melena or hematochezia.   Objective: Vitals:   02/18/17 1211 02/18/17 1300 02/18/17 1400 02/18/17 1456  BP:   103/72   Pulse:  100 (!) 102   Resp:  (!) 27 (!) 34   Temp: 98.9 F (37.2 C)   97.7 F (36.5 C)  TempSrc: Axillary   Oral  SpO2:  (!) 85% (!) 83%   Weight:      Height:        Intake/Output Summary (Last 24 hours) at 02/18/2017 1555 Last data filed at 02/18/2017 1400 Gross per 24 hour  Intake 1429.5 ml  Output 4000 ml  Net -2570.5 ml   Filed Weights   02/16/17 0743 02/17/17 0500 02/18/17 0500  Weight: 78.7 kg (173 lb 8 oz) 78.4 kg (172 lb 13.5 oz) 76.8 kg (169 lb 5 oz)    Gen: Ill-appearing, tired-appearing 62 y.o. male in no distress  Pulm: Mildly labored, clear CV: Regular tachycardia. No murmur or gallop. No JVD. GI: Abdomen soft, non-tender, non-distended, with normoactive bowel sounds. No organomegaly or masses felt. Ext: Warm, 1+ bilateral LE edema. Neg homan's. Skin: RUE  PICC c/d/i Neuro: Alert and oriented. No focal neurological deficits. Psych: Judgement and insight appear normal. Mood & affect appropriate.   CBC: Recent Labs  Lab 02/15/17 1757 02/16/17 0250 02/16/17 0732 02/17/17 0425 02/18/17 0531  WBC 20.5* 19.1* 21.7* 20.4* 20.4*  HGB 13.3 13.8 13.6 12.4* 11.8*  HCT 39.0 39.1 39.7 36.5* 34.2*  MCV 93.5 92.4 93.9 92.4 91.9  PLT 175 161 189 196 198   Basic Metabolic Panel: Recent Labs  Lab 02/12/17 0327  02/14/17 0248 02/15/17 0911 02/16/17 0250 02/17/17 0425 02/18/17 0531  NA 134*   < > 135 133* 132* 133* 133*  K 5.0   < > 4.1 3.8 3.9 3.9 3.3*  CL 100*   < > 101 98* 98* 94* 92*  CO2 22   < > 24 24 26 27 26   GLUCOSE 123*   < > 106* 114* 133* 109* 276*  BUN 31*   < > 19 14 17 17 17   CREATININE 1.10   < > 0.64 0.57* 0.60* 0.73 0.62  CALCIUM 9.1   < > 8.4* 8.3* 8.5* 8.4* 7.8*  MG 2.1  --   --   --   --   --   --  PHOS 2.9  --   --   --   --   --   --    < > = values in this interval not displayed.   GFR: Estimated Creatinine Clearance: 98.9 mL/min (by C-G formula based on SCr of 0.62 mg/dL). Liver Function Tests: Recent Labs  Lab 02/12/17 0327  AST 40  ALT 35  ALKPHOS 63  BILITOT 2.8*  PROT 6.2*  ALBUMIN 3.3*  3.3*   No results for input(s): LIPASE, AMYLASE in the last 168 hours. No results for input(s): AMMONIA in the last 168 hours. Coagulation Profile: Recent Labs  Lab 02/16/17 0731  INR 1.81   Cardiac Enzymes: Recent Labs  Lab 02/12/17 0327  TROPONINI <0.03   Recent Results (from the past 240 hour(s))  MRSA PCR Screening     Status: Abnormal   Collection Time: 02/11/17  7:59 PM  Result Value Ref Range Status   MRSA by PCR POSITIVE (A) NEGATIVE Final    Comment:        The GeneXpert MRSA Assay (FDA approved for NASAL specimens only), is one component of a comprehensive MRSA colonization surveillance program. It is not intended to diagnose MRSA infection nor to guide or monitor treatment for MRSA  infections. RESULT CALLED TO, READ BACK BY AND VERIFIED WITH: H DADAMO RN 02/12/17 119 JDW       Radiology Studies: No results found.  Scheduled Meds: . apixaban  10 mg Oral BID   Followed by  . [START ON 02/24/2017] apixaban  5 mg Oral BID  . atorvastatin  40 mg Oral q1800  . digoxin  0.125 mg Oral Daily  . feeding supplement (ENSURE ENLIVE)  237 mL Oral QID  . folic acid  1 mg Oral Daily  . furosemide  40 mg Intravenous BID  . mometasone-formoterol  2 puff Inhalation BID  . multivitamin with minerals  1 tablet Oral Daily  . pantoprazole (PROTONIX) IV  40 mg Intravenous Q12H  . QUEtiapine  25 mg Oral QHS  . sodium chloride flush  10-40 mL Intracatheter Q12H  . sodium chloride flush  3 mL Intravenous Q12H  . thiamine  100 mg Oral Daily   Continuous Infusions: . amiodarone 30 mg/hr (02/18/17 1201)  . milrinone 0.25 mcg/kg/min (02/18/17 1546)     LOS: 7 days   Time spent: 35 minutes.  Hazeline Junker, MD [facility] Pager [phone]  If 7PM-7AM, please contact night-coverage [email or web address] Password [facility] 02/18/2017, 3:55 PM

## 2017-02-18 NOTE — Progress Notes (Signed)
Nutrition Follow-up  DOCUMENTATION CODES:   Non-severe (moderate) malnutrition in context of chronic illness  INTERVENTION:    Ensure Enlive po QID, each supplement provides 350 kcal and 20 grams of protein  NUTRITION DIAGNOSIS:   Moderate Malnutrition related to chronic illness(CHF) as evidenced by mild muscle depletion, energy intake < 75% for > or equal to 1 month, percent weight loss(5% weight loss in one month).  Ongoing  GOAL:   Patient will meet greater than or equal to 90% of their needs  Unmet  MONITOR:   PO intake, Supplement acceptance, Weight trends, Labs  ASSESSMENT:   62 yo male with PMH of HTN, asthma, OSA, arthritis, CAD, pulmonary HTN, CHF, mitral regurgitation, who was admitted on 1/21 to [facility] with bilateral DVTs. Transferred to [facility] on 1/22.  Patient is only consuming 0-30% of meals. He is drinking Ensure Enlive supplements at least twice daily; he likes chocolate.   Family is concerned that he is not eating well and has no appetite. They think he will probably drink Ensure 4 times per day as long as it is chocolate. Labs and medications reviewed. Patient with 5% weight loss within the past month. Some of the weight loss could be related to fluids. From discussion with patient's wife and daughter regarding usual intake for the past month, patient has been consuming < 75% of his estimated energy requirement for the past month due to decline in appetite and taste changes.   Diet Order:  Diet Heart Room service appropriate? Yes; Fluid consistency: Thin  EDUCATION NEEDS:   Education needs have been addressed  Skin:  Skin Assessment: Reviewed RN Assessment  Last BM:  1/28  Height:   Ht Readings from Last 1 Encounters:  02/11/17 5\' 10"  (1.778 m)    Weight:   Wt Readings from Last 1 Encounters:  02/18/17 169 lb 5 oz (76.8 kg)    Ideal Body Weight:  75 kg  BMI:  Body mass index is 24.29 kg/m.  Estimated Nutritional Needs:   Kcal:   2000-2200  Protein:  100-115 gm  Fluid:  2 L   Joaquin Courts, RD, LDN, CNSC Pager [phone] After Hours Pager [phone]

## 2017-02-18 NOTE — Progress Notes (Signed)
Patient ID: Stephen Fleming, male   DOB: Jul 04, 1955, 62 y.o.   MRN: [id number]     Advanced Heart Failure Rounding Note  HF Cardiology: Dr Shirlee Latch  Subjective:    Coox 64.3% on milrinone 0.25. CVP 6-7 on my check, 11-12 this am per RN.   He remains in NSR on amiodarone.   Remains fatigued and dyspneic with exertion.   No overt GI bleeding. Stable Hgb.   Objective:   Weight Range: 169 lb 5 oz (76.8 kg) Body mass index is 24.29 kg/m.   Vital Signs:   Temp:  [97.7 F (36.5 C)-98.7 F (37.1 C)] 97.7 F (36.5 C) (01/29 0815) Pulse Rate:  [98-108] 108 (01/29 0914) Resp:  [17-33] 23 (01/29 0731) BP: (92-106)/(67-77) 99/70 (01/29 0700) SpO2:  [90 %-96 %] 92 % (01/29 0731) Weight:  [169 lb 5 oz (76.8 kg)] 169 lb 5 oz (76.8 kg) (01/29 0500) Last BM Date: 02/17/17  Weight change: Filed Weights   02/16/17 0743 02/17/17 0500 02/18/17 0500  Weight: 173 lb 8 oz (78.7 kg) 172 lb 13.5 oz (78.4 kg) 169 lb 5 oz (76.8 kg)    Intake/Output:   Intake/Output Summary (Last 24 hours) at 02/18/2017 0934 Last data filed at 02/18/2017 0600 Gross per 24 hour  Intake 1312.5 ml  Output 4350 ml  Net -3037.5 ml      Physical Exam  CVP 6-7 cm  General: NAD  HEENT: Normal Neck: Supple. JVP elevated. Carotids 2+ bilat; no bruits. No thyromegaly or nodule noted. Cor: PMI nondisplaced. Mildly tachy, regular.  Lungs: CTAB, normal effort. Abdomen: Soft, non-tender, non-distended, no HSM. No bruits or masses. +BS  Extremities: No cyanosis, clubbing, or rash. Trace ankle edema. Neuro: Alert & orientedx3, cranial nerves grossly intact. moves all 4 extremities w/o difficulty. Affect pleasant   Telemetry   NSR 100s, no afib, personally reviewed.   EKG    No new tracings.    Labs    CBC Recent Labs    02/17/17 0425 02/18/17 0531  WBC 20.4* 20.4*  HGB 12.4* 11.8*  HCT 36.5* 34.2*  MCV 92.4 91.9  PLT 196 198   Basic Metabolic Panel Recent Labs    40/98/11 0425 02/18/17 0531  NA  133* 133*  K 3.9 3.3*  CL 94* 92*  CO2 27 26  GLUCOSE 109* 276*  BUN 17 17  CREATININE 0.73 0.62  CALCIUM 8.4* 7.8*   Liver Function Tests No results for input(s): AST, ALT, ALKPHOS, BILITOT, PROT, ALBUMIN in the last 72 hours. No results for input(s): LIPASE, AMYLASE in the last 72 hours. Cardiac Enzymes No results for input(s): CKTOTAL, CKMB, CKMBINDEX, TROPONINI in the last 72 hours.  BNP: BNP (last 3 results) Recent Labs    12/04/16 0030  BNP 1,027.4*    ProBNP (last 3 results) No results for input(s): PROBNP in the last 8760 hours.   D-Dimer No results for input(s): DDIMER in the last 72 hours. Hemoglobin A1C No results for input(s): HGBA1C in the last 72 hours. Fasting Lipid Panel No results for input(s): CHOL, HDL, LDLCALC, TRIG, CHOLHDL, LDLDIRECT in the last 72 hours. Thyroid Function Tests No results for input(s): TSH, T4TOTAL, T3FREE, THYROIDAB in the last 72 hours.  Invalid input(s): FREET3  Other results:   Imaging    No results found.   Medications:     Scheduled Medications: . apixaban  10 mg Oral BID   Followed by  . [START ON 02/24/2017] apixaban  5 mg Oral BID  . atorvastatin  40 mg Oral q1800  . digoxin  0.125 mg Oral Daily  . feeding supplement (ENSURE ENLIVE)  237 mL Oral BID BM  . folic acid  1 mg Oral Daily  . furosemide  40 mg Intravenous BID  . mometasone-formoterol  2 puff Inhalation BID  . multivitamin with minerals  1 tablet Oral Daily  . pantoprazole (PROTONIX) IV  40 mg Intravenous Q12H  . QUEtiapine  25 mg Oral QHS  . sodium chloride flush  10-40 mL Intracatheter Q12H  . sodium chloride flush  3 mL Intravenous Q12H  . thiamine  100 mg Oral Daily    Infusions: . amiodarone 30 mg/hr (02/18/17 0129)  . milrinone 0.25 mcg/kg/min (02/17/17 2216)    PRN Medications: acetaminophen **OR** acetaminophen, albuterol, diazepam, ondansetron **OR** ondansetron (ZOFRAN) IV, sodium chloride flush, traMADol    Patient Profile     Mr Stephen Fleming is a 65 year old with history of  Combined systolic/diastoic heart failure, CAD (cath in 11/2016 showing60% first diagonal, 95% ostial small OM1, 70% OM2, and 50% mid PDA--> medical management recommended, moderate MR, HTN, OSA intolerant CPAP.   Admitted with bilateral leg pain and dyspnea. Acute DVT/PE  Assessment/Plan   1. Bilateral DVTs with PE and pulmonary infarction: Did not have thrombolysis.  Thrombus in transit seen in RA on echo. Still some hemoptysis but less than initially.   - Continue heparin gtt rather than DOAC with concern for GI bleeding => will need to transition to oral med soon, hemoglobin stable. No change. 2. Acute on chronic systolic CHF: Nonischemic cardiomyopathy, out of proportion to degree of CAD.  May be related to prior heavy ETOH.  Echo (1/19) with moderate LV dilation, EF 10-15%, moderately decreased RV systolic function, D-shaped interventricular septum suggestive of RV pressure/volume overload, PASP 46 mmHg.  Low output HF by RHC in 11/18 (CI 1.65, mean PCWP 29).  Low co-ox initially at 55%, started on milrinone with co-ox up to 72%. Patient much warmer on exam. SBP 90s-100s.  - Creatinine stable. CVP at least 6-7, but higher this am. Coox 64.3% on milrinone 0.25 mcg/kg/min.  - Continue digoxin.  - Continue Lasix 40 mg IV BID at least this am.  Re-assess this afternoon.  - Off ivabradine with paroxysmal atrial fibrillation.  - Noted to have wide LBBB => CRT may be beneficial down the road.  - Cardiac MRI may be helpful when more stable.  3. CAD: Patient had extensive CAD on 11/18 cath but primarily nonobstructive.  Does not explain cardiomyopathy.  - No s/s of ischemia.    - Continue statin.  4. Suspected atrial fibrillation with RVR:  - Remains in NSR on amio.  5. Heme+ stool:  - Stable hgb. Possibly due to swallowing hemoptysis.   Graciella Freer, PA-C  02/18/2017 9:34 AM   Advanced Heart Failure Team Pager [phone] (M-F; 7a -  4p)  Please contact [facility] for night-coverage after hours (4p -7a ) and weekends on [email or web address]  Patient seen with PA, agree with the above note.  Co-ox stable this morning, CVP lower.  On exam, JVP 8 cm, 1+ ankle edema bilaterally.  - Will start titrating down milrinone, decrease to 0.125.  - Continue Lasix 40 mg IV bid today, transition back to po tomorrow.  Good diuresis.  - Add losartan 12.5 mg daily.   Transitioned from heparin gtt to apixaban.  Remains on amiodarone gtt, can transition to po at this point.   [name] 02/18/2017 4:13 PM

## 2017-02-19 ENCOUNTER — Inpatient Hospital Stay (HOSPITAL_COMMUNITY): Payer: BLUE CROSS/BLUE SHIELD

## 2017-02-19 DIAGNOSIS — R918 Other nonspecific abnormal finding of lung field: Secondary | ICD-10-CM

## 2017-02-19 DIAGNOSIS — D72829 Elevated white blood cell count, unspecified: Secondary | ICD-10-CM

## 2017-02-19 LAB — URINALYSIS, ROUTINE W REFLEX MICROSCOPIC
Bilirubin Urine: NEGATIVE
Glucose, UA: NEGATIVE mg/dL
HGB URINE DIPSTICK: NEGATIVE
Ketones, ur: NEGATIVE mg/dL
Leukocytes, UA: NEGATIVE
Nitrite: NEGATIVE
Protein, ur: NEGATIVE mg/dL
SPECIFIC GRAVITY, URINE: 1.012 (ref 1.005–1.030)
pH: 7 (ref 5.0–8.0)

## 2017-02-19 LAB — CBC
HCT: 35.1 % — ABNORMAL LOW (ref 39.0–52.0)
Hemoglobin: 12 g/dL — ABNORMAL LOW (ref 13.0–17.0)
MCH: 31.4 pg (ref 26.0–34.0)
MCHC: 34.2 g/dL (ref 30.0–36.0)
MCV: 91.9 fL (ref 78.0–100.0)
Platelets: 244 10*3/uL (ref 150–400)
RBC: 3.82 MIL/uL — ABNORMAL LOW (ref 4.22–5.81)
RDW: 14.9 % (ref 11.5–15.5)
WBC: 23.7 10*3/uL — ABNORMAL HIGH (ref 4.0–10.5)

## 2017-02-19 LAB — BASIC METABOLIC PANEL
Anion gap: 12 (ref 5–15)
BUN: 18 mg/dL (ref 6–20)
CALCIUM: 7.9 mg/dL — AB (ref 8.9–10.3)
CO2: 29 mmol/L (ref 22–32)
Chloride: 94 mmol/L — ABNORMAL LOW (ref 101–111)
Creatinine, Ser: 0.54 mg/dL — ABNORMAL LOW (ref 0.61–1.24)
GFR calc non Af Amer: >60 mL/min (ref >60)
Glucose, Bld: 121 mg/dL — ABNORMAL HIGH (ref 65–99)
Potassium: 3.2 mmol/L — ABNORMAL LOW (ref 3.5–5.1)
SODIUM: 135 mmol/L (ref 135–145)

## 2017-02-19 LAB — COOXEMETRY PANEL
CARBOXYHEMOGLOBIN: 1.8 % — AB (ref 0.5–1.5)
METHEMOGLOBIN: 0.8 % (ref 0.0–1.5)
O2 Saturation: 69.1 %
TOTAL HEMOGLOBIN: 12.3 g/dL (ref 12.0–16.0)

## 2017-02-19 LAB — GLUCOSE, CAPILLARY: GLUCOSE-CAPILLARY: 90 mg/dL (ref 65–99)

## 2017-02-19 MED ORDER — POTASSIUM CHLORIDE CRYS ER 20 MEQ PO TBCR
40.0000 meq | EXTENDED_RELEASE_TABLET | Freq: Once | ORAL | Status: AC
  Administered 2017-02-19: 40 meq via ORAL
  Filled 2017-02-19: qty 2

## 2017-02-19 MED ORDER — VANCOMYCIN HCL 10 G IV SOLR
1500.0000 mg | Freq: Once | INTRAVENOUS | Status: AC
  Administered 2017-02-19: 1500 mg via INTRAVENOUS
  Filled 2017-02-19: qty 1500

## 2017-02-19 MED ORDER — CEFEPIME HCL 2 G IJ SOLR
2.0000 g | Freq: Three times a day (TID) | INTRAMUSCULAR | Status: DC
  Administered 2017-02-19 – 2017-02-26 (×22): 2 g via INTRAVENOUS
  Filled 2017-02-19 (×23): qty 2

## 2017-02-19 MED ORDER — FUROSEMIDE 40 MG PO TABS
40.0000 mg | ORAL_TABLET | Freq: Two times a day (BID) | ORAL | Status: DC
  Administered 2017-02-19 (×2): 40 mg via ORAL
  Filled 2017-02-19 (×2): qty 1

## 2017-02-19 MED ORDER — VANCOMYCIN HCL IN DEXTROSE 750-5 MG/150ML-% IV SOLN
750.0000 mg | Freq: Three times a day (TID) | INTRAVENOUS | Status: DC
  Administered 2017-02-19 – 2017-02-25 (×18): 750 mg via INTRAVENOUS
  Filled 2017-02-19 (×19): qty 150

## 2017-02-19 MED ORDER — SPIRONOLACTONE 12.5 MG HALF TABLET
12.5000 mg | ORAL_TABLET | Freq: Every day | ORAL | Status: DC
  Administered 2017-02-19: 12.5 mg via ORAL
  Filled 2017-02-19 (×2): qty 1

## 2017-02-19 NOTE — Plan of Care (Signed)
Patient has now transferred from ICU to [facility] care; continues to progress toward goals.  Patient is on room air, transfers w/minimal assistance OOB, is AxO and able to express needs.  CVP 6; UOP is good on Milrinone, - over last 24 hrs.  Will continue to monitor.

## 2017-02-19 NOTE — Progress Notes (Signed)
Name: Tosh Glaze MRN: [id number] DOB: 1955/02/03    ADMISSION DATE:  02/10/2017 CONSULTATION DATE:  02/11/2017  REFERRING MD:  Dr. Arbutus Leas  CHIEF COMPLAINT:  Dyspnea on exertion, cough, hemoptysis   HISTORY OF PRESENT ILLNESS:   62 year old male with past medical history significant for systolic heart failure, coronary artery disease, hypertension, and gout who presented to [facility] on 1/21 with worsening exertional shortness of breath lasting 2 days, leg swelling, chest pain, and cough.                  Found on ultrasound to have acute bilateral lower extremity DVT.  Started on heparin drip and admitted to [facility] in SDU.  CTA chest demonstrated acute submassive PE with RV/LV ratio of 1, and TTE with evidence of right heart strain. He developed mild hemoptysis on the afternoon of 1/22, systolic BP 80-90's and there was concern that patient may need IR intervention and therefore transferred to [facility] ICU on PCCM service.  He stabilized and improved with heparin and did not require EKOS. He was tx to SDU and back to hospitalist service 1/25.                 On 1/30 had worsening leukocytosis and LLL infiltrate and PCCM asked re-consult for ?HCAP.    1/21 Bilateral Venous US >>  1. Occlusive DVT in LEFT posterior tibial and popliteal veins. 2. Occlusive DVT in RIGHT posterior tibial, popliteal, and femoral Veins.  1/22 CTA chest >>  1.  Extensive partially occlusive bilateral pulmonary emboli with RV/LV ratio of 1.0 implying increased morbidity mortality. Positive for acute PE with CT evidence of right heart strain (RV/LV Ratio = 1.0) consistent with at least submassive (intermediate risk) PE.  2.  Parenchymal opacity within much of the left lower lobe and a small portion of the posterosuperior left upper lobe could represent pneumonia or developing infarction. No pleural effusion. 3.  Apparent partial atrophy of the left kidney with some compensatory hypertrophy of the right kidney.  Low-attenuation structure emanates from the periphery the right mid kidney difficult to assess on this study.  1/22 TTE >>  LVEF 10-15%; moderately dilated LV, diffuse hypokinesis, akinesis of basal mid anterior septal myocardium, mild AR, mild MV calcified annulus and mild MR; LA severely dilated, RV septum flattend in systole and diastole c/w RV pressure and volume overload with moderately reduced systolic function, a semimobile linear structure in the right, atrium that likely represents a thrombus,  atrium was moderately dilated, no PFO or defect; mod TR, PAP 46 mm Hg; IVC dilated with blunted respirophasic changes   SUBJECTIVE:   VITAL SIGNS: Temp:  [96.4 F (35.8 C)-98.1 F (36.7 C)] 96.6 F (35.9 C) (01/30 1225) Pulse Rate:  [95-112] 95 (01/30 1225) Resp:  [18-34] 18 (01/30 1225) BP: (96-116)/(64-79) 98/70 (01/30 1225) SpO2:  [83 %-95 %] 94 % (01/30 1200) Weight:  [73.6 kg (162 lb 4.8 oz)] 73.6 kg (162 lb 4.8 oz) (01/30 0415)  PHYSICAL EXAMINATION: General:  Chronically ill appearing, NAD Neuro:  Alert and interactive, moving all ext to command HEENT:  [place]/AT, PERRL, EOM-I and MMM Cardiovascular:  RRR, Nl S1/S2 and -M/R/G. Lungs:  Decreased BS on the left Abdomen:  Soft, NT, ND and +BS Musculoskeletal:  -edema and -tenderness Skin:  -edema and -tenderness  Recent Labs  Lab 02/17/17 0425 02/18/17 0531 02/19/17 0334  NA 133* 133* 135  K 3.9 3.3* 3.2*  CL 94* 92* 94*  CO2 27 26  29  BUN 17 17 18   CREATININE 0.73 0.62 0.54*  GLUCOSE 109* 276* 121*   Recent Labs  Lab 02/17/17 0425 02/18/17 0531 02/19/17 0334  HGB 12.4* 11.8* 12.0*  HCT 36.5* 34.2* 35.1*  WBC 20.4* 20.4* 23.7*  PLT 196 198 244   Dg Chest Port 1 View  Result Date: 02/19/2017 CLINICAL DATA:  Leukocytosis, cough and congestion. EXAM: PORTABLE CHEST 1 VIEW COMPARISON:  CT chest 02/11/2017 and chest radiograph 02/10/2017. FINDINGS: Trachea is midline. Heart is enlarged. Right PICC tip projects over  the low SVC. Marked increase in airspace consolidation in the left upper and left lower lobes, with a left pleural effusion. Right lung is grossly clear. IMPRESSION: 1. Marked worsening in left upper and left lower lobe consolidation, worrisome for pneumonia. Combination of pneumonia and infarct is also considered, given recent diagnosis of pulmonary emboli. 2. Left pleural effusion. Electronically Signed   By: Leanna Battles M.D.   On: 02/19/2017 10:03   Dirk Dress, NP 02/19/2017  1:47 PM Pager: ([phone] or [phone]  ASSESSMENT / PLAN:  Attending Note:  62 year old male with PE and pulmonary infarct that is presenting back to pulmonary with worsening infiltrate.  On exam, decreased BS on the left.  I reviewed CXR myself, worsening infiltrate noted.  Discussed with PCCM-NP.  Infiltrate likely due to infarction, patient is afebrile and reports no green sputum to suggest a pneumonia.  Hemoptysis 4 times per day with old blood.  Infiltrate:             - Patient already on broad spectrum abx, no need to escalate.             - F/U imaging as needed.                 - Chest CT without contrast  Hemoptysis: due to PE and pulmonary infarct             - Monitor clinically  PE:             - Eliquis  Hypoxemia:             - Titrate O2 for sat of 88-92%  Patient seen and examined, agree with above note.  I dictated the care and orders written for this patient under my direction.  Alyson Reedy, MD [phone]

## 2017-02-19 NOTE — Progress Notes (Signed)
Patient ID: Stephen Fleming, male   DOB: 01/19/1956, 62 y.o.   MRN: [id number]     Advanced Heart Failure Rounding Note  HF Cardiology: Dr Shirlee Latch  Subjective:    Todays CO-OX is 69% on milrinone 0.125 mcg. Diuresed with IV lasix. Weight down 7 pounds. Diuresed with IV lasix. Negative 1.7 liters.   Complaining of fatigue. Pain when he coughs.   Objective:   Weight Range: 162 lb 4.8 oz (73.6 kg) Body mass index is 23.29 kg/m.   Vital Signs:   Temp:  [96.4 F (35.8 C)-98.9 F (37.2 C)] 98.1 F (36.7 C) (01/30 0415) Pulse Rate:  [99-112] 102 (01/30 0415) Resp:  [20-34] 27 (01/29 2100) BP: (96-116)/(64-79) 100/64 (01/30 0415) SpO2:  [83 %-95 %] 93 % (01/30 0415) Weight:  [162 lb 4.8 oz (73.6 kg)] 162 lb 4.8 oz (73.6 kg) (01/30 0415) Last BM Date: 02/18/17  Weight change: Filed Weights   02/17/17 0500 02/18/17 0500 02/19/17 0415  Weight: 172 lb 13.5 oz (78.4 kg) 169 lb 5 oz (76.8 kg) 162 lb 4.8 oz (73.6 kg)    Intake/Output:   Intake/Output Summary (Last 24 hours) at 02/19/2017 0706 Last data filed at 02/19/2017 0415 Gross per 24 hour  Intake 926.21 ml  Output 2675 ml  Net -1748.79 ml      Physical Exam  CVP 4  General:  No resp difficulty. In bed.  HEENT: normal Neck: supple. no JVD. Carotids 2+ bilat; no bruits. No lymphadenopathy or thryomegaly appreciated. Cor: PMI nondisplaced. Regular rate & rhythm. No rubs, gallops or murmurs. Lungs: clear Abdomen: soft, nontender, nondistended. No hepatosplenomegaly. No bruits or masses. Good bowel sounds. Extremities: no cyanosis, clubbing, rash, edema. RUE PICC  Neuro: alert & orientedx3, cranial nerves grossly intact. moves all 4 extremities w/o difficulty. Affect pleasant   Telemetry  Sinus Tach 90-100s     EKG    No new tracings.    Labs    CBC Recent Labs    02/18/17 0531 02/19/17 0334  WBC 20.4* 23.7*  HGB 11.8* 12.0*  HCT 34.2* 35.1*  MCV 91.9 91.9  PLT 198 244   Basic Metabolic Panel Recent Labs    40/98/11 0531 02/19/17 0334  NA 133* 135  K 3.3* 3.2*  CL 92* 94*  CO2 26 29  GLUCOSE 276* 121*  BUN 17 18  CREATININE 0.62 0.54*  CALCIUM 7.8* 7.9*   Liver Function Tests No results for input(s): AST, ALT, ALKPHOS, BILITOT, PROT, ALBUMIN in the last 72 hours. No results for input(s): LIPASE, AMYLASE in the last 72 hours. Cardiac Enzymes No results for input(s): CKTOTAL, CKMB, CKMBINDEX, TROPONINI in the last 72 hours.  BNP: BNP (last 3 results) Recent Labs    12/04/16 0030  BNP 1,027.4*    ProBNP (last 3 results) No results for input(s): PROBNP in the last 8760 hours.   D-Dimer No results for input(s): DDIMER in the last 72 hours. Hemoglobin A1C No results for input(s): HGBA1C in the last 72 hours. Fasting Lipid Panel No results for input(s): CHOL, HDL, LDLCALC, TRIG, CHOLHDL, LDLDIRECT in the last 72 hours. Thyroid Function Tests No results for input(s): TSH, T4TOTAL, T3FREE, THYROIDAB in the last 72 hours.  Invalid input(s): FREET3  Other results:   Imaging    No results found.   Medications:     Scheduled Medications: . amiodarone  200 mg Oral BID  . apixaban  10 mg Oral BID   Followed by  . [START ON 02/24/2017] apixaban  5  mg Oral BID  . atorvastatin  40 mg Oral q1800  . digoxin  0.125 mg Oral Daily  . feeding supplement (ENSURE ENLIVE)  237 mL Oral QID  . folic acid  1 mg Oral Daily  . furosemide  40 mg Intravenous BID  . losartan  12.5 mg Oral Daily  . mometasone-formoterol  2 puff Inhalation BID  . multivitamin with minerals  1 tablet Oral Daily  . pantoprazole (PROTONIX) IV  40 mg Intravenous Q12H  . QUEtiapine  25 mg Oral QHS  . sodium chloride flush  10-40 mL Intracatheter Q12H  . sodium chloride flush  3 mL Intravenous Q12H  . thiamine  100 mg Oral Daily    Infusions: . milrinone 0.125 mcg/kg/min (02/19/17 0415)    PRN Medications: acetaminophen **OR** acetaminophen, albuterol, diazepam, ondansetron **OR** ondansetron (ZOFRAN)  IV, sodium chloride flush, traMADol    Patient Profile   Mr Ulch is a 62 year old with history of  Combined systolic/diastoic heart failure, CAD (cath in 11/2016 showing60% first diagonal, 95% ostial small OM1, 70% OM2, and 50% mid PDA--> medical management recommended, moderate MR, HTN, OSA intolerant CPAP.   Admitted with bilateral leg pain and dyspnea. Acute DVT/PE  Assessment/Plan   1. Bilateral DVTs with PE and pulmonary infarction: Did not have thrombolysis.  Thrombus in transit seen in RA on echo. Hemoptysis seems to have resolved.    - Off heparin.  - on apixaban 10 mg twice a day 2. Acute on chronic systolic CHF: Nonischemic cardiomyopathy, out of proportion to degree of CAD.  May be related to prior heavy ETOH.  Echo (1/19) with moderate LV dilation, EF 10-15%, moderately decreased RV systolic function, D-shaped interventricular septum suggestive of RV pressure/volume overload, PASP 46 mmHg.  Low output HF by RHC in 11/18 (CI 1.65, mean PCWP 29).  Low co-ox initially at 55%, started on milrinone with co-ox up to 72%.  - Todays CO-OX 69%. Stop milrinone.  - Continue digoxin.  - Volume status stable. CVP 4.  - Stop IV lasix. Start lasix 40 mg po twice a day - Continue current dose of losartan. Will add spironolactone 12.5 daily.  - Off ivabradine with paroxysmal atrial fibrillation.  - Noted to have wide LBBB => CRT may be beneficial down the road.  - Cardiac MRI may be helpful when more stable.  3. CAD: Patient had extensive CAD on 11/18 cath but primarily nonobstructive.  Does not explain cardiomyopathy.  - No S/S ischemia     - Continue statin.  4. Suspected atrial fibrillation with RVR:  - Maitaining NSR.  - on amio 200 mg twice a day - on apixaban.  5. Heme+ stool:  - Stable hgb. Possibly due to swallowing hemoptysis.  6.Severe Deconditioning PT recommending SNF.  7. Hypokalemia Supp Freddi Che, NP  02/19/2017 7:06 AM   Patient seen with NP, agree with  the above note.  He is stable clinically, good co-ox on low dose milrinone.  Not volume overloaded on exam, CVP 4.   - Stop milrinone today.  - Add spironolactone 12.5 daily.  - Transition to po Lasix.   He is in NSR, continue apixaban and amiodarone po.   He is very weak.  Will need PT work and SNF at discharge.   [name] 02/19/2017 8:07 AM    Advanced Heart Failure Team Pager [phone] (M-F; 7a - 4p)  Please contact [facility] for night-coverage after hours (4p -7a ) and weekends on [email or web address]

## 2017-02-19 NOTE — Progress Notes (Signed)
Physical Therapy Treatment Patient Details Name: Stephen Fleming MRN: [id number] DOB: 1955/03/23 Today's Date: 02/19/2017    History of Present Illness Pt is a 62 y.o. male with PMH of combined systolic/diastoic HF, CAD, moderate MR, HTN, OSA (intolerant CPAP), admitted 02/10/17 with bilateral leg pain and dyspnea. Found to have acute DVT/PE.   PT Comments    Pt slowly progressing with mobility; remains limited by significant fatigue. Able to perform repeated sit<>stands with RW, progressing to only needing min guard for balance. Amb with RW and min guard, but declining further distance secondary to fatigue. Continue to recommend SNF-level therapies at d/c. Will follow acutely.    Follow Up Recommendations  SNF;Supervision/Assistance - 24 hour     Equipment Recommendations  (TBD next venue)    Recommendations for Other Services       Precautions / Restrictions Precautions Precautions: Fall Restrictions Weight Bearing Restrictions: No    Mobility  Bed Mobility Overal bed mobility: Needs Assistance Bed Mobility: Supine to Sit;Sit to Supine     Supine to sit: Min guard;HOB elevated Sit to supine: Min guard   General bed mobility comments: Increased time and effort. Returned to supine for transport to CT  Transfers Overall transfer level: Needs assistance Equipment used: Rolling walker (2 wheeled) Transfers: Sit to/from Stand Sit to Stand: Min assist;Min guard         General transfer comment: Performed sit<>stand x6 total, initially requiring minA for balance and cues for technique/hand placement. Progressing to min guard for balance; no physical assist required  Ambulation/Gait Ambulation/Gait assistance: Min guard Ambulation Distance (Feet): 3 Feet Assistive device: Rolling walker (2 wheeled) Gait Pattern/deviations: Step-to pattern;Trunk flexed Gait velocity: Decreased Gait velocity interpretation: <1.8 ft/sec, indicative of risk for recurrent falls General Gait  Details: Slow, slightly unsteady amb with RW from bed to bedside commode. Pt declining further amb distance secondary to fatigue, but willing to perform repeated sit<>stands   Stairs            Wheelchair Mobility    Modified Rankin (Stroke Patients Only)       Balance Overall balance assessment: Needs assistance Sitting-balance support: No upper extremity supported;Feet supported Sitting balance-Leahy Scale: Fair     Standing balance support: Bilateral upper extremity supported;During functional activity Standing balance-Leahy Scale: Poor Standing balance comment: relies on UE support                            Cognition Arousal/Alertness: Awake/alert Behavior During Therapy: Flat affect Overall Cognitive Status: No family/caregiver present to determine baseline cognitive functioning Area of Impairment: Attention;Following commands;Problem solving                   Current Attention Level: Selective   Following Commands: Follows one step commands with increased time     Problem Solving: Requires verbal cues        Exercises      General Comments        Pertinent Vitals/Pain Pain Assessment: Faces Faces Pain Scale: Hurts a little bit Pain Location: Generalized Pain Descriptors / Indicators: Discomfort Pain Intervention(s): Monitored during session;Limited activity within patient's tolerance    Home Living                      Prior Function            PT Goals (current goals can now be found in the care plan section) Acute Rehab PT Goals  Patient Stated Goal: to go home PT Goal Formulation: With patient Time For Goal Achievement: 03/03/17 Potential to Achieve Goals: Good Progress towards PT goals: Progressing toward goals    Frequency    Min 2X/week      PT Plan Frequency needs to be updated    Co-evaluation              AM-PAC PT "6 Clicks" Daily Activity  Outcome Measure  Difficulty turning over  in bed (including adjusting bedclothes, sheets and blankets)?: A Little Difficulty moving from lying on back to sitting on the side of the bed? : A Little Difficulty sitting down on and standing up from a chair with arms (e.g., wheelchair, bedside commode, etc,.)?: A Little Help needed moving to and from a bed to chair (including a wheelchair)?: A Little Help needed walking in hospital room?: A Lot Help needed climbing 3-5 steps with a railing? : Total 6 Click Score: 15    End of Session Equipment Utilized During Treatment: Gait belt Activity Tolerance: Patient limited by fatigue Patient left: in bed;with call bell/phone within reach Nurse Communication: Mobility status PT Visit Diagnosis: Unsteadiness on feet (R26.81);Muscle weakness (generalized) (M62.81)     Time: 6803-2122 PT Time Calculation (min) (ACUTE ONLY): 27 min  Charges:  $Therapeutic Exercise: 8-22 mins $Therapeutic Activity: 8-22 mins                    G Codes:      Ina Homes, PT, DPT Acute Rehab Services  Pager: [phone]  Malachy Chamber 02/19/2017, 2:38 PM

## 2017-02-19 NOTE — Care Management Note (Signed)
Case Management Note  Patient Details  Name: Stephen Fleming MRN: [id number] Date of Birth: 05/19/1955  Subjective/Objective:    Pt admitted with Bil DVTs and submassive PE with right heart strain                Action/Plan:  PTA from home.  SNF recommended - CSW consulted   Expected Discharge Date:                  Expected Discharge Plan:  Skilled Nursing Facility  In-House Referral:  Clinical Social Work  Discharge planning Services  CM Consult  Post Acute Care Choice:    Choice offered to:     DME Arranged:    DME Agency:     HH Arranged:    HH Agency:     Status of Service:     If discussed at [employer], dates discussed:    Additional Comments:  Cherylann Parr, RN 02/19/2017, 9:38 AM

## 2017-02-19 NOTE — Progress Notes (Addendum)
PROGRESS NOTE    Stephen Fleming  CEY:[id number] DOB: 09-Nov-1955 DOA: 02/10/2017 PCP: Louie Boston., MD    Brief Narrative: Stephen Fleming is a 62 y.o. male with a history of chronic HFrEF, alcohol abuse (stopped Nov 2018), CAD [facility] Nov 2018 showed diffuse stenoses managed medically), HTN, OSA intolerant of CPAP, and gout who presented to [facility] 1/21 with bilateral leg pain, hemoptysis, severe dyspnea on exertion, found to have bilateral LE DVT's and acute submassive PE on CTA with RV/LV of 1, right heart strain noted on TTE. He was placed on heparin, somewhat hypotensive and was transferred to [facility] for consideration of thrombolytics. No thrombolytics were indicated. Heart failure team was consulted for acute systolic LV and right heart failure. He experienced PAF with RVR since returning to [facility] on amiodarone.       Assessment & Plan:   Active Problems:   Acute on chronic systolic CHF (congestive heart failure) (HCC)   Dyspnea   Chronic combined systolic and diastolic CHF (congestive heart failure) (HCC)   DVT of lower extremity, bilateral (HCC)   Hemoptysis   Acute pulmonary embolism (HCC)   Occult blood in stools   Long term current use of anticoagulant therapy   Malnutrition of moderate degree   1-Acute hypoxic respiratory failure: Due to PE, acute CHF.  Treated with IV heparin, IV lasix.  Stable. On RA<   2-Acute submassive PE with right heart strain and bilateral acute DVTs: Suspect LLL atelectasis due to pulmonary infarct.  Was treated with IV heparin. Transition to Eliquis on 02-17-2017.   Evaluated by CCM and IR. No EKOS. If hemoptysis get worse , could consider IVC filter.  Patient report hemoptysis the same, not worse.   Acute on chronic systolic CHF: EF worsened 20-25% -> 10-15%. Possibly related to chronic EtOH in addition to medically managed CAD. HF team following.  On oral lasix. Started on cozaar.   Leukocytosis; chest x ray with worsening infiltrates PNA vs infarct.  Started IV antibiotics. CCM consulted again.  Will check chest x ray and UA.  Start antibiotics depending on results.    PAF with RVR:  On digoxin, amiodarone and eliquis.   CAD w/LBBB: LHC Nov 2018 > medical management. - Continue statin, anticoagulation   ? Possible GI bleeding: Rusty stool +FOBT 1/26 due to occult GIB vs. swallowed hemoptysis. +FOBT. GI consulted, though symptoms resolved.  Hb has remain stable.  Will consider NM tagged RBC scan if gross GI bleed.  GI Consulted, Dr Loreta Ave, evaluated patient. Occult blood positive might be related to hemoptysis.    Right atrial thrombus:  - Transitioned to eliquis as above.   EtOH abuse: Not recently. CIWA discontinued without evidence of withdrawal. - Continue supplements   Moderate protein calorie malnutrition:  - RD consulted, supplements as ordered.      DVT prophylaxis: Eliquis  Code Status: full code.  Family Communication: care discussed with patient  Disposition Plan: Needs SNF   Consultants:   Cardiology   PCCM primary through 1/24      Procedures:  Echo1/22/2019 Left ventricle: The cavity size was moderately dilated. Wall thickness was normal. The estimated ejection fraction was in the range of 10% to 15%. Diffuse hypokinesis. The study is not technically sufficient to allow evaluation of LV diastolic function. - Regional wall motion abnormality: Akinesis of the basal-mid anteroseptal myocardium. - Aortic valve: There was mild regurgitation.  - Mitral valve: Mildly calcified annulus. There was mild regurgitation. - Left atrium: The atrium was severely  dilated. - Right ventricle: The ventricular septum is flattend in systole and diastole consistent with RV pressure and volume overload. Systolic function was moderately reduced. TAPSE: 12.6 mm . - Right atrium: There is a semimobile linear structure in the right atrium that likely represents a thrombus. The atrium  was moderately dilated. - Atrial septum: No defect or patent foramen ovale was identified. - Tricuspid valve: There was moderate regurgitation. - Pulmonary arteries: Systolic pressure was moderately increased. PA peak pressure: 46 mm Hg (S). - Inferior vena cava: The vessel was dilated. The respirophasic diameter changes were blunted (<50%), consistent with elevated central venous pressure.  ECHO 11/2016 EF 20-25% RV mild/mod dilated. RV normal.   LHC/RHC 12/04/2016  Prox RCA to Mid RCA lesion is 30% stenosed.  RPDA lesion is 50% stenosed.  Mid LAD lesion is 25% stenosed.  Prox LAD lesion is 20% stenosed.  Ost 1st Diag lesion is 60% stenosed.  Ost 1st Mrg lesion is 95% stenosed.  Ost 2nd Mrg lesion is 70% stenosed.  Hemodynamic findings consistent with moderate pulmonary hypertension. 1. CAD with 60% first diagonal, 95% ostial small OM1, 70% OM2, and 50% mid PDA 2. Moderate pulmonary HTN with mean PAP 37 mm Hg 3. Moderately elevated LV filling pressures with PCWP of 29 mm Hg     Antimicrobials:  none   Subjective: He is feeling tired. Needs water. Hemoptysis persist, not worse.    Objective: Vitals:   02/18/17 2100 02/18/17 2150 02/18/17 2318 02/19/17 0415  BP: 96/69   100/64  Pulse: (!) 102 (!) 102  (!) 102  Resp: (!) 27     Temp:  97.8 F (36.6 C) 98 F (36.7 C) 98.1 F (36.7 C)  TempSrc:  Axillary Oral Oral  SpO2: 92% 95%  93%  Weight:    73.6 kg (162 lb 4.8 oz)  Height:        Intake/Output Summary (Last 24 hours) at 02/19/2017 0730 Last data filed at 02/19/2017 0415 Gross per 24 hour  Intake 926.21 ml  Output 2675 ml  Net -1748.79 ml   Filed Weights   02/17/17 0500 02/18/17 0500 02/19/17 0415  Weight: 78.4 kg (172 lb 13.5 oz) 76.8 kg (169 lb 5 oz) 73.6 kg (162 lb 4.8 oz)    Examination:  General exam: Appears calm and comfortable  Respiratory system: normal respiratory effort, no wheezing, no crackles.  Cardiovascular  system: S1 & S2 heard, RRR. No JVD, murmurs, rubs, gallops or clicks. No pedal edema. Gastrointestinal system: Abdomen is nondistended, soft and nontender. No organomegaly or masses felt. Normal bowel sounds heard. Central nervous system: Alert and oriented. No focal neurological deficits. Extremities: Symmetric 5 x 5 power. Skin: No rashes, lesions or ulcers    Data Reviewed: I have personally reviewed following labs and imaging studies  CBC: Recent Labs  Lab 02/16/17 0250 02/16/17 0732 02/17/17 0425 02/18/17 0531 02/19/17 0334  WBC 19.1* 21.7* 20.4* 20.4* 23.7*  HGB 13.8 13.6 12.4* 11.8* 12.0*  HCT 39.1 39.7 36.5* 34.2* 35.1*  MCV 92.4 93.9 92.4 91.9 91.9  PLT 161 189 196 198 244   Basic Metabolic Panel: Recent Labs  Lab 02/15/17 0911 02/16/17 0250 02/17/17 0425 02/18/17 0531 02/19/17 0334  NA 133* 132* 133* 133* 135  K 3.8 3.9 3.9 3.3* 3.2*  CL 98* 98* 94* 92* 94*  CO2 24 26 27 26 29   GLUCOSE 114* 133* 109* 276* 121*  BUN 14 17 17 17 18   CREATININE 0.57* 0.60* 0.73 0.62 0.54*  CALCIUM 8.3* 8.5* 8.4* 7.8* 7.9*   GFR: Estimated Creatinine Clearance: 98.9 mL/min (A) (by C-G formula based on SCr of 0.54 mg/dL (L)). Liver Function Tests: No results for input(s): AST, ALT, ALKPHOS, BILITOT, PROT, ALBUMIN in the last 168 hours. No results for input(s): LIPASE, AMYLASE in the last 168 hours. No results for input(s): AMMONIA in the last 168 hours. Coagulation Profile: Recent Labs  Lab 02/16/17 0731  INR 1.81   Cardiac Enzymes: No results for input(s): CKTOTAL, CKMB, CKMBINDEX, TROPONINI in the last 168 hours. BNP (last 3 results) No results for input(s): PROBNP in the last 8760 hours. HbA1C: No results for input(s): HGBA1C in the last 72 hours. CBG: Recent Labs  Lab 02/16/17 1920 02/16/17 2331 02/17/17 0332 02/17/17 0712 02/18/17 0826  GLUCAP 120* 99 109* 105* 137*   Lipid Profile: No results for input(s): CHOL, HDL, LDLCALC, TRIG, CHOLHDL, LDLDIRECT in  the last 72 hours. Thyroid Function Tests: No results for input(s): TSH, T4TOTAL, FREET4, T3FREE, THYROIDAB in the last 72 hours. Anemia Panel: No results for input(s): VITAMINB12, FOLATE, FERRITIN, TIBC, IRON, RETICCTPCT in the last 72 hours. Sepsis Labs: No results for input(s): PROCALCITON, LATICACIDVEN in the last 168 hours.  Recent Results (from the past 240 hour(s))  MRSA PCR Screening     Status: Abnormal   Collection Time: 02/11/17  7:59 PM  Result Value Ref Range Status   MRSA by PCR POSITIVE (A) NEGATIVE Final    Comment:        The GeneXpert MRSA Assay (FDA approved for NASAL specimens only), is one component of a comprehensive MRSA colonization surveillance program. It is not intended to diagnose MRSA infection nor to guide or monitor treatment for MRSA infections. RESULT CALLED TO, READ BACK BY AND VERIFIED WITH: H DADAMO RN 02/12/17 119 JDW          Radiology Studies: No results found.      Scheduled Meds: . amiodarone  200 mg Oral BID  . apixaban  10 mg Oral BID   Followed by  . [START ON 02/24/2017] apixaban  5 mg Oral BID  . atorvastatin  40 mg Oral q1800  . digoxin  0.125 mg Oral Daily  . feeding supplement (ENSURE ENLIVE)  237 mL Oral QID  . folic acid  1 mg Oral Daily  . furosemide  40 mg Intravenous BID  . losartan  12.5 mg Oral Daily  . mometasone-formoterol  2 puff Inhalation BID  . multivitamin with minerals  1 tablet Oral Daily  . pantoprazole (PROTONIX) IV  40 mg Intravenous Q12H  . QUEtiapine  25 mg Oral QHS  . sodium chloride flush  10-40 mL Intracatheter Q12H  . sodium chloride flush  3 mL Intravenous Q12H  . thiamine  100 mg Oral Daily   Continuous Infusions: . milrinone 0.125 mcg/kg/min (02/19/17 0415)     LOS: 8 days    Time spent: 35 minutes.     Alba Cory, MD [facility] Pager [phone]  If 7PM-7AM, please contact night-coverage [email or web address] Password [email or web address] 02/19/2017, 7:30 AM

## 2017-02-19 NOTE — Progress Notes (Signed)
Pharmacy Antibiotic Note  Stephen Fleming is a 62 y.o. male admitted on 02/10/2017 with pneumonia.  Pharmacy has been consulted for vancomycin and cefepime dosing.  Found to have bilateral LE DVTs and submassive PE, now on apixaban. WBC continues to increase from 12.4 to 23.7 today (not on steroids). Afebrile overnight. Hasn't received any antibiotics yet this hospitalization.   Plan: Vancomycin 1500 mg  Vancomycin 750 mg IV every 8 hours.  Goal trough 15-20 mcg/mL.  Cefepime 2 gram every 8 hours Monitor renal fx, cx results, clinical picture, and VT as needed   Height: 5\' 10"  (177.8 cm) Weight: 162 lb 4.8 oz (73.6 kg) IBW/kg (Calculated) : 73  Temp (24hrs), Avg:97.4 F (36.3 C), Min:96.4 F (35.8 C), Max:98.1 F (36.7 C)  Recent Labs  Lab 02/15/17 0911  02/16/17 0250 02/16/17 0732 02/17/17 0425 02/18/17 0531 02/19/17 0334  WBC 19.3*   < > 19.1* 21.7* 20.4* 20.4* 23.7*  CREATININE 0.57*  --  0.60*  --  0.73 0.62 0.54*   < > = values in this interval not displayed.    Estimated Creatinine Clearance: 98.9 mL/min (A) (by C-G formula based on SCr of 0.54 mg/dL (L)).    No Known Allergies  Antimicrobials this admission: Cefepime 1/30 >>  Vancomycin 1/30 >>   Dose adjustments this admission: N/A  Microbiology results:  1/22 MRSA PCR: positive   Thank you for allowing pharmacy to be a part of this patient's care.  Girard Cooter, PharmD Clinical Pharmacist  Pager: [phone] Clinical Phone for 02/19/2017 until 3:30pm: [phone] If after 3:30pm, please call main pharmacy at [phone] 02/19/2017 1:19 PM

## 2017-02-20 ENCOUNTER — Encounter (HOSPITAL_COMMUNITY): Payer: Self-pay | Admitting: Radiology

## 2017-02-20 DIAGNOSIS — J90 Pleural effusion, not elsewhere classified: Secondary | ICD-10-CM

## 2017-02-20 LAB — GLUCOSE, CAPILLARY: Glucose-Capillary: 103 mg/dL — ABNORMAL HIGH (ref 65–99)

## 2017-02-20 LAB — BASIC METABOLIC PANEL
Anion gap: 8 (ref 5–15)
BUN: 17 mg/dL (ref 6–20)
CO2: 29 mmol/L (ref 22–32)
Calcium: 7.6 mg/dL — ABNORMAL LOW (ref 8.9–10.3)
Chloride: 95 mmol/L — ABNORMAL LOW (ref 101–111)
Creatinine, Ser: 0.53 mg/dL — ABNORMAL LOW (ref 0.61–1.24)
GFR calc Af Amer: >60 mL/min (ref >60)
GFR calc non Af Amer: >60 mL/min (ref >60)
GLUCOSE: 262 mg/dL — AB (ref 65–99)
POTASSIUM: 3.3 mmol/L — AB (ref 3.5–5.1)
Sodium: 132 mmol/L — ABNORMAL LOW (ref 135–145)

## 2017-02-20 LAB — CBC
HEMATOCRIT: 36.5 % — AB (ref 39.0–52.0)
HEMOGLOBIN: 12.5 g/dL — AB (ref 13.0–17.0)
MCH: 31.5 pg (ref 26.0–34.0)
MCHC: 34.2 g/dL (ref 30.0–36.0)
MCV: 91.9 fL (ref 78.0–100.0)
Platelets: 241 10*3/uL (ref 150–400)
RBC: 3.97 MIL/uL — AB (ref 4.22–5.81)
RDW: 15 % (ref 11.5–15.5)
WBC: 23.4 10*3/uL — ABNORMAL HIGH (ref 4.0–10.5)

## 2017-02-20 LAB — COOXEMETRY PANEL
Carboxyhemoglobin: 2.3 % — ABNORMAL HIGH (ref 0.5–1.5)
Methemoglobin: 0.7 % (ref 0.0–1.5)
O2 SAT: 57.8 %
Total hemoglobin: 12.3 g/dL (ref 12.0–16.0)

## 2017-02-20 MED ORDER — FUROSEMIDE 40 MG PO TABS
40.0000 mg | ORAL_TABLET | Freq: Every day | ORAL | Status: DC
Start: 2017-02-21 — End: 2017-02-27
  Administered 2017-02-21 – 2017-02-26 (×6): 40 mg via ORAL
  Filled 2017-02-20 (×6): qty 1

## 2017-02-20 MED ORDER — HEPARIN (PORCINE) IN NACL 100-0.45 UNIT/ML-% IJ SOLN
1300.0000 [IU]/h | INTRAMUSCULAR | Status: DC
  Administered 2017-02-20: 1300 [IU]/h via INTRAVENOUS
  Filled 2017-02-20: qty 250

## 2017-02-20 MED ORDER — POTASSIUM CHLORIDE CRYS ER 20 MEQ PO TBCR
40.0000 meq | EXTENDED_RELEASE_TABLET | Freq: Once | ORAL | Status: AC
  Administered 2017-02-20: 40 meq via ORAL
  Filled 2017-02-20: qty 2

## 2017-02-20 MED ORDER — SPIRONOLACTONE 25 MG PO TABS
25.0000 mg | ORAL_TABLET | Freq: Every day | ORAL | Status: DC
  Administered 2017-02-20 – 2017-02-26 (×7): 25 mg via ORAL
  Filled 2017-02-20 (×7): qty 1

## 2017-02-20 NOTE — Progress Notes (Addendum)
ANTICOAGULATION CONSULT NOTE Pharmacy Consult for heparin >> apixaban >>heparin infusion Indication: pulmonary embolus  No Known Allergies  Patient Measurements: Height: 5\' 10"  (177.8 cm) Weight: 164 lb 14.4 oz (74.8 kg) IBW/kg (Calculated) : 73 HEPARIN DW (KG): 77.2  Vital Signs: Temp: 97 F (36.1 C) (01/31 1130) Temp Source: Oral (01/31 1130) BP: 82/68 (01/31 1130) Pulse Rate: 93 (01/31 1130)  Labs: Recent Labs    02/18/17 0500  02/18/17 0531 02/19/17 0334 02/20/17 0500  HGB  --    < > 11.8* 12.0* 12.5*  HCT  --   --  34.2* 35.1* 36.5*  PLT  --   --  198 244 241  APTT  --   --  49*  --   --   HEPARINUNFRC >2.20*  --   --   --   --   CREATININE  --   --  0.62 0.54* 0.53*   < > = values in this interval not displayed.    Estimated Creatinine Clearance: 98.9 mL/min (A) (by C-G formula based on SCr of 0.53 mg/dL (L)).  Assessment: 40 yoM presents with PE/DVT initially started on IV heparin. Briefly transitioned to apixaban but back on IV heparin due to concern for GIB. Once stabilized, transitioned back to apixaban.   MD consulted yesterday for worsening infiltrate in LUL. Patient still having vigorous cough with some bloody secretions. Plan to transition back to heparin for potential CT guided drain to evaluate for hemothorax or infected parapneumonic effusion. Hgb stable at 12.5 and platelets WNL. No signs/symptoms of overt bleeding. Last dose of apixaban was on 1/31 at 0916.  Goal of Therapy:  Heparin level: 0.3-0.7 APTT level: 66-102 sec Monitor platelets by anticoagulation protocol: Yes   Plan:  Discontinue apixaban orders Start heparin infusion at 1300 units/hr on 1/31 at 2100 Obtain 6 hour heparin level and aPTT given recent apixaban dose Monitor daily HL and aPTT Monitor signs/symptoms of bleeding  Girard Cooter, PharmD Clinical Pharmacist  Pager: [phone] Clinical Phone for 02/20/2017 until 3:30pm: [phone] If after 3:30pm, please call main pharmacy  at [phone] 02/20/2017 1:12 PM

## 2017-02-20 NOTE — Discharge Instructions (Signed)
Information on my medicine - ELIQUIS® (apixaban) ° °This medication education was reviewed with me or my healthcare representative as part of my discharge preparation.  The pharmacist that spoke with me during my hospital stay was:  °Iyanla Eilers A Perkins, RPH ° °Why was Eliquis® prescribed for you? °Eliquis® was prescribed to treat blood clots that may have been found in the veins of your legs (deep vein thrombosis) or in your lungs (pulmonary embolism) and to reduce the risk of them occurring again. ° °What do You need to know about Eliquis® ? °The starting dose is 10 mg (two 5 mg tablets) taken TWICE daily for the FIRST SEVEN (7) DAYS, then the dose is reduced to ONE 5 mg tablet taken TWICE daily.  Eliquis® may be taken with or without food.  ° °Try to take the dose about the same time in the morning and in the evening. If you have difficulty swallowing the tablet whole please discuss with your pharmacist how to take the medication safely. ° °Take Eliquis® exactly as prescribed and DO NOT stop taking Eliquis® without talking to the doctor who prescribed the medication.  Stopping may increase your risk of developing a new blood clot.  Refill your prescription before you run out. ° °After discharge, you should have regular check-up appointments with your healthcare provider that is prescribing your Eliquis®. °   °What do you do if you miss a dose? °If a dose of ELIQUIS® is not taken at the scheduled time, take it as soon as possible on the same day and twice-daily administration should be resumed. The dose should not be doubled to make up for a missed dose. ° °Important Safety Information °A possible side effect of Eliquis® is bleeding. You should call your healthcare provider right away if you experience any of the following: °? Bleeding from an injury or your nose that does not stop. °? Unusual colored urine (red or dark brown) or unusual colored stools (red or black). °? Unusual bruising for unknown reasons. °? A  serious fall or if you hit your head (even if there is no bleeding). ° °Some medicines may interact with Eliquis® and might increase your risk of bleeding or clotting while on Eliquis®. To help avoid this, consult your healthcare provider or pharmacist prior to using any new prescription or non-prescription medications, including herbals, vitamins, non-steroidal anti-inflammatory drugs (NSAIDs) and supplements. ° °This website has more information on Eliquis® (apixaban): http://www.eliquis.com/eliquis/home ° °

## 2017-02-20 NOTE — Progress Notes (Signed)
Patient ID: Stephen Fleming, male   DOB: [birth year], 62 y.o.   MRN: [id number]     Advanced Heart Failure Rounding Note  HF Cardiology: Dr Shirlee Latch  Subjective:    Patient now off milrinone, co-ox 58%.  CVP 5.  Weak/fatigued.  Still occasional scant hemoptysis.   CT chest (1/30) with moderate-large loculated left effusion + dense LLL consolidation, ?pulmonary infarct.    Objective:   Weight Range: 164 lb 14.4 oz (74.8 kg) Body mass index is 23.66 kg/m.   Vital Signs:   Temp:  [96.3 F (35.7 C)-97.8 F (36.6 C)] 96.3 F (35.7 C) (01/31 0745) Pulse Rate:  [90-116] 90 (01/31 0745) Resp:  [11-36] 11 (01/31 0745) BP: (92-114)/(57-91) 92/59 (01/31 0745) SpO2:  [88 %-95 %] 88 % (01/31 0745) Weight:  [164 lb 14.4 oz (74.8 kg)] 164 lb 14.4 oz (74.8 kg) (01/31 0358) Last BM Date: 02/18/17  Weight change: Filed Weights   02/18/17 0500 02/19/17 0415 02/20/17 0358  Weight: 169 lb 5 oz (76.8 kg) 162 lb 4.8 oz (73.6 kg) 164 lb 14.4 oz (74.8 kg)    Intake/Output:   Intake/Output Summary (Last 24 hours) at 02/20/2017 0810 Last data filed at 02/20/2017 0403 Gross per 24 hour  Intake 1280 ml  Output 2350 ml  Net -1070 ml      Physical Exam  CVP 5  General: NAD Neck: No JVD, no thyromegaly or thyroid nodule.  Lungs: Decreased breath sounds on left.  CV: Nondisplaced PMI.  Heart regular S1/S2, no S3/S4, no murmur.  1+ edema 1/2 up lower legs bilaterally.   Abdomen: Soft, nontender, no hepatosplenomegaly, no distention.  Skin: Intact without lesions or rashes.  Neurologic: Alert and oriented x 3.  Psych: Normal affect. Extremities: No clubbing or cyanosis.  HEENT: Normal.     Telemetry  NSR 90s (personally reviewed)    EKG    No new tracings.    Labs    CBC Recent Labs    02/19/17 0334 02/20/17 0500  WBC 23.7* 23.4*  HGB 12.0* 12.5*  HCT 35.1* 36.5*  MCV 91.9 91.9  PLT 244 241   Basic Metabolic Panel Recent Labs    40/98/11 0334 02/20/17 0500  NA 135 132*  K  3.2* 3.3*  CL 94* 95*  CO2 29 29  GLUCOSE 121* 262*  BUN 18 17  CREATININE 0.54* 0.53*  CALCIUM 7.9* 7.6*   Liver Function Tests No results for input(s): AST, ALT, ALKPHOS, BILITOT, PROT, ALBUMIN in the last 72 hours. No results for input(s): LIPASE, AMYLASE in the last 72 hours. Cardiac Enzymes No results for input(s): CKTOTAL, CKMB, CKMBINDEX, TROPONINI in the last 72 hours.  BNP: BNP (last 3 results) Recent Labs    12/04/16 0030  BNP 1,027.4*    ProBNP (last 3 results) No results for input(s): PROBNP in the last 8760 hours.   D-Dimer No results for input(s): DDIMER in the last 72 hours. Hemoglobin A1C No results for input(s): HGBA1C in the last 72 hours. Fasting Lipid Panel No results for input(s): CHOL, HDL, LDLCALC, TRIG, CHOLHDL, LDLDIRECT in the last 72 hours. Thyroid Function Tests No results for input(s): TSH, T4TOTAL, T3FREE, THYROIDAB in the last 72 hours.  Invalid input(s): FREET3  Other results:   Imaging    Ct Chest Wo Contrast  Result Date: 02/19/2017 CLINICAL DATA:  Acute respiratory illness.  Pleural effusion. EXAM: CT CHEST WITHOUT CONTRAST TECHNIQUE: Multidetector CT imaging of the chest was performed following the standard protocol without IV contrast. COMPARISON:  02/11/2017 FINDINGS: Cardiovascular: There is moderate cardiac enlargement. Aortic atherosclerosis. Calcification in the LAD and left circumflex and RCA coronary arteries noted. Mediastinum/Nodes: The trachea appears patent and is midline. Normal appearance of the esophagus. Multiple prominent, non pathologically enlarged mediastinal lymph nodes identified. No axillary or supraclavicular adenopathy. Lungs/Pleura: There is a moderate to large loculated left pleural effusion identified which is new from the previous exam. There is dense airspace consolidation involving the entire left lower lobe and the posterior left upper lobe. Mild subpleural consolidation within the posterior right middle  lobe noted, image 17 of series 7. Upper Abdomen: No acute abnormality. Musculoskeletal: Degenerative disc disease identified within the thoracic spine. No aggressive lytic or sclerotic bone lesions. IMPRESSION: 1. Moderate to large loculated left pleural effusion. 2. Dense airspace consolidation involves the entire left lower lobe and posterior left upper lobe which may be the sequelae of pulmonary infarct and/or infection. 3. Mild subpleural consolidation in the posterior right middle lobe. 4. Aortic Atherosclerosis (ICD10-I70.0). Three vessel coronary artery calcifications noted. Electronically Signed   By: Signa Kell M.D.   On: 02/19/2017 15:32   Dg Chest Port 1 View  Result Date: 02/19/2017 CLINICAL DATA:  Leukocytosis, cough and congestion. EXAM: PORTABLE CHEST 1 VIEW COMPARISON:  CT chest 02/11/2017 and chest radiograph 02/10/2017. FINDINGS: Trachea is midline. Heart is enlarged. Right PICC tip projects over the low SVC. Marked increase in airspace consolidation in the left upper and left lower lobes, with a left pleural effusion. Right lung is grossly clear. IMPRESSION: 1. Marked worsening in left upper and left lower lobe consolidation, worrisome for pneumonia. Combination of pneumonia and infarct is also considered, given recent diagnosis of pulmonary emboli. 2. Left pleural effusion. Electronically Signed   By: Leanna Battles M.D.   On: 02/19/2017 10:03     Medications:     Scheduled Medications: . amiodarone  200 mg Oral BID  . apixaban  10 mg Oral BID   Followed by  . [START ON 02/24/2017] apixaban  5 mg Oral BID  . atorvastatin  40 mg Oral q1800  . digoxin  0.125 mg Oral Daily  . feeding supplement (ENSURE ENLIVE)  237 mL Oral QID  . folic acid  1 mg Oral Daily  . [START ON 02/21/2017] furosemide  40 mg Oral Daily  . losartan  12.5 mg Oral Daily  . mometasone-formoterol  2 puff Inhalation BID  . multivitamin with minerals  1 tablet Oral Daily  . pantoprazole (PROTONIX) IV  40 mg  Intravenous Q12H  . potassium chloride  40 mEq Oral Once  . QUEtiapine  25 mg Oral QHS  . sodium chloride flush  10-40 mL Intracatheter Q12H  . sodium chloride flush  3 mL Intravenous Q12H  . spironolactone  25 mg Oral Daily  . thiamine  100 mg Oral Daily    Infusions: . ceFEPime (MAXIPIME) IV Stopped (02/20/17 2197)  . vancomycin 750 mg (02/20/17 0635)    PRN Medications: acetaminophen **OR** acetaminophen, albuterol, diazepam, ondansetron **OR** ondansetron (ZOFRAN) IV, sodium chloride flush, traMADol    Patient Profile   Stephen Fleming is a 82 year old with history of  Combined systolic/diastoic heart failure, CAD (cath in 11/2016 showing60% first diagonal, 95% ostial small OM1, 70% OM2, and 50% mid PDA--> medical management recommended, moderate Stephen, HTN, OSA intolerant CPAP.   Admitted with bilateral leg pain and dyspnea. Acute DVT/PE  Assessment/Plan   1. Bilateral DVTs with PE and pulmonary infarction: Did not have thrombolysis.  Thrombus in  transit seen in RA on echo. Still with scant hemoptysis.   CT chest concerning for pulmonary infarction on left as well as left loculated effusion.   - on apixaban 10 mg twice a day 2. Acute on chronic systolic CHF: Nonischemic cardiomyopathy, out of proportion to degree of CAD.  May be related to prior heavy ETOH.  Echo (1/19) with moderate LV dilation, EF 10-15%, moderately decreased RV systolic function, D-shaped interventricular septum suggestive of RV pressure/volume overload, PASP 46 mmHg.  Low output HF by RHC in 11/18 (CI 1.65, mean PCWP 29).  He was initially on milrinone, now off with co-ox 58% this morning. CVP 5 today. - Continue digoxin.  - Can decrease Lasix to 40 mg daily.  - Continue current dose of losartan and increase spironolactone to 25 mg daily.  - Off ivabradine with paroxysmal atrial fibrillation.  - Noted to have wide LBBB => CRT may be beneficial down the road.  - Can get cardiac MRI this admission prior to  discharge given cardiomyopathy of uncertain etiology.  3. CAD: Patient had extensive CAD on 11/18 cath but primarily nonobstructive.  Does not explain cardiomyopathy. No chest pain.  - Continue statin.  4. Suspected atrial fibrillation with RVR: Maintaining NSR on amiodarone.  - Continue amiodarone for now, will stop eventually but would give time to recovery from large PE.  - on apixaban.  5. Heme+ stool: Stable hgb. Possibly due to swallowing hemoptysis.  6. ID: WBCs 23, cannot rule out PNA/HCAP, covering with vancomycin/cefepime.  7. Left pleural effusion: Loculated, moderate-large.  Pulmonary following, ?amenable to thoracentesis.  8. Severe Deconditioning: Will need SNF  [name], MD  02/20/2017 8:10 AM

## 2017-02-20 NOTE — Progress Notes (Signed)
Chief Complaint: Patient was seen in consultation today for left chest drain at the request of Dr. Kendrick Fries  Referring Physician(s): Dr. Kendrick Fries  Supervising Physician: Gilmer Mor  Patient Status: [facility] - In-pt  History of Present Illness: Stephen Fleming is a 62 y.o. male admitted with PE now with progressive pneumonia. He had CT showing large superior-anterior loculated left pleural effusion. Pulmonary/Critical care service has requested image guided drain/tube placement. Chart, imaging, meds, labs reviewed. Elevated WBC but afebrile, no sepsis yet. Has been on Eliquis for PE, last dose this am, but has been discontinued and he will be started on IV heparin gtt.   Past Medical History:  Diagnosis Date  . Arthritis    "probably in all my joints" (12/03/2016)  . Asthma   . CAD (coronary artery disease)    a. LHC 12/04/16: 30% RCA, 50% RPDA, 25% mid LAD, 95% 1st diag, 70% 2nd diag.  . CHF (congestive heart failure), NYHA class II, chronic, combined (HCC)    12/04/16: Echo EF 20-25%, Gr 3 DD, PA peak pressure 62 mmgHg, RHC: PCPW 29 mmHg  . Eczema   . Gout    "I take RX for it" (12/03/2016)  . Heart murmur   . Hypertension   . Mitral regurgitation    12/04/16: moderate by echo  . OSA (obstructive sleep apnea)    "never could tolerate the mask" (12/03/2016)  . Pulmonary hypertension (HCC)    12/04/16: RHC: Mod PHTN, PAP 37 mmHg    Past Surgical History:  Procedure Laterality Date  . APPENDECTOMY  ~  1967  . BACK SURGERY    . COLONOSCOPY  ~ 2008  . ESOPHAGOGASTRODUODENOSCOPY  ~ 2008  . INGUINAL HERNIA REPAIR Right ~ 1976  . LUMBAR DISC SURGERY  1991; 1993   "[name] did them both"  . RIGHT/LEFT HEART CATH AND CORONARY ANGIOGRAPHY N/A 12/04/2016   Procedure: RIGHT/LEFT HEART CATH AND CORONARY ANGIOGRAPHY;  Surgeon: [place], Peter M, MD;  Location: [facility] INVASIVE CV LAB;  Service: Cardiovascular;  Laterality: N/A;    Allergies: Patient has no known  allergies.  Medications:   Current Facility-Administered Medications:  .  acetaminophen (TYLENOL) tablet 650 mg, 650 mg, Oral, Q6H PRN, 650 mg at 02/14/17 0145 **OR** acetaminophen (TYLENOL) suppository 650 mg, 650 mg, Rectal, Q6H PRN, Haydee Salter, MD .  albuterol (PROVENTIL) (2.5 MG/3ML) 0.083% nebulizer solution 2.5 mg, 2.5 mg, Nebulization, Q2H PRN, Haydee Salter, MD .  amiodarone (PACERONE) tablet 200 mg, 200 mg, Oral, BID, Laurey Morale, MD, 200 mg at 02/20/17 0916 .  atorvastatin (LIPITOR) tablet 40 mg, 40 mg, Oral, q1800, Laurey Morale, MD, 40 mg at 02/19/17 1731 .  ceFEPIme (MAXIPIME) 2 g in dextrose 5 % 50 mL IVPB, 2 g, Intravenous, Q8H, Regalado, Belkys A, MD, Stopped at 02/20/17 1407 .  diazepam (VALIUM) tablet 2 mg, 2 mg, Oral, BID PRN, John Giovanni, MD, 2 mg at 02/19/17 1837 .  digoxin (LANOXIN) tablet 0.125 mg, 0.125 mg, Oral, Daily, Clegg, Amy D, NP, 0.125 mg at 02/20/17 0916 .  feeding supplement (ENSURE ENLIVE) (ENSURE ENLIVE) liquid 237 mL, 237 mL, Oral, QID, Hazeline Junker B, MD, 237 mL at 02/20/17 1245 .  folic acid (FOLVITE) tablet 1 mg, 1 mg, Oral, Daily, Hammonds, Curt Jews, MD, 1 mg at 02/20/17 0916 .  [START ON 02/21/2017] furosemide (LASIX) tablet 40 mg, 40 mg, Oral, Daily, Laurey Morale, MD .  heparin ADULT infusion 100 units/mL (25000 units/220mL sodium chloride 0.45%), 1,300  Units/hr, Intravenous, Continuous, Regalado, Belkys A, MD .  losartan (COZAAR) tablet 12.5 mg, 12.5 mg, Oral, Daily, Laurey Morale, MD, 12.5 mg at 02/20/17 0916 .  mometasone-formoterol (DULERA) 200-5 MCG/ACT inhaler 2 puff, 2 puff, Inhalation, BID, Haydee Salter, MD, 2 puff at 02/20/17 [phone] .  multivitamin with minerals tablet 1 tablet, 1 tablet, Oral, Daily, Hammonds, Curt Jews, MD, 1 tablet at 02/20/17 [phone] .  ondansetron (ZOFRAN) tablet 4 mg, 4 mg, Oral, Q6H PRN **OR** ondansetron (ZOFRAN) injection 4 mg, 4 mg, Intravenous, Q6H PRN, Haydee Salter, MD .   pantoprazole (PROTONIX) injection 40 mg, 40 mg, Intravenous, Q12H, Tyrone Nine, MD, 40 mg at 02/20/17 0914 .  QUEtiapine (SEROQUEL) tablet 25 mg, 25 mg, Oral, QHS, Hammonds, Curt Jews, MD, 25 mg at 02/19/17 2300 .  sodium chloride flush (NS) 0.9 % injection 10-40 mL, 10-40 mL, Intracatheter, Q12H, Hammonds, Curt Jews, MD, 10 mL at 02/20/17 0918 .  sodium chloride flush (NS) 0.9 % injection 10-40 mL, 10-40 mL, Intracatheter, PRN, Hammonds, Curt Jews, MD .  sodium chloride flush (NS) 0.9 % injection 3 mL, 3 mL, Intravenous, Q12H, Haydee Salter, MD, 3 mL at 02/20/17 5409 .  spironolactone (ALDACTONE) tablet 25 mg, 25 mg, Oral, Daily, Laurey Morale, MD, 25 mg at 02/20/17 0915 .  thiamine (VITAMIN B-1) tablet 100 mg, 100 mg, Oral, Daily, Hammonds, Curt Jews, MD, 100 mg at 02/20/17 0916 .  traMADol (ULTRAM) tablet 50 mg, 50 mg, Oral, Q6H PRN, Tyrone Nine, MD, 50 mg at 02/19/17 1838 .  [COMPLETED] vancomycin (VANCOCIN) 1,500 mg in sodium chloride 0.9 % 500 mL IVPB, 1,500 mg, Intravenous, Once, Stopped at 02/19/17 1731 **FOLLOWED BY** vancomycin (VANCOCIN) IVPB 750 mg/150 ml premix, 750 mg, Intravenous, Q8H, Regalado, Belkys A, MD, Stopped at 02/20/17 1437   Family History  Problem Relation Age of Onset  . Heart attack Mother   . CAD Father   . Heart attack Brother     Social History   Socioeconomic History  . Marital status: Married    Spouse name: None  . Number of children: None  . Years of education: None  . Highest education level: None  Social Needs  . Financial resource strain: None  . Food insecurity - worry: None  . Food insecurity - inability: None  . Transportation needs - medical: None  . Transportation needs - non-medical: None  Occupational History  . None  Tobacco Use  . Smoking status: Never Smoker  . Smokeless tobacco: Never Used  Substance and Sexual Activity  . Alcohol use: Yes    Alcohol/week: 12.0 oz    Types: 20 Cans of beer per week  . Drug use:  No  . Sexual activity: Not Currently  Other Topics Concern  . None  Social History Narrative  . None     Review of Systems: A 12 point ROS discussed and pertinent positives are indicated in the HPI above.  All other systems are negative.  Review of Systems  Vital Signs: BP (!) 82/68 (BP Location: Left Arm)   Pulse 93   Temp (!) 97 F (36.1 C) (Oral)   Resp (!) 22   Ht 5\' 10"  (1.778 m)   Wt 164 lb 14.4 oz (74.8 kg)   SpO2 93%   BMI 23.66 kg/m   Physical Exam  Constitutional: He is oriented to person, place, and time. He appears well-developed. He does not appear ill. No distress.  HENT:  Head: Normocephalic.  Mouth/Throat: Oropharynx is clear and moist.  Neck: No JVD present. No tracheal deviation present.  Cardiovascular: Normal rate, regular rhythm and normal heart sounds.  Pulmonary/Chest: Effort normal.  Diminished (L)BS  Neurological: He is alert and oriented to person, place, and time.  Skin: Skin is warm and dry.  Psychiatric: He has a normal mood and affect.     Imaging: Dg Chest 2 View  Result Date: 02/10/2017 CLINICAL DATA:  Swelling and pain in legs. Congestive heart failure. EXAM: CHEST  2 VIEW COMPARISON:  Two-view chest x-ray 12/03/2016. FINDINGS: Heart is enlarged. There is no edema or effusion. Left lower lobe airspace disease is noted. The visualized soft tissues and bony thorax are unremarkable. IMPRESSION: 1. Cardiomegaly without failure. 2. Ill-defined left lower lobe airspace disease is concerning for early pneumonia. Electronically Signed   By: Marin Roberts M.D.   On: 02/10/2017 17:02   Ct Chest Wo Contrast  Result Date: 02/19/2017 CLINICAL DATA:  Acute respiratory illness.  Pleural effusion. EXAM: CT CHEST WITHOUT CONTRAST TECHNIQUE: Multidetector CT imaging of the chest was performed following the standard protocol without IV contrast. COMPARISON:  02/11/2017 FINDINGS: Cardiovascular: There is moderate cardiac enlargement. Aortic  atherosclerosis. Calcification in the LAD and left circumflex and RCA coronary arteries noted. Mediastinum/Nodes: The trachea appears patent and is midline. Normal appearance of the esophagus. Multiple prominent, non pathologically enlarged mediastinal lymph nodes identified. No axillary or supraclavicular adenopathy. Lungs/Pleura: There is a moderate to large loculated left pleural effusion identified which is new from the previous exam. There is dense airspace consolidation involving the entire left lower lobe and the posterior left upper lobe. Mild subpleural consolidation within the posterior right middle lobe noted, image 17 of series 7. Upper Abdomen: No acute abnormality. Musculoskeletal: Degenerative disc disease identified within the thoracic spine. No aggressive lytic or sclerotic bone lesions. IMPRESSION: 1. Moderate to large loculated left pleural effusion. 2. Dense airspace consolidation involves the entire left lower lobe and posterior left upper lobe which may be the sequelae of pulmonary infarct and/or infection. 3. Mild subpleural consolidation in the posterior right middle lobe. 4. Aortic Atherosclerosis (ICD10-I70.0). Three vessel coronary artery calcifications noted. Electronically Signed   By: Signa Kell M.D.   On: 02/19/2017 15:32   Ct Angio Chest Pe W Or Wo Contrast  Addendum Date: 02/11/2017   ADDENDUM REPORT: 02/11/2017 15:54 ADDENDUM: I discussed the findings of this study with Dr. Arbutus Leas at 3:52 p.m. Electronically Signed   By: Dwyane Dee M.D.   On: 02/11/2017 15:54   Result Date: 02/11/2017 CLINICAL DATA:  Short of breath, coughing up blood, evaluate for pulmonary embolism EXAM: CT ANGIOGRAPHY CHEST WITH CONTRAST TECHNIQUE: Multidetector CT imaging of the chest was performed using the standard protocol during bolus administration of intravenous contrast. Multiplanar CT image reconstructions and MIPs were obtained to evaluate the vascular anatomy. CONTRAST:  ISOVUE-370  IOPAMIDOL (ISOVUE-370) INJECTION 76% COMPARISON:  Chest x-ray of 02/10/2017 FINDINGS: Cardiovascular: The pulmonary arteries are well opacified. There are extensive bilateral pulmonary emboli. There is a large embolus almost completely occluding the distal left main pulmonary artery extending into the branches to the upper lobe, lower lobe, and lingula. On the right a large embolus also is partially occlusive within the distal portion of the main right pulmonary artery. This thrombus also extends into the right upper lobe, right middle lobe, and right lower lobe. The RV/LV ratio is approximately 1 which implies the patient is at risk for increased morbidity and mortality. The LV is  difficult to evaluate due to poor opacification with contrast media. There is moderate cardiomegaly present. No pericardial effusion is seen. Coronary artery calcifications are noted diffusely. Mediastinum/Nodes: No mediastinal or hilar adenopathy is seen. The thyroid gland is unremarkable. No hiatal hernia is seen. Lungs/Pleura: On lung window images there is parenchymal infiltrate primarily peripherally within much of the left lower lobe and to a lesser degree within the posterosuperior aspect of the left upper lobe. Although these could represent foci of pneumonia, developing pulmonary infarction would also be a consideration. The right lung is clear. No pleural effusion is seen. Upper Abdomen: The portion of the upper abdomen that is visualized is unremarkable. There may be some atrophy of the left kidney with apparent compensatory hypertrophy of the right kidney. Also there may be a cyst emanating from the mid right kidney peripherally which is difficult to assess on this unenhanced study. Musculoskeletal: The thoracic vertebrae are in normal alignment. No compression deformity is seen. There are degenerative changes in the mid to lower thoracic spine. Review of the MIP images confirms the above findings. IMPRESSION: 1. Extensive  partially occlusive bilateral pulmonary emboli with RV/LV ratio of 1.0 implying increased morbidity mortality. Positive for acute PE with CT evidence of right heart strain (RV/LV Ratio = 1.0) consistent with at least submassive (intermediate risk) PE. The presence of right heart strain has been associated with an increased risk of morbidity and mortality. Please activate Code PE by paging [phone]. 2. Parenchymal opacity within much of the left lower lobe and a small portion of the posterosuperior left upper lobe could represent pneumonia or developing infarction. No pleural effusion. 3. Apparent partial atrophy of the left kidney with some compensatory hypertrophy of the right kidney. Low-attenuation structure emanates from the periphery the right mid kidney difficult to assess on this study. Consider ultrasound to assess further. Electronically Signed: By: Dwyane Dee M.D. On: 02/11/2017 15:47   US Venous Img Lower Bilateral  Result Date: 02/10/2017 CLINICAL DATA:  Bilateral lower extremity pain and swelling. EXAM: BILATERAL LOWER EXTREMITY VENOUS DOPPLER ULTRASOUND TECHNIQUE: Gray-scale sonography with compression, as well as color and duplex ultrasound, were performed to evaluate the deep venous system from the level of the common femoral vein through the popliteal and proximal calf veins. COMPARISON:  None FINDINGS: On the right, there is occlusive thrombus in the popliteal vein. There thrombus in the femoral vein. This is occlusive in the mid and distal segments. The thrombus extends to its confluence with the deep femoral vein. There is gastrocnemius DVT. The posterior tibial vein is occluded and noncompressible. Peroneal veins are not well seen. There is subcutaneous edema. Visualized segments of the saphenous venous system normal in caliber and compressibility. On the left, there is thrombus in the popliteal vein which is occlusive and noncompressible. There is posterior tibial vein DVT, occlusive  and noncompressible. Visualized segments of the saphenous venous system normal in caliber and compressibility. IMPRESSION: 1. Occlusive DVT in LEFT posterior tibial and popliteal veins. 2. Occlusive DVT in RIGHT posterior tibial, popliteal, and femoral veins. Electronically Signed   By: Corlis Leak M.D.   On: 02/10/2017 18:15   Dg Chest Port 1 View  Result Date: 02/19/2017 CLINICAL DATA:  Leukocytosis, cough and congestion. EXAM: PORTABLE CHEST 1 VIEW COMPARISON:  CT chest 02/11/2017 and chest radiograph 02/10/2017. FINDINGS: Trachea is midline. Heart is enlarged. Right PICC tip projects over the low SVC. Marked increase in airspace consolidation in the left upper and left lower lobes, with a left  pleural effusion. Right lung is grossly clear. IMPRESSION: 1. Marked worsening in left upper and left lower lobe consolidation, worrisome for pneumonia. Combination of pneumonia and infarct is also considered, given recent diagnosis of pulmonary emboli. 2. Left pleural effusion. Electronically Signed   By: Leanna Battles M.D.   On: 02/19/2017 10:03    Labs:  CBC: Recent Labs    02/17/17 0425 02/18/17 0531 02/19/17 0334 02/20/17 0500  WBC 20.4* 20.4* 23.7* 23.4*  HGB 12.4* 11.8* 12.0* 12.5*  HCT 36.5* 34.2* 35.1* 36.5*  PLT 196 198 244 241    COAGS: Recent Labs    12/04/16 0918  02/10/17 1852 02/16/17 0731 02/16/17 1855 02/16/17 2100 02/17/17 0425 02/18/17 0531  INR 1.13  --  1.43 1.81  --   --   --   --   APTT  --    < > 35 111* >200* 78* 95* 49*   < > = values in this interval not displayed.    BMP: Recent Labs    02/17/17 0425 02/18/17 0531 02/19/17 0334 02/20/17 0500  NA 133* 133* 135 132*  K 3.9 3.3* 3.2* 3.3*  CL 94* 92* 94* 95*  CO2 27 26 29 29   GLUCOSE 109* 276* 121* 262*  BUN 17 17 18 17   CALCIUM 8.4* 7.8* 7.9* 7.6*  CREATININE 0.73 0.62 0.54* 0.53*  GFRNONAA >60 >60 >60 >60  GFRAA >60 >60 >60 >60    LIVER FUNCTION TESTS: Recent Labs    02/10/17 1704  02/12/17 0327  BILITOT 3.3* 2.8*  AST 59* 40  ALT 45 35  ALKPHOS 70 63  PROT 6.8 6.2*  ALBUMIN 3.8 3.3*  3.3*    TUMOR MARKERS: No results for input(s): AFPTM, CEA, CA199, CHROMGRNA in the last 8760 hours.  Assessment and Plan: PE (L)PNA with complex appearing loculated effusion Plan for CT guided drainage Will keep NPO p MN for sedation. Cont holding Eliquis. Ok to start hep gtt. IR will advise to hold based on procedure time tomorrow Risks and benefits discussed with the patient including bleeding, infection, damage to adjacent structures, bowel perforation/fistula connection, and sepsis. All of the patient's questions were answered, patient is agreeable to proceed. Consent signed and in chart.     Thank you for this interesting consult.  I greatly enjoyed meeting Stephen Fleming and look forward to participating in their care.  A copy of this report was sent to the requesting provider on this date.  Electronically Signed: Brayton El, PA-C 02/20/2017, 2:24 PM   I spent a total of 20 minutes in face to face in clinical consultation, greater than 50% of which was counseling/coordinating care for left chest drain

## 2017-02-20 NOTE — Progress Notes (Signed)
[facility] Attending:  Summary: 63 y/o male with systolic heart failure admitted with a PE on 1/21  S: We were re-consulted yesterday for a worsening infiltrate in the left upper lobe He still has a vigorous cough but is on room air  O:  Vitals:   02/20/17 1000 02/20/17 1130  BP: 97/83 (!) 82/68  Pulse: 95 93  Resp: (!) 21 (!) 22  Temp:  (!) 97 F (36.1 C)  SpO2: 98% 93%   RA  General:  Resting comfortably in bed HENT: NCAT OP clear PULM: diminished breath sounds on the left B, normal effort CV: RRR, no mgr GI: BS+, soft, nontender MSK: normal bulk and tone Neuro: awake, alert, no distress, MAEW  CT chest images reviewed: infiltrate Left lower lobe, small effusion left lower lobe, presumably large loculated left upper lobe effusion  Bedside ultrasound of the left chest by me on 1/31: there is a fluid collection in the superior/anterior aspect of the left hemothorax and a very small effusion near the left lower lobe  Impression/plan:  PE: will change eliquis to heparin  Infiltrate: suspect infarct, possible pneumonia, agree with broad spectrum antibiotics for now but you could probably stop them if afebrile in next 24 -48 hours  Effusion on the left: the left effusion is anterior and superior and doesn't appear to communicate with the left lower lobe effusion; I think the best approach here is to perform a CT guided drain of the left upper lobe and send for the usual studies to evaluate for hemothorax or infected parapneumonic effusion; he will need to be off of eliquis for this, will change to heparin.  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  Heber Menno, MD [place] [facility] Pager: [phone] Cell: [phone] After 3pm or if no response, call [phone]

## 2017-02-20 NOTE — Evaluation (Signed)
Occupational Therapy Evaluation Patient Details Name: Stephen Fleming MRN: [id number] DOB: July 28, 1955 Today's Date: 02/20/2017    History of Present Illness Pt is a 62 y.o. male with PMH of combined systolic/diastoic HF, CAD, moderate MR, HTN, OSA (intolerant CPAP), admitted 02/10/17 with bilateral leg pain and dyspnea. Found to have acute DVT/PE.   Clinical Impression   PTA Pt was starting to require assist for ADL and use of RW for mobility. Pt is currently max A for LB ADL and mod A for BSC transfers. Pt requires cues for safety and sequencing. Pt fatigues very quickly and limits ability - Pt will benefit from skilled OT in the acute setting and afterwards at the SNF level to maximize safety and independence in ADL and functional transfers. Next session to focus on energy conservation for ADL and increasing OOB tolerance.     Follow Up Recommendations  SNF    Equipment Recommendations  Other (comment)(defer to next venue)    Recommendations for Other Services       Precautions / Restrictions Precautions Precautions: Fall Restrictions Weight Bearing Restrictions: No      Mobility Bed Mobility Overal bed mobility: Needs Assistance Bed Mobility: Supine to Sit;Sit to Supine     Supine to sit: Min guard;HOB elevated Sit to supine: Min guard   General bed mobility comments: min guard for safety, Pt refused to sit OOB in recliner "I need to take a nap"  Transfers Overall transfer level: Needs assistance Equipment used: 1 person hand held assist Transfers: Sit to/from [employer] Sit to Stand: Min assist Stand pivot transfers: Mod assist       General transfer comment: vc for safe hand placement, assist for power up and steady, Pt refused RW due to urgency    Balance Overall balance assessment: Needs assistance Sitting-balance support: No upper extremity supported;Feet supported Sitting balance-Leahy Scale: Fair Sitting balance - Comments: sitting EOB with  no back support   Standing balance support: Bilateral upper extremity supported;During functional activity Standing balance-Leahy Scale: Poor Standing balance comment: relies on UE support, reaching outside center of balance unsafely to lean on furniture                           ADL either performed or assessed with clinical judgement   ADL Overall ADL's : Needs assistance/impaired Eating/Feeding: Set up;Sitting;Bed level   Grooming: Wash/dry hands;Wash/dry face;Set up;Bed level   Upper Body Bathing: Moderate assistance;Bed level   Lower Body Bathing: Maximal assistance;Bed level   Upper Body Dressing : Moderate assistance   Lower Body Dressing: Total assistance Lower Body Dressing Details (indicate cue type and reason): Pt unable to don socks, unable to cross feet to knees Toilet Transfer: Moderate assistance;Stand-pivot;BSC Toilet Transfer Details (indicate cue type and reason): 1 HHA Toileting- Clothing Manipulation and Hygiene: Moderate assistance;Sit to/from stand       Functional mobility during ADLs: (SPT only this session) General ADL Comments: Pt is self-limiting this session, and requires cues for safety and sequencing. Pt also very particular "I have to re-train every person who comes in this room to get things the way I like it"     Vision         Perception     Praxis      Pertinent Vitals/Pain Pain Assessment: 0-10 Pain Score: 7  Pain Location: sudden spike in L shoulder Pain Descriptors / Indicators: Sharp;Shooting Pain Intervention(s): Monitored during session;Repositioned     Hand Dominance  Extremity/Trunk Assessment Upper Extremity Assessment Upper Extremity Assessment: Generalized weakness;LUE deficits/detail LUE Deficits / Details: at baseline "This shoulder hurts me" limited forward flexion above 60 degrees LUE: Unable to fully assess due to pain   Lower Extremity Assessment Lower Extremity Assessment: Defer to PT  evaluation   Cervical / Trunk Assessment Cervical / Trunk Assessment: Kyphotic   Communication Communication Communication: No difficulties   Cognition Arousal/Alertness: Awake/alert Behavior During Therapy: Flat affect Overall Cognitive Status: No family/caregiver present to determine baseline cognitive functioning Area of Impairment: Attention;Following commands;Problem solving                   Current Attention Level: Selective   Following Commands: Follows one step commands with increased time     Problem Solving: Requires verbal cues General Comments: Pt is very particular, increased time to follow cues, requires cues for problem solving during session   General Comments       Exercises     Shoulder Instructions      Home Living Family/patient expects to be discharged to:: Skilled nursing facility Living Arrangements: Spouse/significant other;Other relatives(grandchild) Available Help at Discharge: Family;Available PRN/intermittently(wife unable to physically asssist; grandchild in school) Type of Home: House Home Access: Level entry     Home Layout: Multi-level Alternate Level Stairs-Number of Steps: 1-2 Alternate Level Stairs-Rails: None           Home Equipment: Walker - 2 wheels          Prior Functioning/Environment Level of Independence: Needs assistance  Gait / Transfers Assistance Needed: Pt states he used RW last few weeks.  ADL's / Homemaking Assistance Needed: been struggling with ADL - wife unable to assist            OT Problem List: Decreased strength;Decreased activity tolerance;Impaired balance (sitting and/or standing);Decreased cognition;Decreased knowledge of use of DME or AE;Decreased safety awareness;Impaired UE functional use;Pain      OT Treatment/Interventions: Self-care/ADL training;Therapeutic exercise;Energy conservation;DME and/or AE instruction;Manual therapy;Therapeutic activities;Balance training;Patient/family  education    OT Goals(Current goals can be found in the care plan section) Acute Rehab OT Goals Patient Stated Goal: get better OT Goal Formulation: With patient Time For Goal Achievement: 03/06/17 Potential to Achieve Goals: Fair ADL Goals Pt Will Perform Grooming: with supervision;standing Pt Will Transfer to Toilet: with supervision;ambulating Pt Will Perform Toileting - Clothing Manipulation and hygiene: with supervision;sit to/from stand Additional ADL Goal #1: Pt will recall 3 energy conservation strategies for ADL with 2 or less verbal cues  OT Frequency: Min 2X/week   Barriers to D/C: Decreased caregiver support  Pt's wife unable to physically assist him       Co-evaluation              AM-PAC PT "6 Clicks" Daily Activity     Outcome Measure Help from another person eating meals?: None Help from another person taking care of personal grooming?: A Little(in seated) Help from another person toileting, which includes using toliet, bedpan, or urinal?: A Lot Help from another person bathing (including washing, rinsing, drying)?: A Lot Help from another person to put on and taking off regular upper body clothing?: A Lot Help from another person to put on and taking off regular lower body clothing?: A Lot 6 Click Score: 15   End of Session Equipment Utilized During Treatment: Gait belt Nurse Communication: Mobility status;Other (comment)(new cup of H2O, urine in bathroom container for I&O)  Activity Tolerance: Patient limited by fatigue Patient left: in bed;with call bell/phone  within reach;Other (comment)(pharmacist in room)  OT Visit Diagnosis: Unsteadiness on feet (R26.81);Other abnormalities of gait and mobility (R26.89);Muscle weakness (generalized) (M62.81);Other symptoms and signs involving cognitive function;Adult, failure to thrive (R62.7)                Time: 1010-1040 OT Time Calculation (min): 30 min Charges:  OT General Charges $OT Visit: 1 Visit OT  Evaluation $OT Eval Moderate Complexity: 1 Mod OT Treatments $Self Care/Home Management : 8-22 mins G-Codes:     Sherryl Manges OTR/L [phone]  Evern Bio Billijo Dilling 02/20/2017, 12:17 PM

## 2017-02-20 NOTE — Progress Notes (Signed)
PROGRESS NOTE    Stephen Fleming  GNF:[id number] DOB: August 06, 1955 DOA: 02/10/2017 PCP: Louie Boston., MD    Brief Narrative: Stephen Fleming is a 62 y.o. male with a history of chronic HFrEF, alcohol abuse (stopped Nov 2018), CAD [facility] Nov 2018 showed diffuse stenoses managed medically), HTN, OSA intolerant of CPAP, and gout who presented to [facility] 1/21 with bilateral leg pain, hemoptysis, severe dyspnea on exertion, found to have bilateral LE DVT's and acute submassive PE on CTA with RV/LV of 1, right heart strain noted on TTE. He was placed on heparin, somewhat hypotensive and was transferred to [facility] for consideration of thrombolytics. No thrombolytics were indicated. Heart failure team was consulted for acute systolic LV and right heart failure. He experienced PAF with RVR since returning to [facility] on amiodarone.    Assessment & Plan:   Active Problems:   Acute on chronic systolic CHF (congestive heart failure) (HCC)   Dyspnea   Chronic combined systolic and diastolic CHF (congestive heart failure) (HCC)   DVT of lower extremity, bilateral (HCC)   Hemoptysis   Acute pulmonary embolism (HCC)   Occult blood in stools   Long term current use of anticoagulant therapy   Malnutrition of moderate degree   Pulmonary infiltrate   1-Acute hypoxic respiratory failure: Due to PE, acute CHF.  Treated with IV heparin, IV lasix.  Stable. On RA<  CT chest with left lobe infection, vs infarct, loculated effusion. Await pulmonary recommendation regarding thoracentesis.   2-Acute submassive PE with right heart strain and bilateral acute DVTs: Suspect LLL atelectasis due to pulmonary infarct.  Was treated with IV heparin. Transition to Eliquis on 02-17-2017.   Evaluated by CCM and IR. No EKOS. If hemoptysis get worse , could consider IVC filter.  Patient report hemoptysis the same.  Pulmonary following.   Acute on chronic systolic CHF: EF worsened 20-25% -> 10-15%. Possibly related to chronic EtOH in addition  to medically managed CAD. HF team following.  On oral lasix. Started on cozaar, spironolactone.   PNA; vs lung infarct. Leukocytosis; chest x ray with worsening infiltrates PNA vs infarct. Started IV antibiotics. CCM consulted again.  Will continue with IV antibiotics due to leukocytosis, ct with consolidation and loculated pleural effusion.  Vancomycin and cefepime day 2.    PAF with RVR:  On digoxin, amiodarone and eliquis.   CAD w/LBBB: LHC Nov 2018 > medical management. - Continue statin, anticoagulation   ? Possible GI bleeding: Rusty stool +FOBT 1/26 due to occult GIB vs. swallowed hemoptysis. +FOBT. GI consulted, though symptoms resolved.  Hb has remain stable.  Will consider NM tagged RBC scan if gross GI bleed.  GI Consulted, Dr Loreta Ave, evaluated patient. Occult blood positive might be related to hemoptysis.    Right atrial thrombus:  - Transitioned to eliquis as above.   EtOH abuse: Not recently. CIWA discontinued without evidence of withdrawal. - Continue supplements   Moderate protein calorie malnutrition:  - RD consulted, supplements as ordered.      DVT prophylaxis: Eliquis  Code Status: full code.  Family Communication: care discussed with patient  Disposition Plan: Needs SNF, [facility]    Consultants:   Cardiology   PCCM primary through 1/24      Procedures:  Echo1/22/2019 Left ventricle: The cavity size was moderately dilated. Wall thickness was normal. The estimated ejection fraction was in the range of 10% to 15%. Diffuse hypokinesis. The study is not technically sufficient to allow evaluation of LV diastolic function. -  Regional wall motion abnormality: Akinesis of the basal-mid anteroseptal myocardium. - Aortic valve: There was mild regurgitation.  - Mitral valve: Mildly calcified annulus. There was mild regurgitation. - Left atrium: The atrium was severely dilated. - Right ventricle: The ventricular septum  is flattend in systole and diastole consistent with RV pressure and volume overload. Systolic function was moderately reduced. TAPSE: 12.6 mm . - Right atrium: There is a semimobile linear structure in the right atrium that likely represents a thrombus. The atrium was moderately dilated. - Atrial septum: No defect or patent foramen ovale was identified. - Tricuspid valve: There was moderate regurgitation. - Pulmonary arteries: Systolic pressure was moderately increased. PA peak pressure: 46 mm Hg (S). - Inferior vena cava: The vessel was dilated. The respirophasic diameter changes were blunted (<50%), consistent with elevated central venous pressure.  ECHO 11/2016 EF 20-25% RV mild/mod dilated. RV normal.   LHC/RHC 12/04/2016  Prox RCA to Mid RCA lesion is 30% stenosed.  RPDA lesion is 50% stenosed.  Mid LAD lesion is 25% stenosed.  Prox LAD lesion is 20% stenosed.  Ost 1st Diag lesion is 60% stenosed.  Ost 1st Mrg lesion is 95% stenosed.  Ost 2nd Mrg lesion is 70% stenosed.  Hemodynamic findings consistent with moderate pulmonary hypertension. 1. CAD with 60% first diagonal, 95% ostial small OM1, 70% OM2, and 50% mid PDA 2. Moderate pulmonary HTN with mean PAP 37 mm Hg 3. Moderately elevated LV filling pressures with PCWP of 29 mm Hg     Antimicrobials:  none   Subjective: He is sleepy this am. Wake up answer few questions. Asking for ice water. Report hemoptysis the same. Dyspnea improved.    Objective: Vitals:   02/19/17 2300 02/20/17 0355 02/20/17 0358 02/20/17 0745  BP: (!) 101/57 (!) 114/91  (!) 92/59  Pulse: (!) 104 (!) 116  90  Resp: (!) 26 (!) 36  11  Temp: 97.8 F (36.6 C) 97.7 F (36.5 C)  (!) 96.3 F (35.7 C)  TempSrc: Oral Oral  Axillary  SpO2: 94% 95%  (!) 88%  Weight:   74.8 kg (164 lb 14.4 oz)   Height:        Intake/Output Summary (Last 24 hours) at 02/20/2017 0820 Last data filed at 02/20/2017 0403 Gross per 24  hour  Intake 1280 ml  Output 2350 ml  Net -1070 ml   Filed Weights   02/18/17 0500 02/19/17 0415 02/20/17 0358  Weight: 76.8 kg (169 lb 5 oz) 73.6 kg (162 lb 4.8 oz) 74.8 kg (164 lb 14.4 oz)    Examination:  General exam: chronic ill appearing, NAD Respiratory system: Normal respiratory effort, no wheezing.  Cardiovascular system:  S 1, S 2 RRR Gastrointestinal system: BS present, soft, nt Central nervous system: sleepy  Extremities:  Moves extremities passively  Skin: no rash     Data Reviewed: I have personally reviewed following labs and imaging studies  CBC: Recent Labs  Lab 02/16/17 0732 02/17/17 0425 02/18/17 0531 02/19/17 0334 02/20/17 0500  WBC 21.7* 20.4* 20.4* 23.7* 23.4*  HGB 13.6 12.4* 11.8* 12.0* 12.5*  HCT 39.7 36.5* 34.2* 35.1* 36.5*  MCV 93.9 92.4 91.9 91.9 91.9  PLT 189 196 198 244 241   Basic Metabolic Panel: Recent Labs  Lab 02/16/17 0250 02/17/17 0425 02/18/17 0531 02/19/17 0334 02/20/17 0500  NA 132* 133* 133* 135 132*  K 3.9 3.9 3.3* 3.2* 3.3*  CL 98* 94* 92* 94* 95*  CO2 26 27 26 29 29   GLUCOSE  133* 109* 276* 121* 262*  BUN 17 17 17 18 17   CREATININE 0.60* 0.73 0.62 0.54* 0.53*  CALCIUM 8.5* 8.4* 7.8* 7.9* 7.6*   GFR: Estimated Creatinine Clearance: 98.9 mL/min (A) (by C-G formula based on SCr of 0.53 mg/dL (L)). Liver Function Tests: No results for input(s): AST, ALT, ALKPHOS, BILITOT, PROT, ALBUMIN in the last 168 hours. No results for input(s): LIPASE, AMYLASE in the last 168 hours. No results for input(s): AMMONIA in the last 168 hours. Coagulation Profile: Recent Labs  Lab 02/16/17 0731  INR 1.81   Cardiac Enzymes: No results for input(s): CKTOTAL, CKMB, CKMBINDEX, TROPONINI in the last 168 hours. BNP (last 3 results) No results for input(s): PROBNP in the last 8760 hours. HbA1C: No results for input(s): HGBA1C in the last 72 hours. CBG: Recent Labs  Lab 02/16/17 2331 02/17/17 0332 02/17/17 0712 02/18/17 0826  02/19/17 0814  GLUCAP 99 109* 105* 137* 90   Lipid Profile: No results for input(s): CHOL, HDL, LDLCALC, TRIG, CHOLHDL, LDLDIRECT in the last 72 hours. Thyroid Function Tests: No results for input(s): TSH, T4TOTAL, FREET4, T3FREE, THYROIDAB in the last 72 hours. Anemia Panel: No results for input(s): VITAMINB12, FOLATE, FERRITIN, TIBC, IRON, RETICCTPCT in the last 72 hours. Sepsis Labs: No results for input(s): PROCALCITON, LATICACIDVEN in the last 168 hours.  Recent Results (from the past 240 hour(s))  MRSA PCR Screening     Status: Abnormal   Collection Time: 02/11/17  7:59 PM  Result Value Ref Range Status   MRSA by PCR POSITIVE (A) NEGATIVE Final    Comment:        The GeneXpert MRSA Assay (FDA approved for NASAL specimens only), is one component of a comprehensive MRSA colonization surveillance program. It is not intended to diagnose MRSA infection nor to guide or monitor treatment for MRSA infections. RESULT CALLED TO, READ BACK BY AND VERIFIED WITH: H DADAMO RN 02/12/17 119 JDW          Radiology Studies: Ct Chest Wo Contrast  Result Date: 02/19/2017 CLINICAL DATA:  Acute respiratory illness.  Pleural effusion. EXAM: CT CHEST WITHOUT CONTRAST TECHNIQUE: Multidetector CT imaging of the chest was performed following the standard protocol without IV contrast. COMPARISON:  02/11/2017 FINDINGS: Cardiovascular: There is moderate cardiac enlargement. Aortic atherosclerosis. Calcification in the LAD and left circumflex and RCA coronary arteries noted. Mediastinum/Nodes: The trachea appears patent and is midline. Normal appearance of the esophagus. Multiple prominent, non pathologically enlarged mediastinal lymph nodes identified. No axillary or supraclavicular adenopathy. Lungs/Pleura: There is a moderate to large loculated left pleural effusion identified which is new from the previous exam. There is dense airspace consolidation involving the entire left lower lobe and the  posterior left upper lobe. Mild subpleural consolidation within the posterior right middle lobe noted, image 17 of series 7. Upper Abdomen: No acute abnormality. Musculoskeletal: Degenerative disc disease identified within the thoracic spine. No aggressive lytic or sclerotic bone lesions. IMPRESSION: 1. Moderate to large loculated left pleural effusion. 2. Dense airspace consolidation involves the entire left lower lobe and posterior left upper lobe which may be the sequelae of pulmonary infarct and/or infection. 3. Mild subpleural consolidation in the posterior right middle lobe. 4. Aortic Atherosclerosis (ICD10-I70.0). Three vessel coronary artery calcifications noted. Electronically Signed   By: Signa Kell M.D.   On: 02/19/2017 15:32   Dg Chest Port 1 View  Result Date: 02/19/2017 CLINICAL DATA:  Leukocytosis, cough and congestion. EXAM: PORTABLE CHEST 1 VIEW COMPARISON:  CT chest 02/11/2017 and  chest radiograph 02/10/2017. FINDINGS: Trachea is midline. Heart is enlarged. Right PICC tip projects over the low SVC. Marked increase in airspace consolidation in the left upper and left lower lobes, with a left pleural effusion. Right lung is grossly clear. IMPRESSION: 1. Marked worsening in left upper and left lower lobe consolidation, worrisome for pneumonia. Combination of pneumonia and infarct is also considered, given recent diagnosis of pulmonary emboli. 2. Left pleural effusion. Electronically Signed   By: Leanna Battles M.D.   On: 02/19/2017 10:03        Scheduled Meds: . amiodarone  200 mg Oral BID  . apixaban  10 mg Oral BID   Followed by  . [START ON 02/24/2017] apixaban  5 mg Oral BID  . atorvastatin  40 mg Oral q1800  . digoxin  0.125 mg Oral Daily  . feeding supplement (ENSURE ENLIVE)  237 mL Oral QID  . folic acid  1 mg Oral Daily  . [START ON 02/21/2017] furosemide  40 mg Oral Daily  . losartan  12.5 mg Oral Daily  . mometasone-formoterol  2 puff Inhalation BID  . multivitamin  with minerals  1 tablet Oral Daily  . pantoprazole (PROTONIX) IV  40 mg Intravenous Q12H  . potassium chloride  40 mEq Oral Once  . QUEtiapine  25 mg Oral QHS  . sodium chloride flush  10-40 mL Intracatheter Q12H  . sodium chloride flush  3 mL Intravenous Q12H  . spironolactone  25 mg Oral Daily  . thiamine  100 mg Oral Daily   Continuous Infusions: . ceFEPime (MAXIPIME) IV Stopped (02/20/17 8119)  . vancomycin 750 mg (02/20/17 0635)     LOS: 9 days    Time spent: 35 minutes.     Alba Cory, MD [facility] Pager [phone]  If 7PM-7AM, please contact night-coverage [email or web address] Password [email or web address] 02/20/2017, 8:20 AM

## 2017-02-21 ENCOUNTER — Inpatient Hospital Stay (HOSPITAL_COMMUNITY): Payer: BLUE CROSS/BLUE SHIELD

## 2017-02-21 DIAGNOSIS — I2609 Other pulmonary embolism with acute cor pulmonale: Secondary | ICD-10-CM

## 2017-02-21 DIAGNOSIS — I5042 Chronic combined systolic (congestive) and diastolic (congestive) heart failure: Secondary | ICD-10-CM

## 2017-02-21 DIAGNOSIS — I82403 Acute embolism and thrombosis of unspecified deep veins of lower extremity, bilateral: Secondary | ICD-10-CM

## 2017-02-21 LAB — APTT
aPTT: 80 seconds — ABNORMAL HIGH (ref 24–36)
aPTT: >200 seconds (ref 24–36)

## 2017-02-21 LAB — BASIC METABOLIC PANEL
Anion gap: 11 (ref 5–15)
BUN: 18 mg/dL (ref 6–20)
CALCIUM: 8 mg/dL — AB (ref 8.9–10.3)
CO2: 27 mmol/L (ref 22–32)
CREATININE: 0.55 mg/dL — AB (ref 0.61–1.24)
Chloride: 99 mmol/L — ABNORMAL LOW (ref 101–111)
GFR calc Af Amer: >60 mL/min (ref >60)
GFR calc non Af Amer: >60 mL/min (ref >60)
GLUCOSE: 114 mg/dL — AB (ref 65–99)
Potassium: 4.2 mmol/L (ref 3.5–5.1)
Sodium: 137 mmol/L (ref 135–145)

## 2017-02-21 LAB — CBC
HEMATOCRIT: 37.6 % — AB (ref 39.0–52.0)
Hemoglobin: 12.9 g/dL — ABNORMAL LOW (ref 13.0–17.0)
MCH: 31.6 pg (ref 26.0–34.0)
MCHC: 34.3 g/dL (ref 30.0–36.0)
MCV: 92.2 fL (ref 78.0–100.0)
Platelets: 320 10*3/uL (ref 150–400)
RBC: 4.08 MIL/uL — ABNORMAL LOW (ref 4.22–5.81)
RDW: 15.2 % (ref 11.5–15.5)
WBC: 29.4 10*3/uL — ABNORMAL HIGH (ref 4.0–10.5)

## 2017-02-21 LAB — HEPARIN LEVEL (UNFRACTIONATED)
Heparin Unfractionated: >2.2 IU/mL — ABNORMAL HIGH (ref 0.30–0.70)
Heparin Unfractionated: >2.2 IU/mL — ABNORMAL HIGH (ref 0.30–0.70)

## 2017-02-21 LAB — COOXEMETRY PANEL
Carboxyhemoglobin: 1.9 % — ABNORMAL HIGH (ref 0.5–1.5)
METHEMOGLOBIN: 0.8 % (ref 0.0–1.5)
O2 Saturation: 67 %
TOTAL HEMOGLOBIN: 13.6 g/dL (ref 12.0–16.0)

## 2017-02-21 LAB — PROTIME-INR
INR: 1.75
Prothrombin Time: 20.3 seconds — ABNORMAL HIGH (ref 11.4–15.2)

## 2017-02-21 MED ORDER — LOSARTAN POTASSIUM 25 MG PO TABS
12.5000 mg | ORAL_TABLET | Freq: Two times a day (BID) | ORAL | Status: DC
  Administered 2017-02-21 – 2017-02-22 (×4): 12.5 mg via ORAL
  Administered 2017-02-23: 10:00:00 via ORAL
  Administered 2017-02-23 – 2017-02-26 (×6): 12.5 mg via ORAL
  Filled 2017-02-21 (×11): qty 1

## 2017-02-21 MED ORDER — NALOXONE HCL 0.4 MG/ML IJ SOLN
INTRAMUSCULAR | Status: AC
  Filled 2017-02-21: qty 1

## 2017-02-21 MED ORDER — MIDAZOLAM HCL 2 MG/2ML IJ SOLN
INTRAMUSCULAR | Status: AC
  Administered 2017-02-21: 13:00:00
  Filled 2017-02-21: qty 2

## 2017-02-21 MED ORDER — HEPARIN (PORCINE) IN NACL 100-0.45 UNIT/ML-% IJ SOLN
1550.0000 [IU]/h | INTRAMUSCULAR | Status: AC
  Administered 2017-02-22 – 2017-02-24 (×3): 1550 [IU]/h via INTRAVENOUS
  Filled 2017-02-21 (×4): qty 250

## 2017-02-21 MED ORDER — FENTANYL CITRATE (PF) 100 MCG/2ML IJ SOLN
INTRAMUSCULAR | Status: AC | PRN
  Administered 2017-02-21: 25 ug via INTRAVENOUS

## 2017-02-21 MED ORDER — MIDAZOLAM HCL 2 MG/2ML IJ SOLN
INTRAMUSCULAR | Status: AC | PRN
  Administered 2017-02-21: 0.5 mg via INTRAVENOUS

## 2017-02-21 MED ORDER — FLUMAZENIL 0.5 MG/5ML IV SOLN
INTRAVENOUS | Status: AC
  Filled 2017-02-21: qty 5

## 2017-02-21 MED ORDER — FENTANYL CITRATE (PF) 100 MCG/2ML IJ SOLN
INTRAMUSCULAR | Status: AC
  Administered 2017-02-21: 13:00:00
  Filled 2017-02-21: qty 2

## 2017-02-21 NOTE — Progress Notes (Signed)
PROGRESS NOTE    Stephen Fleming  YBO:[id number] DOB: 11-30-1955 DOA: 02/10/2017 PCP: Louie Boston., MD    Brief Narrative: Stephen Fleming is a 62 y.o. male with a history of chronic HFrEF, alcohol abuse (stopped Nov 2018), CAD [facility] Nov 2018 showed diffuse stenoses managed medically), HTN, OSA intolerant of CPAP, and gout who presented to [facility] 1/21 with bilateral leg pain, hemoptysis, severe dyspnea on exertion, found to have bilateral LE DVT's and acute submassive PE on CTA with RV/LV of 1, right heart strain noted on TTE. He was placed on heparin, somewhat hypotensive and was transferred to [facility] for consideration of thrombolytics. No thrombolytics were indicated. Heart failure team was consulted for acute systolic LV and right heart failure. He experienced PAF with RVR since returning to [facility] on amiodarone.    Assessment & Plan:   Active Problems:   Acute on chronic systolic CHF (congestive heart failure) (HCC)   Dyspnea   Chronic combined systolic and diastolic CHF (congestive heart failure) (HCC)   DVT of lower extremity, bilateral (HCC)   Hemoptysis   Acute pulmonary embolism (HCC)   Occult blood in stools   Long term current use of anticoagulant therapy   Malnutrition of moderate degree   Pulmonary infiltrate   Pleural effusion on left   1-Acute hypoxic respiratory failure: Due to PE, acute CHF.  Treated with IV heparin, IV lasix.  Stable. On RA<  CT chest with left lobe infection, vs infarct, loculated effusion.  Underwent CT guide thoracentesis 2-0, follow  Pleural fluid results.   2-Acute submassive PE with right heart strain and bilateral acute DVTs: Suspect LLL atelectasis due to pulmonary infarct.  Was treated with IV heparin. Transition to Eliquis on 02-17-2017.   Evaluated by CCM and IR. No EKOS. If hemoptysis get worse , could consider IVC filter.  Patient report hemoptysis the same.  Pulmonary following.  Heparin to be resume at 3 PM/   Acute on chronic systolic CHF:  EF worsened 20-25% -> 10-15%. Possibly related to chronic EtOH in addition to medically managed CAD. HF team following.  On oral lasix. Started on cozaar, spironolactone.   PNA; vs lung infarct. Leukocytosis; chest x ray with worsening infiltrates PNA vs infarct. Started IV antibiotics. CCM consulted again.  Will continue with IV antibiotics due to leukocytosis, ct with consolidation and loculated pleural effusion. Underwent CT guide thoracentesis. Wbc continue to increases.  Vancomycin and cefepime day 3.    PAF with RVR:  On digoxin, amiodarone and eliquis.   CAD w/LBBB: LHC Nov 2018 > medical management. - Continue statin, anticoagulation   ? Possible GI bleeding: Rusty stool +FOBT 1/26 due to occult GIB vs. swallowed hemoptysis. +FOBT. GI consulted, though symptoms resolved.  Hb has remain stable.  Will consider NM tagged RBC scan if gross GI bleed.  GI Consulted, Dr Loreta Ave, evaluated patient. Occult blood positive might be related to hemoptysis.    Right atrial thrombus:  - Transitioned to eliquis as above.   EtOH abuse: Not recently. CIWA discontinued without evidence of withdrawal. - Continue supplements   Moderate protein calorie malnutrition:  - RD consulted, supplements as ordered.      DVT prophylaxis: Eliquis  Code Status: full code.  Family Communication: care discussed with patient  Disposition Plan: Needs SNF, [facility]    Consultants:   Cardiology   PCCM primary through 1/24      Procedures:  Echo1/22/2019 Left ventricle: The cavity size was moderately dilated. Wall thickness was  normal. The estimated ejection fraction was in the range of 10% to 15%. Diffuse hypokinesis. The study is not technically sufficient to allow evaluation of LV diastolic function. - Regional wall motion abnormality: Akinesis of the basal-mid anteroseptal myocardium. - Aortic valve: There was mild regurgitation.  - Mitral valve: Mildly calcified  annulus. There was mild regurgitation. - Left atrium: The atrium was severely dilated. - Right ventricle: The ventricular septum is flattend in systole and diastole consistent with RV pressure and volume overload. Systolic function was moderately reduced. TAPSE: 12.6 mm . - Right atrium: There is a semimobile linear structure in the right atrium that likely represents a thrombus. The atrium was moderately dilated. - Atrial septum: No defect or patent foramen ovale was identified. - Tricuspid valve: There was moderate regurgitation. - Pulmonary arteries: Systolic pressure was moderately increased. PA peak pressure: 46 mm Hg (S). - Inferior vena cava: The vessel was dilated. The respirophasic diameter changes were blunted (<50%), consistent with elevated central venous pressure.  ECHO 11/2016 EF 20-25% RV mild/mod dilated. RV normal.   LHC/RHC 12/04/2016  Prox RCA to Mid RCA lesion is 30% stenosed.  RPDA lesion is 50% stenosed.  Mid LAD lesion is 25% stenosed.  Prox LAD lesion is 20% stenosed.  Ost 1st Diag lesion is 60% stenosed.  Ost 1st Mrg lesion is 95% stenosed.  Ost 2nd Mrg lesion is 70% stenosed.  Hemodynamic findings consistent with moderate pulmonary hypertension. 1. CAD with 60% first diagonal, 95% ostial small OM1, 70% OM2, and 50% mid PDA 2. Moderate pulmonary HTN with mean PAP 37 mm Hg 3. Moderately elevated LV filling pressures with PCWP of 29 mm Hg     Antimicrobials:  none   Subjective: He is alert, denies worsening dyspnea, hemoptysis persist     Objective: Vitals:   02/21/17 0500 02/21/17 0700 02/21/17 0718 02/21/17 0721  BP:  91/67 91/67   Pulse:  97 97   Resp:  (!) 23 20   Temp:  98.3 F (36.8 C)    TempSrc:  Oral    SpO2:  95% 92% 92%  Weight: 75 kg (165 lb 5.5 oz)     Height:        Intake/Output Summary (Last 24 hours) at 02/21/2017 0800 Last data filed at 02/21/2017 1610 Gross per 24 hour  Intake 1391.21 ml    Output 1475 ml  Net -83.79 ml   Filed Weights   02/19/17 0415 02/20/17 0358 02/21/17 0500  Weight: 73.6 kg (162 lb 4.8 oz) 74.8 kg (164 lb 14.4 oz) 75 kg (165 lb 5.5 oz)    Examination:  General exam: NAD, Chronic ill appearing.  Respiratory system: bilateral ronchus.  Cardiovascular system:  S 1, S 2 RRR Gastrointestinal system: BS present, soft,  Central nervous system: alert  Extremities:  No edema  Skin: no rash     Data Reviewed: I have personally reviewed following labs and imaging studies  CBC: Recent Labs  Lab 02/17/17 0425 02/18/17 0531 02/19/17 0334 02/20/17 0500 02/21/17 0300  WBC 20.4* 20.4* 23.7* 23.4* 29.4*  HGB 12.4* 11.8* 12.0* 12.5* 12.9*  HCT 36.5* 34.2* 35.1* 36.5* 37.6*  MCV 92.4 91.9 91.9 91.9 92.2  PLT 196 198 244 241 320   Basic Metabolic Panel: Recent Labs  Lab 02/17/17 0425 02/18/17 0531 02/19/17 0334 02/20/17 0500 02/21/17 0300  NA 133* 133* 135 132* 137  K 3.9 3.3* 3.2* 3.3* 4.2  CL 94* 92* 94* 95* 99*  CO2 27 26 29 29  27  GLUCOSE 109* 276* 121* 262* 114*  BUN 17 17 18 17 18   CREATININE 0.73 0.62 0.54* 0.53* 0.55*  CALCIUM 8.4* 7.8* 7.9* 7.6* 8.0*   GFR: Estimated Creatinine Clearance: 98.9 mL/min (A) (by C-G formula based on SCr of 0.55 mg/dL (L)). Liver Function Tests: No results for input(s): AST, ALT, ALKPHOS, BILITOT, PROT, ALBUMIN in the last 168 hours. No results for input(s): LIPASE, AMYLASE in the last 168 hours. No results for input(s): AMMONIA in the last 168 hours. Coagulation Profile: Recent Labs  Lab 02/16/17 0731 02/21/17 0300  INR 1.81 1.75   Cardiac Enzymes: No results for input(s): CKTOTAL, CKMB, CKMBINDEX, TROPONINI in the last 168 hours. BNP (last 3 results) No results for input(s): PROBNP in the last 8760 hours. HbA1C: No results for input(s): HGBA1C in the last 72 hours. CBG: Recent Labs  Lab 02/17/17 0332 02/17/17 0712 02/18/17 0826 02/19/17 0814 02/20/17 0837  GLUCAP 109* 105* 137* 90  103*   Lipid Profile: No results for input(s): CHOL, HDL, LDLCALC, TRIG, CHOLHDL, LDLDIRECT in the last 72 hours. Thyroid Function Tests: No results for input(s): TSH, T4TOTAL, FREET4, T3FREE, THYROIDAB in the last 72 hours. Anemia Panel: No results for input(s): VITAMINB12, FOLATE, FERRITIN, TIBC, IRON, RETICCTPCT in the last 72 hours. Sepsis Labs: No results for input(s): PROCALCITON, LATICACIDVEN in the last 168 hours.  Recent Results (from the past 240 hour(s))  MRSA PCR Screening     Status: Abnormal   Collection Time: 02/11/17  7:59 PM  Result Value Ref Range Status   MRSA by PCR POSITIVE (A) NEGATIVE Final    Comment:        The GeneXpert MRSA Assay (FDA approved for NASAL specimens only), is one component of a comprehensive MRSA colonization surveillance program. It is not intended to diagnose MRSA infection nor to guide or monitor treatment for MRSA infections. RESULT CALLED TO, READ BACK BY AND VERIFIED WITH: H DADAMO RN 02/12/17 119 JDW          Radiology Studies: Ct Chest Wo Contrast  Result Date: 02/19/2017 CLINICAL DATA:  Acute respiratory illness.  Pleural effusion. EXAM: CT CHEST WITHOUT CONTRAST TECHNIQUE: Multidetector CT imaging of the chest was performed following the standard protocol without IV contrast. COMPARISON:  02/11/2017 FINDINGS: Cardiovascular: There is moderate cardiac enlargement. Aortic atherosclerosis. Calcification in the LAD and left circumflex and RCA coronary arteries noted. Mediastinum/Nodes: The trachea appears patent and is midline. Normal appearance of the esophagus. Multiple prominent, non pathologically enlarged mediastinal lymph nodes identified. No axillary or supraclavicular adenopathy. Lungs/Pleura: There is a moderate to large loculated left pleural effusion identified which is new from the previous exam. There is dense airspace consolidation involving the entire left lower lobe and the posterior left upper lobe. Mild subpleural  consolidation within the posterior right middle lobe noted, image 17 of series 7. Upper Abdomen: No acute abnormality. Musculoskeletal: Degenerative disc disease identified within the thoracic spine. No aggressive lytic or sclerotic bone lesions. IMPRESSION: 1. Moderate to large loculated left pleural effusion. 2. Dense airspace consolidation involves the entire left lower lobe and posterior left upper lobe which may be the sequelae of pulmonary infarct and/or infection. 3. Mild subpleural consolidation in the posterior right middle lobe. 4. Aortic Atherosclerosis (ICD10-I70.0). Three vessel coronary artery calcifications noted. Electronically Signed   By: Signa Kell M.D.   On: 02/19/2017 15:32   Dg Chest Port 1 View  Result Date: 02/19/2017 CLINICAL DATA:  Leukocytosis, cough and congestion. EXAM: PORTABLE CHEST 1 VIEW COMPARISON:  CT chest 02/11/2017 and chest radiograph 02/10/2017. FINDINGS: Trachea is midline. Heart is enlarged. Right PICC tip projects over the low SVC. Marked increase in airspace consolidation in the left upper and left lower lobes, with a left pleural effusion. Right lung is grossly clear. IMPRESSION: 1. Marked worsening in left upper and left lower lobe consolidation, worrisome for pneumonia. Combination of pneumonia and infarct is also considered, given recent diagnosis of pulmonary emboli. 2. Left pleural effusion. Electronically Signed   By: Leanna Battles M.D.   On: 02/19/2017 10:03        Scheduled Meds: . amiodarone  200 mg Oral BID  . atorvastatin  40 mg Oral q1800  . digoxin  0.125 mg Oral Daily  . feeding supplement (ENSURE ENLIVE)  237 mL Oral QID  . folic acid  1 mg Oral Daily  . furosemide  40 mg Oral Daily  . losartan  12.5 mg Oral Daily  . mometasone-formoterol  2 puff Inhalation BID  . multivitamin with minerals  1 tablet Oral Daily  . pantoprazole (PROTONIX) IV  40 mg Intravenous Q12H  . QUEtiapine  25 mg Oral QHS  . sodium chloride flush  10-40 mL  Intracatheter Q12H  . sodium chloride flush  3 mL Intravenous Q12H  . spironolactone  25 mg Oral Daily  . thiamine  100 mg Oral Daily   Continuous Infusions: . ceFEPime (MAXIPIME) IV Stopped (02/21/17 0556)  . heparin Stopped (02/21/17 0755)  . vancomycin Stopped (02/21/17 0730)     LOS: 10 days    Time spent: 35 minutes.     Alba Cory, MD [facility] Pager [phone]  If 7PM-7AM, please contact night-coverage [email or web address] Password [email or web address] 02/21/2017, 8:00 AM

## 2017-02-21 NOTE — Progress Notes (Signed)
CCMD called; pt had 10 beat run of vtach. Will continue to monitor.   Stephen Fleming  Marlinda Miranda, RN

## 2017-02-21 NOTE — Progress Notes (Signed)
ANTICOAGULATION CONSULT NOTE Pharmacy Consult for Heparin >> apixaban >>Heparin  Indication: pulmonary embolus  No Known Allergies  Patient Measurements: Height: 5\' 10"  (177.8 cm) Weight: 164 lb 14.4 oz (74.8 kg) IBW/kg (Calculated) : 73 HEPARIN DW (KG): 77.2  Vital Signs: Temp: 98.5 F (36.9 C) (02/01 0001) Temp Source: Oral (02/01 0001) BP: 93/66 (02/01 0001) Pulse Rate: 107 (02/01 0001)  Labs: Recent Labs    02/18/17 0500  02/18/17 0531 02/19/17 0334 02/20/17 0500 02/21/17 0300  HGB  --    < > 11.8* 12.0* 12.5* 12.9*  HCT  --    < > 34.2* 35.1* 36.5* 37.6*  PLT  --    < > 198 244 241 320  APTT  --   --  49*  --   --  80*  LABPROT  --   --   --   --   --  20.3*  INR  --   --   --   --   --  1.75  HEPARINUNFRC >2.20*  --   --   --   --   --   CREATININE  --    < > 0.62 0.54* 0.53* 0.55*   < > = values in this interval not displayed.    Estimated Creatinine Clearance: 98.9 mL/min (A) (by C-G formula based on SCr of 0.55 mg/dL (L)).  Assessment: 22 yoM presents with PE/DVT initially started on IV heparin. Briefly transitioned to apixaban but back on IV heparin due to concern for GIB. Once stabilized, transitioned back to apixaban.   MD consulted yesterday for worsening infiltrate in LUL. Patient still having vigorous cough with some bloody secretions. Plan to transition back to heparin for potential CT guided drain to evaluate for hemothorax or infected parapneumonic effusion. Hgb stable at 12.5 and platelets WNL. No signs/symptoms of overt bleeding. Last dose of apixaban was on 1/31 at 0916.  2/1 AM: aPTT is therapeutic, using aPTT due to apixaban influence on anti-Xa levels  Goal of Therapy:  Heparin level: 0.3-0.7 APTT level: 66-102 sec Monitor platelets by anticoagulation protocol: Yes   Plan:  Cont heparin drip at 1300 units/hr 1200 aPTT  Abran Duke, PharmD, BCPS Clinical Pharmacist Phone: [phone]

## 2017-02-21 NOTE — Progress Notes (Signed)
[facility] PCCM Attending:  Summary: 62 y/o male with systolic heart failure admitted with a PE on 1/21  S: We were re-consulted yesterday for a worsening infiltrate in the left upper lobe He still has a vigorous cough , is on 4 L [place]  O:  Vitals:   02/21/17 0945 02/21/17 1022  BP: 93/75   Pulse:  95  Resp:    Temp:    SpO2:     RA  General:  Resting comfortably in bed, sleepy, but following commands HENT: NCAT OP clear PULM: diminished breath sounds on the left B,  Resp. Regular and unlabored CV: S1, S2, RRR, no mgr GI: BS+, soft, non tender, non-distended MSK: normal bulk and tone, no obvious abnormalities Neuro: awake, alert, no distress, MAEW  CT chest images reviewed: infiltrate Left lower lobe, small effusion left lower lobe, presumably large loculated left upper lobe effusion  Bedside ultrasound of the left chest by Dr. Kendrick Fries on 1/31: + for  fluid collection in the superior/anterior aspect of the left hemothorax and a very small effusion near the left lower lobe  Impression/plan:  PE/ DVT:  Plan: Transition Eliquis to Heparin per pharmacy Titrate oxygen for sats > 94% Will need follow up with pulmonary as outpatient  Infiltrate:  - suspect infarct, possible pneumonia - Leukocytosis - Afebrile Plan:  - Agree with broad spectrum ABX for now  - Trend CXR for progression - Trend fever curve/ WBC - If remains afebrile over next 24 hours, consider d/c ABX - Culture as is clinically indicated   Effusion - Effusion on the left: the left effusion is anterior and superior and doesn't appear to communicate with the left lower lobe effusion  - Successful CT guided drainage of Left sided pleural effusion 02/21/2017 per IR for 240 cc slightly dark serous pleural  Fluid>> aspirated and sent to lab for analysis  to evaluate for hemothorax or infected parapneumonic effusion. - 10 Fr CT placed to water seal>> no leak>> Continues to drain dark serous, and additional 100 cc at my  count ( Total of 340 cc) -  Transitioned off Eliquis to heparin 02/20/17  For procedure.   Plan: - Follow Micro and pathology, cytology of pleural fluid - Monitor drainage - Follow CXR - If recollects/ continues to drain large amounts  will need to consider VATS as if left will  Likely develop a fibrothorax      Bevelyn Ngo, AGACNP [place] PCCM Pager: [phone]  Attending Note:  62 year old male with massive PE, pulmonary infarct and pleural effusion and associated pneumonia.  Patient had the fluid drain via IR guided drainage and loculations were noted.  IR placed a 10 french CT draining dark serous material.  On exam, lungs with crackles on the L>R noted.  Discussed with PCCM-NP.  I reviewed CT myself, left effusion loculated noted.   Pleural effusion:  - Continue CT drainage at water seal  PE:  - Eliquis off, heparin drip per pharmacy  Hypoxemia:  - Titrate O2 for sat of 88-92%.  COPD:  - Dulera  - Duonebs  - PRN albuterol  PCCM will see again on Monday.  Patient seen and examined, agree with above note.  I dictated the care and orders written for this patient under my direction.  Alyson Reedy, MD [phone]

## 2017-02-21 NOTE — Sedation Documentation (Signed)
Patient is resting comfortably. 

## 2017-02-21 NOTE — Procedures (Signed)
Pre procedural Dx: Symptomatic left sided pleural effusion Post procedural Dx: Same  Technically successful CT guided placed of a 10 Fr drainage catheter placement into the left pleural space yielding 240 cc of slightly dark serous fluid.    All aspirated samples sent to the laboratory for analysis.    EBL: Minimal  Complications: None immediate  Katherina Right, MD Pager #: [phone]

## 2017-02-21 NOTE — Progress Notes (Signed)
Physical Therapy Treatment Patient Details Name: Stephen Fleming MRN: [id number] DOB: 23-Jul-1955 Today's Date: 02/21/2017    History of Present Illness Pt is a 62 y.o. male with PMH of combined systolic/diastoic HF, CAD, moderate MR, HTN, OSA (intolerant CPAP), admitted 02/10/17 with bilateral leg pain and dyspnea. Found to have acute DVT/PE. CT chest 1/30 showing mod-large loculated left effusion and desnse LLL consolidation. 2/1 CT guided drain/thoracentesis and chest tube.    PT Comments    Pt admitted with above diagnosis. Pt currently with functional limitations due to balance and endurance deficits. Pt was able to ambulate with RW with min assist for balance and min assist to steer RW.   Pt will benefit from skilled PT to increase their independence and safety with mobility to allow discharge to the venue listed below.     Follow Up Recommendations  SNF;Supervision/Assistance - 24 hour     Equipment Recommendations  (TBD next venue)    Recommendations for Other Services       Precautions / Restrictions Precautions: chest tube Precautions: Fall Restrictions Weight Bearing Restrictions: No    Mobility  Bed Mobility Overal bed mobility: Needs Assistance Bed Mobility: Supine to Sit;Sit to Supine     Supine to sit: Min guard;HOB elevated Sit to supine: Min guard   General bed mobility comments: min guard for safety  Transfers Overall transfer level: Needs assistance Equipment used: Rolling walker (2 wheeled) Transfers: Sit to/from [employer] Sit to Stand: Min assist Stand pivot transfers: Mod assist       General transfer comment: vc for safe hand placement, assist for power up and steady.  Pt tried to sit on 3N1 x 2 but could not urinate.  Ambulation/Gait Ambulation/Gait assistance: Min assist Ambulation Distance (Feet): 70 Feet Assistive device: Rolling walker (2 wheeled) Gait Pattern/deviations: Step-to pattern;Trunk flexed;Wide base of  support Gait velocity: Decreased Gait velocity interpretation: Below normal speed for age/gender General Gait Details: Slow, slightly unsteady amb with RW from bed to bedside commode. Pt needs to be pushed.  He did walk with max encouragement to hall and back needing cues to stay close to RW and sequence steps and RW correctly.  Also needed assist to steer RW.    Stairs            Wheelchair Mobility    Modified Rankin (Stroke Patients Only)       Balance Overall balance assessment: Needs assistance Sitting-balance support: No upper extremity supported;Feet supported Sitting balance-Leahy Scale: Fair Sitting balance - Comments: sitting EOB with no back support   Standing balance support: Bilateral upper extremity supported;During functional activity Standing balance-Leahy Scale: Poor Standing balance comment: relies on UE support and RW                            Cognition Arousal/Alertness: Awake/alert Behavior During Therapy: Flat affect Overall Cognitive Status: No family/caregiver present to determine baseline cognitive functioning Area of Impairment: Attention;Following commands;Problem solving                   Current Attention Level: Selective   Following Commands: Follows one step commands with increased time     Problem Solving: Requires verbal cues General Comments: Pt is very particular, increased time to follow cues, requires cues for problem solving during session      Exercises General Exercises - Lower Extremity Ankle Circles/Pumps: AAROM;Both;5 reps;Supine Heel Slides: AAROM;Both;5 reps;Supine Hip ABduction/ADduction: AAROM;Both;10 reps;Supine  General Comments        Pertinent Vitals/Pain Pain Assessment: 0-10 Pain Score: 5  Pain Location: left chest tube site and abdomen Pain Descriptors / Indicators: Sharp;Shooting Pain Intervention(s): Limited activity within patient's tolerance;Monitored during  session;Premedicated before session;Repositioned   VSS Home Living                      Prior Function            PT Goals (current goals can now be found in the care plan section) Acute Rehab PT Goals Patient Stated Goal: get better Progress towards PT goals: Progressing toward goals    Frequency    Min 2X/week      PT Plan Current plan remains appropriate    Co-evaluation              AM-PAC PT "6 Clicks" Daily Activity  Outcome Measure  Difficulty turning over in bed (including adjusting bedclothes, sheets and blankets)?: A Little Difficulty moving from lying on back to sitting on the side of the bed? : A Little Difficulty sitting down on and standing up from a chair with arms (e.g., wheelchair, bedside commode, etc,.)?: A Little Help needed moving to and from a bed to chair (including a wheelchair)?: A Little Help needed walking in hospital room?: A Lot Help needed climbing 3-5 steps with a railing? : Total 6 Click Score: 15    End of Session Equipment Utilized During Treatment: Gait belt Activity Tolerance: Patient limited by fatigue Patient left: with call bell/phone within reach;in chair;with chair alarm set;with nursing/sitter in room Nurse Communication: Mobility status PT Visit Diagnosis: Unsteadiness on feet (R26.81);Muscle weakness (generalized) (M62.81)     Time: 8032-1224 PT Time Calculation (min) (ACUTE ONLY): 33 min  Charges:  $Gait Training: 8-22 mins $Therapeutic Exercise: 8-22 mins                    G Codes:       Stephen Fleming,PT Acute Rehabilitation [phone] (pager)    Stephen Fleming 02/21/2017, 4:24 PM

## 2017-02-21 NOTE — Care Management Note (Signed)
Case Management Note  Patient Details  Name: Stephen Fleming MRN: [id number] Date of Birth: 10-May-1955  Subjective/Objective:    Pt admitted with Bil DVTs and submassive PE with right heart strain                Action/Plan:  PTA from home.  SNF recommended - CSW consulted   Expected Discharge Date:                  Expected Discharge Plan:  Skilled Nursing Facility  In-House Referral:  Clinical Social Work  Discharge planning Services  CM Consult  Post Acute Care Choice:    Choice offered to:     DME Arranged:    DME Agency:     HH Arranged:    HH Agency:     Status of Service:     If discussed at [employer], dates discussed:    Additional Comments: 02/21/2017 Pt's latest CT shows left lobe infection vs infarct vs loculated effusion.  Plan is for pt to IR guided thoracentesis today.  Pt still having scant hemoptysis.  Discharge plan continues to be SNF - CSW following Cherylann Parr, RN 02/21/2017, 9:07 AM

## 2017-02-21 NOTE — Progress Notes (Signed)
ANTICOAGULATION CONSULT NOTE - Follow Up Consult  Pharmacy Consult for heparin >> apixaban >>heparin infusion Indication: pulmonary embolus  No Known Allergies  Patient Measurements: Height: 5\' 10"  (177.8 cm) Weight: 165 lb 5.5 oz (75 kg) IBW/kg (Calculated) : 73 HEPARIN DW (KG): 77.2  Vital Signs: Temp: 97.6 F (36.4 C) (02/01 1146) Temp Source: Oral (02/01 1146) BP: 95/72 (02/01 1230) Pulse Rate: 90 (02/01 1230)  Labs: Recent Labs    02/19/17 0334 02/20/17 0500 02/21/17 0235 02/21/17 0300  HGB 12.0* 12.5*  --  12.9*  HCT 35.1* 36.5*  --  37.6*  PLT 244 241  --  320  APTT  --   --   --  80*  LABPROT  --   --   --  20.3*  INR  --   --   --  1.75  HEPARINUNFRC  --   --  >2.20*  --   CREATININE 0.54* 0.53*  --  0.55*    Estimated Creatinine Clearance: 98.9 mL/min (A) (by C-G formula based on SCr of 0.55 mg/dL (L)).  Assessment: 11 yoM presents with PE/DVT initially started on IV heparin. Briefly transitioned to apixaban but back on IV heparin due to concern for GIB. Once stabilized, transitioned back to apixaban.   CCM consulted for worsening LUL infiltrate. CT chest showed moderate to large loculated pleural effusion. Apixaban stopped (last dose 1/31 at 0916) and he was switched back to heparin pending thoracentesis. S/P thoracentesis this morning. Per IR, ok to resume heparin at 3pm today.   Goal of Therapy:  Heparin level: 0.3-0.7 APTT level: 66-102 sec Monitor platelets by anticoagulation protocol: Yes   Plan:  1) At 3pm, resume heparin at 1300 units/hr 2) Check 6 hour heparin level and APTT   Louie Casa, PharmD, BCPS 02/21/2017 1:20 PM

## 2017-02-21 NOTE — Progress Notes (Signed)
Patient ID: Stephen Fleming, male   DOB: 01/30/1955, 62 y.o.   MRN: [id number]     Advanced Heart Failure Rounding Note  HF Cardiology: Stephen Fleming  Subjective:    Coox 67.0%. CVP 7-8  Fatigued this am. Denies CP. Still with occasional scant hemoptysis. For CT guided drain/thoracentesis this am.   CT chest (1/30) with moderate-large loculated left effusion + dense LLL consolidation, ?pulmonary infarct.    Objective:   Weight Range: 165 lb 5.5 oz (75 kg) Body mass index is 23.72 kg/m.   Vital Signs:   Temp:  [97 F (36.1 C)-98.5 F (36.9 C)] 98.3 F (36.8 C) (02/01 0700) Pulse Rate:  [93-107] 97 (02/01 0718) Resp:  [20-32] 20 (02/01 0718) BP: (82-112)/(66-83) 91/67 (02/01 0718) SpO2:  [92 %-99 %] 92 % (02/01 0721) Weight:  [165 lb 5.5 oz (75 kg)] 165 lb 5.5 oz (75 kg) (02/01 0500) Last BM Date: 02/18/17  Weight change: Filed Weights   02/19/17 0415 02/20/17 0358 02/21/17 0500  Weight: 162 lb 4.8 oz (73.6 kg) 164 lb 14.4 oz (74.8 kg) 165 lb 5.5 oz (75 kg)    Intake/Output:   Intake/Output Summary (Last 24 hours) at 02/21/2017 0750 Last data filed at 02/21/2017 0629 Gross per 24 hour  Intake 1391.21 ml  Output 1475 ml  Net -83.79 ml      Physical Exam  CVP 7-8  General: Faituged HEENT: Normal Neck: Supple. JVP 5-6. Carotids 2+ bilat; no bruits. No thyromegaly or nodule noted. Cor: PMI nondisplaced. RRR, No M/G/R noted. Trace edema 1/2 way to knees.  Lungs: Dull left base, diminished throughout.  Abdomen: Soft, non-tender, non-distended, no HSM. No bruits or masses. +BS  Extremities: No cyanosis, clubbing, or rash. R and LLE no edema.  Neuro: Alert & orientedx3, cranial nerves grossly intact. moves all 4 extremities w/o difficulty. Affect flat but appropriate.   Telemetry   NSR 90s, personally reviewed.   EKG    No new tracings.  Labs    CBC Recent Labs    02/20/17 0500 02/21/17 0300  WBC 23.4* 29.4*  HGB 12.5* 12.9*  HCT 36.5* 37.6*  MCV 91.9 92.2    PLT 241 320   Basic Metabolic Panel Recent Labs    11/91/47 0500 02/21/17 0300  NA 132* 137  K 3.3* 4.2  CL 95* 99*  CO2 29 27  GLUCOSE 262* 114*  BUN 17 18  CREATININE 0.53* 0.55*  CALCIUM 7.6* 8.0*   Liver Function Tests No results for input(s): AST, ALT, ALKPHOS, BILITOT, PROT, ALBUMIN in the last 72 hours. No results for input(s): LIPASE, AMYLASE in the last 72 hours. Cardiac Enzymes No results for input(s): CKTOTAL, CKMB, CKMBINDEX, TROPONINI in the last 72 hours.  BNP: BNP (last 3 results) Recent Labs    12/04/16 0030  BNP 1,027.4*    ProBNP (last 3 results) No results for input(s): PROBNP in the last 8760 hours.   D-Dimer No results for input(s): DDIMER in the last 72 hours. Hemoglobin A1C No results for input(s): HGBA1C in the last 72 hours. Fasting Lipid Panel No results for input(s): CHOL, HDL, LDLCALC, TRIG, CHOLHDL, LDLDIRECT in the last 72 hours. Thyroid Function Tests No results for input(s): TSH, T4TOTAL, T3FREE, THYROIDAB in the last 72 hours.  Invalid input(s): FREET3  Other results:  Imaging   No results found.  Medications:     Scheduled Medications: . amiodarone  200 mg Oral BID  . atorvastatin  40 mg Oral q1800  . digoxin  0.125 mg Oral Daily  . feeding supplement (ENSURE ENLIVE)  237 mL Oral QID  . folic acid  1 mg Oral Daily  . furosemide  40 mg Oral Daily  . losartan  12.5 mg Oral Daily  . mometasone-formoterol  2 puff Inhalation BID  . multivitamin with minerals  1 tablet Oral Daily  . pantoprazole (PROTONIX) IV  40 mg Intravenous Q12H  . QUEtiapine  25 mg Oral QHS  . sodium chloride flush  10-40 mL Intracatheter Q12H  . sodium chloride flush  3 mL Intravenous Q12H  . spironolactone  25 mg Oral Daily  . thiamine  100 mg Oral Daily    Infusions: . ceFEPime (MAXIPIME) IV Stopped (02/21/17 0556)  . heparin 1,300 Units/hr (02/20/17 2228)  . vancomycin 750 mg (02/21/17 0629)    PRN Medications: acetaminophen **OR**  acetaminophen, albuterol, diazepam, ondansetron **OR** ondansetron (ZOFRAN) IV, sodium chloride flush, traMADol  Patient Profile   Stephen Fleming is a 62 year old with history of  Combined systolic/diastoic heart failure, CAD (cath in 11/2016 showing60% first diagonal, 95% ostial small OM1, 70% OM2, and 50% mid PDA--> medical management recommended, moderate Stephen, HTN, OSA intolerant CPAP.   Admitted with bilateral leg pain and dyspnea. Acute DVT/PE  Assessment/Plan   1. Bilateral DVTs with PE and pulmonary infarction: Did not have thrombolysis.  Thrombus in transit seen in RA on echo. Still with scant hemoptysis.   CT chest concerning for pulmonary infarction on left as well as left loculated effusion.   - Continue apixaban 10 mg BID (heparin gtt peri-thoracentesis) - For CT guided per drain this am.  2. Acute on chronic systolic CHF: Nonischemic cardiomyopathy, out of proportion to degree of CAD.  May be related to prior heavy ETOH.  Echo (1/19) with moderate LV dilation, EF 10-15%, moderately decreased RV systolic function, D-shaped interventricular septum suggestive of RV pressure/volume overload, PASP 46 mmHg.  Low output HF by RHC in 11/18 (CI 1.65, mean PCWP 29).   - Coox 67% off milrinone - CVP 7-8. Continue lasix 40 mg daily.  - Continue digoxin 0.125 mg daily - Continue losartan 12.5 mg daily - Continue spironolactone 25 mg daily.  - Off ivabradine with paroxysmal atrial fibrillation.  - Noted to have wide LBBB => CRT may be beneficial down the road.  - Can get cardiac MRI this admission prior to discharge given cardiomyopathy of uncertain etiology.  3. CAD: Patient had extensive CAD on 11/18 cath but primarily nonobstructive.  Does not explain cardiomyopathy.  - No s/s of ischemia.    - Continue statin.  4. Suspected atrial fibrillation with RVR:  - Maintaining NSR on amiodarone.  - Continue amiodarone for now, will stop eventually but would give time to recovery from large PE.  -  on apixaban.  5. Heme+ stool:  - Stable Hgb.  6. ID:  - WBCs up to 29.4.  - BCs 23, cannot rule out PNA/HCAP, covering with vancomycin/cefepime.  7. Left pleural effusion: Loculated, moderate-large.  Pulmonary following, ?amenable to thoracentesis.  8. Severe Deconditioning:  - To SNF on discharge.   Graciella Freer, PA-C  02/21/2017 7:50 AM   Advanced Heart Failure Team Pager [phone] (M-F; 7a - 4p)  Please contact [facility] for night-coverage after hours (4p -7a ) and weekends on [email or web address]  Patient seen with PA, agree with the above note.  Stable from cardiovascular standpoint.  Volume status ok, no JVD.  CVP 7-8.   - Can increase losartan to 12.5  mg bid.   Left lung with extensive pulmonary infarction and loculated pleural effusion. CT-guided drainage of effusion today.  Will send for pleural fluid studies and cultures.  We are covering for HCAP with vancomycin/cefepime.   Apixaban held for thoracentesis and he is on heparin gtt.  Will need to transition back onto apixaban after procedure.   [name] 02/21/2017

## 2017-02-22 ENCOUNTER — Inpatient Hospital Stay (HOSPITAL_COMMUNITY): Payer: BLUE CROSS/BLUE SHIELD

## 2017-02-22 LAB — HEPARIN LEVEL (UNFRACTIONATED): Heparin Unfractionated: 0.67 IU/mL (ref 0.30–0.70)

## 2017-02-22 LAB — CBC
HEMATOCRIT: 35.5 % — AB (ref 39.0–52.0)
HEMOGLOBIN: 11.9 g/dL — AB (ref 13.0–17.0)
MCH: 31.1 pg (ref 26.0–34.0)
MCHC: 33.5 g/dL (ref 30.0–36.0)
MCV: 92.7 fL (ref 78.0–100.0)
Platelets: 291 10*3/uL (ref 150–400)
RBC: 3.83 MIL/uL — ABNORMAL LOW (ref 4.22–5.81)
RDW: 15.6 % — ABNORMAL HIGH (ref 11.5–15.5)
WBC: 20.7 10*3/uL — AB (ref 4.0–10.5)

## 2017-02-22 LAB — BASIC METABOLIC PANEL
Anion gap: 11 (ref 5–15)
BUN: 17 mg/dL (ref 6–20)
CALCIUM: 7.8 mg/dL — AB (ref 8.9–10.3)
CO2: 27 mmol/L (ref 22–32)
CREATININE: 0.55 mg/dL — AB (ref 0.61–1.24)
Chloride: 99 mmol/L — ABNORMAL LOW (ref 101–111)
GFR calc non Af Amer: >60 mL/min (ref >60)
Glucose, Bld: 103 mg/dL — ABNORMAL HIGH (ref 65–99)
Potassium: 3.6 mmol/L (ref 3.5–5.1)
Sodium: 137 mmol/L (ref 135–145)

## 2017-02-22 LAB — APTT
APTT: 65 s — AB (ref 24–36)
aPTT: 58 seconds — ABNORMAL HIGH (ref 24–36)

## 2017-02-22 LAB — COOXEMETRY PANEL
Carboxyhemoglobin: 2.3 % — ABNORMAL HIGH (ref 0.5–1.5)
Methemoglobin: 0.6 % (ref 0.0–1.5)
O2 SAT: 61.7 %
TOTAL HEMOGLOBIN: 12.2 g/dL (ref 12.0–16.0)

## 2017-02-22 LAB — VANCOMYCIN, TROUGH: Vancomycin Tr: 19 ug/mL (ref 15–20)

## 2017-02-22 MED ORDER — POTASSIUM CHLORIDE CRYS ER 20 MEQ PO TBCR
40.0000 meq | EXTENDED_RELEASE_TABLET | Freq: Once | ORAL | Status: AC
  Administered 2017-02-22: 40 meq via ORAL
  Filled 2017-02-22: qty 2

## 2017-02-22 NOTE — Progress Notes (Signed)
Progress Note  Patient Name: Stephen Fleming Date of Encounter: 02/22/2017  Primary Cardiologist: Charlton Haws, MD   Subjective   Breathing getting better.  He did not sleep well overnight due to staff disruptions.   Inpatient Medications    Scheduled Meds: . amiodarone  200 mg Oral BID  . atorvastatin  40 mg Oral q1800  . digoxin  0.125 mg Oral Daily  . feeding supplement (ENSURE ENLIVE)  237 mL Oral QID  . folic acid  1 mg Oral Daily  . furosemide  40 mg Oral Daily  . losartan  12.5 mg Oral BID  . mometasone-formoterol  2 puff Inhalation BID  . multivitamin with minerals  1 tablet Oral Daily  . pantoprazole (PROTONIX) IV  40 mg Intravenous Q12H  . QUEtiapine  25 mg Oral QHS  . sodium chloride flush  10-40 mL Intracatheter Q12H  . sodium chloride flush  3 mL Intravenous Q12H  . spironolactone  25 mg Oral Daily  . thiamine  100 mg Oral Daily   Continuous Infusions: . ceFEPime (MAXIPIME) IV Stopped (02/22/17 0615)  . heparin 1,450 Units/hr (02/22/17 0215)  . vancomycin Stopped (02/22/17 0903)   PRN Meds: acetaminophen **OR** acetaminophen, albuterol, diazepam, ondansetron **OR** ondansetron (ZOFRAN) IV, sodium chloride flush, traMADol   Vital Signs    Vitals:   02/22/17 0423 02/22/17 0737 02/22/17 0852 02/22/17 1154  BP:   98/74 98/76  Pulse:   91 95  Resp:   (!) 27 (!) 24  Temp:   98.2 F (36.8 C) 97.7 F (36.5 C)  TempSrc:   Oral Oral  SpO2:  94% 96% 96%  Weight: 167 lb 8.8 oz (76 kg)     Height:        Intake/Output Summary (Last 24 hours) at 02/22/2017 1305 Last data filed at 02/22/2017 0423 Gross per 24 hour  Intake 582.52 ml  Output 1375 ml  Net -792.48 ml   Filed Weights   02/20/17 0358 02/21/17 0500 02/22/17 0423  Weight: 164 lb 14.4 oz (74.8 kg) 165 lb 5.5 oz (75 kg) 167 lb 8.8 oz (76 kg)    Telemetry    Sinus rhythm.  PVCs.  - Personally Reviewed  ECG    n/a - Personally Reviewed  Physical Exam   VS:  BP 98/76 (BP Location: Left Arm)    Pulse 95   Temp 97.7 F (36.5 C) (Oral)   Resp (!) 24   Ht 5\' 10"  (1.778 m)   Wt 167 lb 8.8 oz (76 kg)   SpO2 96%   BMI 24.04 kg/m  , BMI Body mass index is 24.04 kg/m. GENERAL:  Well appearing HEENT: Pupils equal round and reactive, fundi not visualized, oral mucosa unremarkable NECK:  No jugular venous distention, waveform within normal limits, carotid upstroke brisk and symmetric, no bruits LUNGS:  Diminished L upper lung field. Otherwise clear.  HEART:  RRR.  PMI not displaced or sustained,S1 and S2 within normal limits, no S3, no S4, no clicks, no rubs, no murmurs ABD:  Flat, positive bowel sounds normal in frequency in pitch, no bruits, no rebound, no guarding, no midline pulsatile mass, no hepatomegaly, no splenomegaly EXT:  2 plus pulses throughout, no edema, no cyanosis no clubbing SKIN:  No rashes no nodules NEURO:  Cranial nerves II through XII grossly intact, motor grossly intact throughout PSYCH:  Cognitively intact, oriented to person place and time   Labs    Chemistry Recent Labs  Lab 02/20/17 0500 02/21/17 0300  02/22/17 0500  NA 132* 137 137  K 3.3* 4.2 3.6  CL 95* 99* 99*  CO2 29 27 27   GLUCOSE 262* 114* 103*  BUN 17 18 17   CREATININE 0.53* 0.55* 0.55*  CALCIUM 7.6* 8.0* 7.8*  GFRNONAA >60 >60 >60  GFRAA >60 >60 >60  ANIONGAP 8 11 11      Hematology Recent Labs  Lab 02/20/17 0500 02/21/17 0300 02/22/17 1044  WBC 23.4* 29.4* 20.7*  RBC 3.97* 4.08* 3.83*  HGB 12.5* 12.9* 11.9*  HCT 36.5* 37.6* 35.5*  MCV 91.9 92.2 92.7  MCH 31.5 31.6 31.1  MCHC 34.2 34.3 33.5  RDW 15.0 15.2 15.6*  PLT 241 320 291    Cardiac EnzymesNo results for input(s): TROPONINI in the last 168 hours. No results for input(s): TROPIPOC in the last 168 hours.   BNPNo results for input(s): BNP, PROBNP in the last 168 hours.   DDimer No results for input(s): DDIMER in the last 168 hours.   Radiology    Ct Image Guided Drainage By Percutaneous Catheter  Result Date:  02/21/2017 INDICATION: Admitted with pulmonary embolism, now with worsening extensive heterogeneous/consolidative opacities within the left lung with associated partially loculated left-sided pleural effusion. Please perform CT-guided left-sided chest tube placement for infection source control purposes. EXAM: CT IMAGE GUIDED DRAINAGE BY PERCUTANEOUS CATHETER COMPARISON:  None. MEDICATIONS: The patient is currently admitted to the hospital and receiving intravenous antibiotics. The antibiotics were administered within an appropriate time frame prior to the initiation of the procedure. ANESTHESIA/SEDATION: Moderate (conscious) sedation was employed during this procedure. A total of Versed 0.5 mg and Fentanyl 25 mcg was administered intravenously. Moderate Sedation Time: 21 minutes. The patient's level of consciousness and vital signs were monitored continuously by radiology nursing throughout the procedure under my direct supervision. CONTRAST:  None COMPLICATIONS: None immediate. PROCEDURE: Informed written consent was obtained from the patient after a discussion of the risks, benefits and alternatives to treatment. The patient was placed supine on the CT gantry and a pre procedural CT was performed re-demonstrating the known moderate-sized partially loculated left-sided pleural effusion the procedure was planned. A timeout was performed prior to the initiation of the procedure. The skin along the left inferolateral chest wall was prepped and draped in the usual sterile fashion. The overlying soft tissues were anesthetized with 1% lidocaine with epinephrine. Appropriate trajectory was planned with the use of a 22 gauge spinal needle. An 18 gauge trocar needle was advanced into the left pleural space and a short Amplatz super stiff wire was coiled within the left pleural space. Appropriate positioning was confirmed with a limited CT scan. The tract was serially dilated allowing placement of a 10 [place] all-purpose  drainage catheter. Appropriate positioning was confirmed with a limited postprocedural CT scan. Approximately 240 cc slightly dark brown serous fluid was aspirated. The tube was connected to a pleural vac and sutured in place. A dressing was placed. The patient tolerated the procedure well without immediate post procedural complication. IMPRESSION: Successful CT guided placement of a 10 French all purpose drain catheter into the left pleural space with aspiration of 240 cc of slightly dark brown serous pleural fluid. Samples were sent to the laboratory as requested by the ordering clinical team. Electronically Signed   By: Simonne Come M.D.   On: 02/21/2017 10:26    Cardiac Studies   Echo 02/11/17: Study Conclusions  - Left ventricle: The cavity size was moderately dilated. Wall   thickness was normal. The estimated ejection fraction was  in the   range of 10% to 15%. Diffuse hypokinesis. The study is not   technically sufficient to allow evaluation of LV diastolic   function. - Regional wall motion abnormality: Akinesis of the basal-mid   anteroseptal myocardium. - Aortic valve: There was mild regurgitation. Valve area (VTI):   3.88 cm^2. Valve area (Vmax): 3.22 cm^2. Valve area (Vmean): 3.23   cm^2. - Mitral valve: Mildly calcified annulus. There was mild   regurgitation. - Left atrium: The atrium was severely dilated. - Right ventricle: The ventricular septum is flattend in systole   and diastole consistent with RV pressure and volume overload.   Systolic function was moderately reduced. TAPSE: 12.6 mm . - Right atrium: There is a semimobile linear structure in the right   atrium that likely represents a thrombus. The atrium was   moderately dilated. - Atrial septum: No defect or patent foramen ovale was identified. - Tricuspid valve: There was moderate regurgitation. - Pulmonary arteries: Systolic pressure was moderately increased.   PA peak pressure: 46 mm Hg (S). - Inferior vena  cava: The vessel was dilated. The respirophasic   diameter changes were blunted (< 50%), consistent with elevated   central venous pressure.  Patient Profile     62 y.o. male with chronic systolic and diastolic heart failure, prior alcohol abuse, nonobstructive CAD, hypertension, OSA intolerant to CPAP admitted with DVT, pulmonary embolism, PAF and acute on chronic systolic and diastolic heart failure.  Assessment & Plan    # Bilateral DVT/PE with pulmonary infarction: Thrombus noted in the right atrium on echo this admission.  Bilteral DVT/PE.  He has continued hemoptysis.  Eliquis currently on hold due to his thoracentesis and drain placement yesterday.  He is currently on heparin.  # Acute on chronic systolic and diastolic heart failure: Nonischemic.  Thought to possibly be due to prior heavy alcohol abuse.  LVEF 10-15% this admission with reduced systolic function.  Evidence of RV pressure/volume overload on echo.  Low output heart failure by right heart cath 11/2016.  Co-ox 61.7 today.  Plans for cardiac MRI this admission prior to discharge.  He was net -1L yesterday.  Renal function stable.  Continue digoxin, losartan, spironolactone, and Lasix.  BP is low.  We will add beta-blocker once stable.  # Nonobstructive CAD: Left heart cath 11/2016 revealed diffuse CAD but nonobstructive.  It is out of proportion to his cardiomyopathy.  # Atrial fibrillation with rapid ventricular response:  Continue amiodarone.  Eliquis has been switched to heparin with plans for thoracentesis.  # Loculated L pleural effusion.  He underwent CT guided drainage on 02/21/17.  240 mL's of dark, serous pleural effusion fluid were removed.  A chest tube was placed.  If it recovered accumulates or there is a lot of drainage she will need VATS.  # Dispo: To SNF when stable.   For questions or updates, please contact [facility] Please consult [email or web address] for contact info under Cardiology/STEMI.       Signed, Chilton Si, MD  02/22/2017, 1:05 PM

## 2017-02-22 NOTE — Progress Notes (Signed)
PROGRESS NOTE    Stephen Fleming  WUJ:[id number] DOB: 23-Apr-1955 DOA: 02/10/2017 PCP: Louie Boston., MD    Brief Narrative: Stephen Fleming is a 62 y.o. male with a history of chronic HFrEF, alcohol abuse (stopped Nov 2018), CAD [facility] Nov 2018 showed diffuse stenoses managed medically), HTN, OSA intolerant of CPAP, and gout who presented to [facility] 1/21 with bilateral leg pain, hemoptysis, severe dyspnea on exertion, found to have bilateral LE DVT's and acute submassive PE on CTA with RV/LV of 1, right heart strain noted on TTE. He was placed on heparin, somewhat hypotensive and was transferred to [facility] for consideration of thrombolytics. No thrombolytics were indicated. Heart failure team was consulted for acute systolic LV and right heart failure. He experienced PAF with RVR since returning to [facility] on amiodarone.    Assessment & Plan:   Active Problems:   Acute on chronic systolic CHF (congestive heart failure) (HCC)   Dyspnea   Chronic combined systolic and diastolic CHF (congestive heart failure) (HCC)   DVT of lower extremity, bilateral (HCC)   Hemoptysis   Acute pulmonary embolism (HCC)   Occult blood in stools   Long term current use of anticoagulant therapy   Malnutrition of moderate degree   Pulmonary infiltrate   Pleural effusion on left   1-Acute hypoxic respiratory failure: Due to PE, acute CHF.  Treated with IV heparin, IV lasix.  Stable. On RA<  CT chest with left lobe infection, vs infarct, loculated effusion.  Underwent CT guide thoracentesis 2-0, follow  Pleural fluid culture results pending.   2-Acute submassive PE with right heart strain and bilateral acute DVTs: Suspect LLL atelectasis due to pulmonary infarct.  Was treated with IV heparin. Transition to Eliquis on 02-17-2017.   Evaluated by CCM and IR. No EKOS. If hemoptysis get worse , could consider IVC filter.  Pulmonary following.  Continue with heparin. Would continue with heparin for now, until we can make sure no  further procedure is required.   Acute on chronic systolic CHF: EF worsened 20-25% -> 10-15%. Possibly related to chronic EtOH in addition to medically managed CAD. HF team following.  On oral lasix. Started on cozaar, spironolactone.   PNA; vs lung infarct. Leukocytosis; chest x ray with worsening infiltrates PNA vs infarct. Started IV antibiotics. CCM consulted again.  Will continue with IV antibiotics due to leukocytosis, ct with consolidation and loculated pleural effusion. Underwent CT guide thoracentesis. Wbc continue to increases.  Vancomycin and cefepime day 4.    PAF with RVR:  On digoxin, amiodarone and eliquis.   CAD w/LBBB: LHC Nov 2018 > medical management. - Continue statin, anticoagulation   ? Possible GI bleeding: Rusty stool +FOBT 1/26 due to occult GIB vs. swallowed hemoptysis. +FOBT. GI consulted, though symptoms resolved.  Hb has remain stable.  Will consider NM tagged RBC scan if gross GI bleed.  GI Consulted, Dr Loreta Ave, evaluated patient. Occult blood positive might be related to hemoptysis.    Right atrial thrombus:  - Transitioned to eliquis as above.   EtOH abuse: Not recently. CIWA discontinued without evidence of withdrawal. - Continue supplements   Moderate protein calorie malnutrition:  - RD consulted, supplements as ordered.      DVT prophylaxis: Eliquis  Code Status: full code.  Family Communication: care discussed with patient  Disposition Plan: Needs SNF, [facility].    Consultants:   Cardiology   PCCM primary through 1/24      Procedures:  Echo1/22/2019 Left ventricle: The  cavity size was moderately dilated. Wall thickness was normal. The estimated ejection fraction was in the range of 10% to 15%. Diffuse hypokinesis. The study is not technically sufficient to allow evaluation of LV diastolic function. - Regional wall motion abnormality: Akinesis of the basal-mid anteroseptal myocardium. - Aortic  valve: There was mild regurgitation.  - Mitral valve: Mildly calcified annulus. There was mild regurgitation. - Left atrium: The atrium was severely dilated. - Right ventricle: The ventricular septum is flattend in systole and diastole consistent with RV pressure and volume overload. Systolic function was moderately reduced. TAPSE: 12.6 mm . - Right atrium: There is a semimobile linear structure in the right atrium that likely represents a thrombus. The atrium was moderately dilated. - Atrial septum: No defect or patent foramen ovale was identified. - Tricuspid valve: There was moderate regurgitation. - Pulmonary arteries: Systolic pressure was moderately increased. PA peak pressure: 46 mm Hg (S). - Inferior vena cava: The vessel was dilated. The respirophasic diameter changes were blunted (<50%), consistent with elevated central venous pressure.  ECHO 11/2016 EF 20-25% RV mild/mod dilated. RV normal.   LHC/RHC 12/04/2016  Prox RCA to Mid RCA lesion is 30% stenosed.  RPDA lesion is 50% stenosed.  Mid LAD lesion is 25% stenosed.  Prox LAD lesion is 20% stenosed.  Ost 1st Diag lesion is 60% stenosed.  Ost 1st Mrg lesion is 95% stenosed.  Ost 2nd Mrg lesion is 70% stenosed.  Hemodynamic findings consistent with moderate pulmonary hypertension. 1. CAD with 60% first diagonal, 95% ostial small OM1, 70% OM2, and 50% mid PDA 2. Moderate pulmonary HTN with mean PAP 37 mm Hg 3. Moderately elevated LV filling pressures with PCWP of 29 mm Hg     Antimicrobials:  none   Subjective: He is sleepy, but answer questions,. He couldn't sleep last night. Just cough blood. Hemoptysis the same.  2170 cc drainage per catheter  Objective: Vitals:   02/21/17 2319 02/22/17 0315 02/22/17 0423 02/22/17 0737  BP: (!) 86/69 91/65    Pulse: (!) 107 100    Resp: (!) 26 (!) 22    Temp: 98.4 F (36.9 C) 98.2 F (36.8 C)    TempSrc: Axillary Oral    SpO2: 94% 92%   94%  Weight:   76 kg (167 lb 8.8 oz)   Height:        Intake/Output Summary (Last 24 hours) at 02/22/2017 0747 Last data filed at 02/22/2017 0423 Gross per 24 hour  Intake 1059.52 ml  Output 2035 ml  Net -975.48 ml   Filed Weights   02/20/17 0358 02/21/17 0500 02/22/17 0423  Weight: 74.8 kg (164 lb 14.4 oz) 75 kg (165 lb 5.5 oz) 76 kg (167 lb 8.8 oz)    Examination:  General exam: Chronic ill appearing.  Respiratory system: Bilateral crackles, drainage catheter in place.  Cardiovascular system:  S 1, S 2 RRR Gastrointestinal system: BS present soft , nt Central nervous system; alert, generalized weakness,  Extremities:  Trace edema.  Skin: no rash     Data Reviewed: I have personally reviewed following labs and imaging studies  CBC: Recent Labs  Lab 02/17/17 0425 02/18/17 0531 02/19/17 0334 02/20/17 0500 02/21/17 0300  WBC 20.4* 20.4* 23.7* 23.4* 29.4*  HGB 12.4* 11.8* 12.0* 12.5* 12.9*  HCT 36.5* 34.2* 35.1* 36.5* 37.6*  MCV 92.4 91.9 91.9 91.9 92.2  PLT 196 198 244 241 320   Basic Metabolic Panel: Recent Labs  Lab 02/18/17 0531 02/19/17 0334 02/20/17 0500 02/21/17  0300 02/22/17 0500  NA 133* 135 132* 137 137  K 3.3* 3.2* 3.3* 4.2 3.6  CL 92* 94* 95* 99* 99*  CO2 26 29 29 27 27   GLUCOSE 276* 121* 262* 114* 103*  BUN 17 18 17 18 17   CREATININE 0.62 0.54* 0.53* 0.55* 0.55*  CALCIUM 7.8* 7.9* 7.6* 8.0* 7.8*   GFR: Estimated Creatinine Clearance: 98.9 mL/min (A) (by C-G formula based on SCr of 0.55 mg/dL (L)). Liver Function Tests: No results for input(s): AST, ALT, ALKPHOS, BILITOT, PROT, ALBUMIN in the last 168 hours. No results for input(s): LIPASE, AMYLASE in the last 168 hours. No results for input(s): AMMONIA in the last 168 hours. Coagulation Profile: Recent Labs  Lab 02/16/17 0731 02/21/17 0300  INR 1.81 1.75   Cardiac Enzymes: No results for input(s): CKTOTAL, CKMB, CKMBINDEX, TROPONINI in the last 168 hours. BNP (last 3 results) No  results for input(s): PROBNP in the last 8760 hours. HbA1C: No results for input(s): HGBA1C in the last 72 hours. CBG: Recent Labs  Lab 02/17/17 0332 02/17/17 0712 02/18/17 0826 02/19/17 0814 02/20/17 0837  GLUCAP 109* 105* 137* 90 103*   Lipid Profile: No results for input(s): CHOL, HDL, LDLCALC, TRIG, CHOLHDL, LDLDIRECT in the last 72 hours. Thyroid Function Tests: No results for input(s): TSH, T4TOTAL, FREET4, T3FREE, THYROIDAB in the last 72 hours. Anemia Panel: No results for input(s): VITAMINB12, FOLATE, FERRITIN, TIBC, IRON, RETICCTPCT in the last 72 hours. Sepsis Labs: No results for input(s): PROCALCITON, LATICACIDVEN in the last 168 hours.  Recent Results (from the past 240 hour(s))  Aerobic/Anaerobic Culture (surgical/deep wound)     Status: None (Preliminary result)   Collection Time: 02/21/17  9:49 AM  Result Value Ref Range Status   Specimen Description ABSCESS LEFT PLEURAL  Final   Special Requests Normal  Final   Gram Stain   Final    FEW WBC PRESENT, PREDOMINANTLY PMN NO ORGANISMS SEEN Performed at [facility], [address] [place], [place] [address]    Culture PENDING  Incomplete   Report Status PENDING  Incomplete         Radiology Studies: Ct Image Guided Drainage By Percutaneous Catheter  Result Date: 02/21/2017 INDICATION: Admitted with pulmonary embolism, now with worsening extensive heterogeneous/consolidative opacities within the left lung with associated partially loculated left-sided pleural effusion. Please perform CT-guided left-sided chest tube placement for infection source control purposes. EXAM: CT IMAGE GUIDED DRAINAGE BY PERCUTANEOUS CATHETER COMPARISON:  None. MEDICATIONS: The patient is currently admitted to the hospital and receiving intravenous antibiotics. The antibiotics were administered within an appropriate time frame prior to the initiation of the procedure. ANESTHESIA/SEDATION: Moderate (conscious) sedation was  employed during this procedure. A total of Versed 0.5 mg and Fentanyl 25 mcg was administered intravenously. Moderate Sedation Time: 21 minutes. The patient's level of consciousness and vital signs were monitored continuously by radiology nursing throughout the procedure under my direct supervision. CONTRAST:  None COMPLICATIONS: None immediate. PROCEDURE: Informed written consent was obtained from the patient after a discussion of the risks, benefits and alternatives to treatment. The patient was placed supine on the CT gantry and a pre procedural CT was performed re-demonstrating the known moderate-sized partially loculated left-sided pleural effusion the procedure was planned. A timeout was performed prior to the initiation of the procedure. The skin along the left inferolateral chest wall was prepped and draped in the usual sterile fashion. The overlying soft tissues were anesthetized with 1% lidocaine with epinephrine. Appropriate trajectory was planned  with the use of a 22 gauge spinal needle. An 18 gauge trocar needle was advanced into the left pleural space and a short Amplatz super stiff wire was coiled within the left pleural space. Appropriate positioning was confirmed with a limited CT scan. The tract was serially dilated allowing placement of a 10 [place] all-purpose drainage catheter. Appropriate positioning was confirmed with a limited postprocedural CT scan. Approximately 240 cc slightly dark brown serous fluid was aspirated. The tube was connected to a pleural vac and sutured in place. A dressing was placed. The patient tolerated the procedure well without immediate post procedural complication. IMPRESSION: Successful CT guided placement of a 10 French all purpose drain catheter into the left pleural space with aspiration of 240 cc of slightly dark brown serous pleural fluid. Samples were sent to the laboratory as requested by the ordering clinical team. Electronically Signed   By: Simonne Come M.D.    On: 02/21/2017 10:26        Scheduled Meds: . amiodarone  200 mg Oral BID  . atorvastatin  40 mg Oral q1800  . digoxin  0.125 mg Oral Daily  . feeding supplement (ENSURE ENLIVE)  237 mL Oral QID  . folic acid  1 mg Oral Daily  . furosemide  40 mg Oral Daily  . losartan  12.5 mg Oral BID  . mometasone-formoterol  2 puff Inhalation BID  . multivitamin with minerals  1 tablet Oral Daily  . pantoprazole (PROTONIX) IV  40 mg Intravenous Q12H  . potassium chloride  40 mEq Oral Once  . QUEtiapine  25 mg Oral QHS  . sodium chloride flush  10-40 mL Intracatheter Q12H  . sodium chloride flush  3 mL Intravenous Q12H  . spironolactone  25 mg Oral Daily  . thiamine  100 mg Oral Daily   Continuous Infusions: . ceFEPime (MAXIPIME) IV Stopped (02/22/17 0615)  . heparin 1,450 Units/hr (02/22/17 0215)  . vancomycin 750 mg (02/22/17 0632)     LOS: 11 days    Time spent: 35 minutes.     Alba Cory, MD [facility] Pager [phone]  If 7PM-7AM, please contact night-coverage [email or web address] Password [facility] 02/22/2017, 7:47 AM

## 2017-02-22 NOTE — Progress Notes (Signed)
ANTICOAGULATION CONSULT NOTE - Follow Up Consult  Pharmacy Consult for heparin >> apixaban >>heparin infusion Indication: pulmonary embolus  No Known Allergies  Patient Measurements: Height: 5\' 10"  (177.8 cm) Weight: 167 lb 8.8 oz (76 kg) IBW/kg (Calculated) : 73 HEPARIN DW (KG): 77.2  Vital Signs: Temp: 98.2 F (36.8 C) (02/02 0852) Temp Source: Oral (02/02 0852) BP: 98/74 (02/02 0852) Pulse Rate: 91 (02/02 0852)  Labs: Recent Labs    02/20/17 0500 02/21/17 0235  02/21/17 0300 02/21/17 2100 02/21/17 2139 02/22/17 0021 02/22/17 0500 02/22/17 1044  HGB 12.5*  --   --  12.9*  --   --   --   --  11.9*  HCT 36.5*  --   --  37.6*  --   --   --   --  35.5*  PLT 241  --   --  320  --   --   --   --  291  APTT  --   --    < > 80*  --  >200* 58*  --  65*  LABPROT  --   --   --  20.3*  --   --   --   --   --   INR  --   --   --  1.75  --   --   --   --   --   HEPARINUNFRC  --  >2.20*  --   --  >2.20*  --   --   --  0.67  CREATININE 0.53*  --   --  0.55*  --   --   --  0.55*  --    < > = values in this interval not displayed.    Estimated Creatinine Clearance: 98.9 mL/min (A) (by C-G formula based on SCr of 0.55 mg/dL (L)).  Assessment: 60 yoM presents with PE/DVT initially started on IV heparin. Briefly transitioned to apixaban but back on IV heparin due to concern for GIB. Once stabilized, transitioned back to apixaban. CCM consulted for worsening LUL infiltrate. CT chest showed moderate to large loculated pleural effusion. Apixaban stopped (last dose 1/31 at 0916) and he was switched back to heparin pending thoracentesis. S/P thoracentesis 2/1 and heparin resumed.  Repeat aPTT slightly subtherapeutic after rate increase at 65 seconds, heparin level still being affected by recent apixaban. CBC stable, 110 cc chest tube output overnight.  Goal of Therapy:  Heparin level: 0.3-0.7 APTT level: 66-102 sec Monitor platelets by anticoagulation protocol: Yes   Plan:  -Increase  heparin to 1550 units/hr -Recheck aPTT/heparin level with am labs - will likely begin correlating soon -Monitor chest tube output, CBC, S/Sx bleeding -F/U transition back to apixaban  Fredonia Highland, PharmD, BCPS PGY-2 Cardiology Pharmacy Resident Pager: [phone] 02/22/2017

## 2017-02-22 NOTE — Progress Notes (Signed)
ANTICOAGULATION CONSULT NOTE - Follow Up Consult  Pharmacy Consult for heparin >> apixaban >>heparin infusion Indication: pulmonary embolus  No Known Allergies  Patient Measurements: Height: 5\' 10"  (177.8 cm) Weight: 165 lb 5.5 oz (75 kg) IBW/kg (Calculated) : 73 HEPARIN DW (KG): 77.2  Vital Signs: Temp: 98.4 F (36.9 C) (02/01 2319) Temp Source: Axillary (02/01 2319) BP: 86/69 (02/01 2319) Pulse Rate: 107 (02/01 2319)  Labs: Recent Labs    02/19/17 0334 02/20/17 0500 02/21/17 0235 02/21/17 0300 02/21/17 2100 02/21/17 2139 02/22/17 0021  HGB 12.0* 12.5*  --  12.9*  --   --   --   HCT 35.1* 36.5*  --  37.6*  --   --   --   PLT 244 241  --  320  --   --   --   APTT  --   --   --  80*  --  >200* 58*  LABPROT  --   --   --  20.3*  --   --   --   INR  --   --   --  1.75  --   --   --   HEPARINUNFRC  --   --  >2.20*  --  >2.20*  --   --   CREATININE 0.54* 0.53*  --  0.55*  --   --   --     Estimated Creatinine Clearance: 98.9 mL/min (A) (by C-G formula based on SCr of 0.55 mg/dL (L)).  Assessment: 58 yoM presents with PE/DVT initially started on IV heparin. Briefly transitioned to apixaban but back on IV heparin due to concern for GIB. Once stabilized, transitioned back to apixaban.   CCM consulted for worsening LUL infiltrate. CT chest showed moderate to large loculated pleural effusion. Apixaban stopped (last dose 1/31 at 0916) and he was switched back to heparin pending thoracentesis. S/P thoracentesis 2/1 AM. Per IR, ok to resume heparin at 3pm on 2/1.   2/2 AM: aPTT sub-therapeutic after re-start s/p procedure, currently using aPTT to dose given apixaban influence on anti-Xa levels  Goal of Therapy:  Heparin level: 0.3-0.7 APTT level: 66-102 sec Monitor platelets by anticoagulation protocol: Yes   Plan:  Inc heparin to 1450 units/hr 1100 heparin level and aPTT  Abran Duke, PharmD, BCPS Clinical Pharmacist Phone: [phone]

## 2017-02-22 NOTE — NC FL2 (Signed)
[place] MEDICAID FL2 LEVEL OF CARE SCREENING TOOL     IDENTIFICATION  Patient Name: Stephen Fleming Birthdate: [birth year] Sex: male Admission Date (Current Location): 02/10/2017  [facility] and [place] Number:  Producer, television/film/video and Address:  [facility], [address], [place], [place] [address]      Provider Number: [id number]  Attending Physician Name and Address:  Alba Cory, MD  Relative Name and Phone Number:       Current Level of Care:   Recommended Level of Care: Skilled Nursing Facility Prior Approval Number:    Date Approved/Denied:   PASRR Number: [id number]  Discharge Plan: SNF    Current Diagnoses: Patient Active Problem List   Diagnosis Date Noted  . Pleural effusion on left   . Pulmonary infiltrate   . Malnutrition of moderate degree 02/18/2017  . Occult blood in stools   . Long term current use of anticoagulant therapy   . Chronic combined systolic and diastolic CHF (congestive heart failure) (HCC) 02/11/2017  . DVT of lower extremity, bilateral (HCC) 02/11/2017  . Hemoptysis 02/11/2017  . Acute pulmonary embolism (HCC) 02/11/2017  . Orthostatic hypotension 01/01/2017  . ETOH abuse 12/17/2016  . Mitral regurgitation 12/06/2016  . CAD (coronary artery disease) 12/06/2016  . Essential (primary) hypertension 12/04/2016  . Acute on chronic systolic CHF (congestive heart failure) (HCC) 12/03/2016  . Dyspnea 12/03/2016  . Systolic CHF with reduced left ventricular function, NYHA class 3 (HCC) 12/03/2016  . Asthma 12/03/2016    Orientation RESPIRATION BLADDER Height & Weight     Time, Situation, Place, Self  Normal Continent Weight: 167 lb 8.8 oz (76 kg) Height:  5\' 10"  (177.8 cm)  BEHAVIORAL SYMPTOMS/MOOD NEUROLOGICAL BOWEL NUTRITION STATUS      Continent (Please see d/c summary)  AMBULATORY STATUS COMMUNICATION OF NEEDS Skin   Limited Assist Verbally Surgical wounds(Closed incision left chest.)                     Personal Care Assistance Level of Assistance  Bathing, Feeding, Dressing Bathing Assistance: Limited assistance Feeding assistance: Independent Dressing Assistance: Limited assistance     Functional Limitations Info  Sight, Hearing, Speech Sight Info: Adequate Hearing Info: Adequate Speech Info: Adequate    SPECIAL CARE FACTORS FREQUENCY  PT (By licensed PT), OT (By licensed OT)     PT Frequency: 3x OT Frequency: 3x            Contractures Contractures Info: Not present    Additional Factors Info  Code Status, Allergies Code Status Info: Full Allergies Info: No known allergies           Current Medications (02/22/2017):  This is the current hospital active medication list Current Facility-Administered Medications  Medication Dose Route Frequency Provider Last Rate Last Dose  . acetaminophen (TYLENOL) tablet 650 mg  650 mg Oral Q6H PRN Haydee Salter, MD   650 mg at 02/14/17 0145   Or  . acetaminophen (TYLENOL) suppository 650 mg  650 mg Rectal Q6H PRN Haydee Salter, MD      . albuterol (PROVENTIL) (2.5 MG/3ML) 0.083% nebulizer solution 2.5 mg  2.5 mg Nebulization Q2H PRN Haydee Salter, MD      . amiodarone (PACERONE) tablet 200 mg  200 mg Oral BID Laurey Morale, MD   200 mg at 02/21/17 2122  . atorvastatin (LIPITOR) tablet 40 mg  40 mg Oral q1800 Laurey Morale, MD   40 mg  at 02/21/17 1659  . ceFEPIme (MAXIPIME) 2 g in dextrose 5 % 50 mL IVPB  2 g Intravenous Q8H Regalado, Belkys A, MD   Stopped at 02/22/17 0615  . diazepam (VALIUM) tablet 2 mg  2 mg Oral BID PRN John Giovanni, MD   2 mg at 02/20/17 2047  . digoxin (LANOXIN) tablet 0.125 mg  0.125 mg Oral Daily Clegg, Amy D, NP   0.125 mg at 02/21/17 1022  . feeding supplement (ENSURE ENLIVE) (ENSURE ENLIVE) liquid 237 mL  237 mL Oral QID Tyrone Nine, MD   237 mL at 02/21/17 2000  . folic acid (FOLVITE) tablet 1 mg  1 mg Oral Daily Hammonds, Curt Jews, MD   1 mg at 02/21/17 1022  .  furosemide (LASIX) tablet 40 mg  40 mg Oral Daily Laurey Morale, MD   40 mg at 02/21/17 1023  . heparin ADULT infusion 100 units/mL (25000 units/226mL sodium chloride 0.45%)  1,450 Units/hr Intravenous Continuous Stevphen Rochester, RPH 14.5 mL/hr at 02/22/17 0215 1,450 Units/hr at 02/22/17 0215  . losartan (COZAAR) tablet 12.5 mg  12.5 mg Oral BID Laurey Morale, MD   12.5 mg at 02/21/17 2121  . mometasone-formoterol (DULERA) 200-5 MCG/ACT inhaler 2 puff  2 puff Inhalation BID Haydee Salter, MD   2 puff at 02/22/17 [phone]  . multivitamin with minerals tablet 1 tablet  1 tablet Oral Daily Hammonds, Curt Jews, MD   1 tablet at 02/21/17 1023  . ondansetron (ZOFRAN) tablet 4 mg  4 mg Oral Q6H PRN Haydee Salter, MD       Or  . ondansetron [facility]) injection 4 mg  4 mg Intravenous Q6H PRN Haydee Salter, MD      . pantoprazole (PROTONIX) injection 40 mg  40 mg Intravenous Q12H Tyrone Nine, MD   40 mg at 02/21/17 2121  . potassium chloride SA (K-DUR,KLOR-CON) CR tablet 40 mEq  40 mEq Oral Once Bensimhon, Bevelyn Buckles, MD      . QUEtiapine (SEROQUEL) tablet 25 mg  25 mg Oral QHS Hammonds, Curt Jews, MD   25 mg at 02/21/17 2122  . sodium chloride flush (NS) 0.9 % injection 10-40 mL  10-40 mL Intracatheter Q12H Hammonds, Curt Jews, MD   10 mL at 02/20/17 0918  . sodium chloride flush (NS) 0.9 % injection 10-40 mL  10-40 mL Intracatheter PRN Hammonds, Curt Jews, MD      . sodium chloride flush (NS) 0.9 % injection 3 mL  3 mL Intravenous Q12H Haydee Salter, MD   3 mL at 02/20/17 9604  . spironolactone (ALDACTONE) tablet 25 mg  25 mg Oral Daily Laurey Morale, MD   25 mg at 02/21/17 1022  . thiamine (VITAMIN B-1) tablet 100 mg  100 mg Oral Daily Hammonds, Curt Jews, MD   100 mg at 02/21/17 1023  . traMADol (ULTRAM) tablet 50 mg  50 mg Oral Q6H PRN Tyrone Nine, MD   50 mg at 02/19/17 1838  . vancomycin (VANCOCIN) IVPB 750 mg/150 ml premix  750 mg Intravenous Q8H Regalado, Belkys A, MD    Stopped at 02/22/17 0903     Discharge Medications: Please see discharge summary for a list of discharge medications.  Relevant Imaging Results:  Relevant Lab Results:   Additional Information SSN: [id number]  Maree Krabbe, LCSW

## 2017-02-22 NOTE — Progress Notes (Signed)
Referring Physician(s): [facility] Dr Sunnie Nielsen CCM  Supervising Physician: Simonne Come  Patient Status:  [facility] - In-pt  Chief Complaint:  Left chest tube placed 2/1   Subjective:  Hx PE Left loculated pleural effusion Chest tube intact 300 cc from tube in Pleurvac No air leak Feels some better  Allergies: Patient has no known allergies.  Medications: Prior to Admission medications   Medication Sig Start Date End Date Taking? Authorizing Provider  albuterol (PROVENTIL HFA;VENTOLIN HFA) 108 (90 BASE) MCG/ACT inhaler Inhale 2 puffs into the lungs every 6 (six) hours as needed for wheezing.   Yes [provider]  aspirin 81 MG tablet Take 81 mg by mouth daily.   Yes [provider]  benzonatate (TESSALON) 200 MG capsule Take 1 capsule (200 mg total) by mouth 3 (three) times daily as needed for cough. 01/22/17  Yes Strader, [place] M, PA-C  Fluticasone-Salmeterol (ADVAIR) 250-50 MCG/DOSE AEPB Inhale 1 puff into the lungs every 12 (twelve) hours.   Yes [provider]  furosemide (LASIX) 20 MG tablet Take 1 tablet (20 mg total) by mouth daily. 01/01/17 04/01/17 Yes Dyann Kief, PA-C  losartan (COZAAR) 50 MG tablet Take 50 mg by mouth daily.   Yes [provider]  metoprolol succinate (TOPROL XL) 25 MG 24 hr tablet Take 0.5 tablets (12.5 mg total) by mouth daily. 01/22/17  Yes Strader, [place] M, PA-C  polyethylene glycol powder (GLYCOLAX/MIRALAX) powder Take 17 g daily by mouth. 08/31/16  Yes [provider]  potassium chloride SA (K-DUR,KLOR-CON) 20 MEQ tablet Take 1 tablet (20 mEq total) daily by mouth. 12/07/16  Yes Berton Bon, NP     Vital Signs: BP 98/74 (BP Location: Left Arm)   Pulse 91   Temp 98.2 F (36.8 C) (Oral)   Resp (!) 27   Ht 5\' 10"  (1.778 m)   Wt 167 lb 8.8 oz (76 kg)   SpO2 96%   BMI 24.04 kg/m   Physical Exam  Constitutional: He is oriented to person, place, and time.  Pulmonary/Chest: No respiratory  distress. He has decreased breath sounds in the left upper field.  Chest tube intact Tender site No bleeding No sign of infection No air leak 300 cc in Pleurvac---bloody  Neurological: He is alert and oriented to person, place, and time.  Nursing note and vitals reviewed.   Imaging: Ct Chest Wo Contrast  Result Date: 02/19/2017 CLINICAL DATA:  Acute respiratory illness.  Pleural effusion. EXAM: CT CHEST WITHOUT CONTRAST TECHNIQUE: Multidetector CT imaging of the chest was performed following the standard protocol without IV contrast. COMPARISON:  02/11/2017 FINDINGS: Cardiovascular: There is moderate cardiac enlargement. Aortic atherosclerosis. Calcification in the LAD and left circumflex and RCA coronary arteries noted. Mediastinum/Nodes: The trachea appears patent and is midline. Normal appearance of the esophagus. Multiple prominent, non pathologically enlarged mediastinal lymph nodes identified. No axillary or supraclavicular adenopathy. Lungs/Pleura: There is a moderate to large loculated left pleural effusion identified which is new from the previous exam. There is dense airspace consolidation involving the entire left lower lobe and the posterior left upper lobe. Mild subpleural consolidation within the posterior right middle lobe noted, image 17 of series 7. Upper Abdomen: No acute abnormality. Musculoskeletal: Degenerative disc disease identified within the thoracic spine. No aggressive lytic or sclerotic bone lesions. IMPRESSION: 1. Moderate to large loculated left pleural effusion. 2. Dense airspace consolidation involves the entire left lower lobe and posterior left upper lobe which may be the sequelae of pulmonary infarct  and/or infection. 3. Mild subpleural consolidation in the posterior right middle lobe. 4. Aortic Atherosclerosis (ICD10-I70.0). Three vessel coronary artery calcifications noted. Electronically Signed   By: Signa Kell M.D.   On: 02/19/2017 15:32   Dg Chest Port 1  View  Result Date: 02/19/2017 CLINICAL DATA:  Leukocytosis, cough and congestion. EXAM: PORTABLE CHEST 1 VIEW COMPARISON:  CT chest 02/11/2017 and chest radiograph 02/10/2017. FINDINGS: Trachea is midline. Heart is enlarged. Right PICC tip projects over the low SVC. Marked increase in airspace consolidation in the left upper and left lower lobes, with a left pleural effusion. Right lung is grossly clear. IMPRESSION: 1. Marked worsening in left upper and left lower lobe consolidation, worrisome for pneumonia. Combination of pneumonia and infarct is also considered, given recent diagnosis of pulmonary emboli. 2. Left pleural effusion. Electronically Signed   By: Leanna Battles M.D.   On: 02/19/2017 10:03   Ct Image Guided Drainage By Percutaneous Catheter  Result Date: 02/21/2017 INDICATION: Admitted with pulmonary embolism, now with worsening extensive heterogeneous/consolidative opacities within the left lung with associated partially loculated left-sided pleural effusion. Please perform CT-guided left-sided chest tube placement for infection source control purposes. EXAM: CT IMAGE GUIDED DRAINAGE BY PERCUTANEOUS CATHETER COMPARISON:  None. MEDICATIONS: The patient is currently admitted to the hospital and receiving intravenous antibiotics. The antibiotics were administered within an appropriate time frame prior to the initiation of the procedure. ANESTHESIA/SEDATION: Moderate (conscious) sedation was employed during this procedure. A total of Versed 0.5 mg and Fentanyl 25 mcg was administered intravenously. Moderate Sedation Time: 21 minutes. The patient's level of consciousness and vital signs were monitored continuously by radiology nursing throughout the procedure under my direct supervision. CONTRAST:  None COMPLICATIONS: None immediate. PROCEDURE: Informed written consent was obtained from the patient after a discussion of the risks, benefits and alternatives to treatment. The patient was placed supine  on the CT gantry and a pre procedural CT was performed re-demonstrating the known moderate-sized partially loculated left-sided pleural effusion the procedure was planned. A timeout was performed prior to the initiation of the procedure. The skin along the left inferolateral chest wall was prepped and draped in the usual sterile fashion. The overlying soft tissues were anesthetized with 1% lidocaine with epinephrine. Appropriate trajectory was planned with the use of a 22 gauge spinal needle. An 18 gauge trocar needle was advanced into the left pleural space and a short Amplatz super stiff wire was coiled within the left pleural space. Appropriate positioning was confirmed with a limited CT scan. The tract was serially dilated allowing placement of a 10 [place] all-purpose drainage catheter. Appropriate positioning was confirmed with a limited postprocedural CT scan. Approximately 240 cc slightly dark brown serous fluid was aspirated. The tube was connected to a pleural vac and sutured in place. A dressing was placed. The patient tolerated the procedure well without immediate post procedural complication. IMPRESSION: Successful CT guided placement of a 10 French all purpose drain catheter into the left pleural space with aspiration of 240 cc of slightly dark brown serous pleural fluid. Samples were sent to the laboratory as requested by the ordering clinical team. Electronically Signed   By: Simonne Come M.D.   On: 02/21/2017 10:26    Labs:  CBC: Recent Labs    02/18/17 0531 02/19/17 0334 02/20/17 0500 02/21/17 0300  WBC 20.4* 23.7* 23.4* 29.4*  HGB 11.8* 12.0* 12.5* 12.9*  HCT 34.2* 35.1* 36.5* 37.6*  PLT 198 244 241 320    COAGS: Recent Labs  12/04/16 1660  02/10/17 1852 02/16/17 0731  02/18/17 0531 02/21/17 0300 02/21/17 2139 02/22/17 0021  INR 1.13  --  1.43 1.81  --   --  1.75  --   --   APTT  --    < > 35 111*   < > 49* 80* >200* 58*   < > = values in this interval not displayed.      BMP: Recent Labs    02/19/17 0334 02/20/17 0500 02/21/17 0300 02/22/17 0500  NA 135 132* 137 137  K 3.2* 3.3* 4.2 3.6  CL 94* 95* 99* 99*  CO2 29 29 27 27   GLUCOSE 121* 262* 114* 103*  BUN 18 17 18 17   CALCIUM 7.9* 7.6* 8.0* 7.8*  CREATININE 0.54* 0.53* 0.55* 0.55*  GFRNONAA >60 >60 >60 >60  GFRAA >60 >60 >60 >60    LIVER FUNCTION TESTS: Recent Labs    02/10/17 1704 02/12/17 0327  BILITOT 3.3* 2.8*  AST 59* 40  ALT 45 35  ALKPHOS 70 63  PROT 6.8 6.2*  ALBUMIN 3.8 3.3*  3.3*    Assessment and Plan:  Left chest tube in place Functioning well Will follow   Electronically Signed: Aubree Doody A, PA-C 02/22/2017, 10:10 AM   I spent a total of 15 Minutes at the the patient's bedside AND on the patient's hospital floor or unit, greater than 50% of which was counseling/coordinating care for left chest tube

## 2017-02-22 NOTE — Progress Notes (Signed)
Pharmacy Antibiotic Note  Stephen Fleming is a 62 y.o. male admitted on 02/10/2017 with pneumonia.  Pharmacy has been consulted for vancomycin and cefepime dosing. Vancomycin trough therapeutic at 19 mcg/ml today, no changes in renal function or UOP.  Plan: -Continue vancomycin 750mg  IV q8h -Continue cefepime 2g IV q8h -Monitor renal funx, cultures, LOT  Height: 5\' 10"  (177.8 cm) Weight: 167 lb 8.8 oz (76 kg) IBW/kg (Calculated) : 73  Temp (24hrs), Avg:98 F (36.7 C), Min:97.6 F (36.4 C), Max:98.4 F (36.9 C)  Recent Labs  Lab 02/17/17 0425 02/18/17 0531 02/19/17 0334 02/20/17 0500 02/21/17 0300 02/22/17 0500 02/22/17 0505  WBC 20.4* 20.4* 23.7* 23.4* 29.4*  --   --   CREATININE 0.73 0.62 0.54* 0.53* 0.55* 0.55*  --   VANCOTROUGH  --   --   --   --   --   --  19    Estimated Creatinine Clearance: 98.9 mL/min (A) (by C-G formula based on SCr of 0.55 mg/dL (L)).    No Known Allergies  Antimicrobials this admission: Vancomycin 1/30 >>  Cefepime 1/30 >>   Dose adjustments this admission: 2/2 VT: 19 mcg/ml - no changes  Microbiology results: 1/24 MRSA PCR: positive 2/1 Pleural Cx: pending  Thank you for allowing pharmacy to be a part of this patient's care.  Fredonia Highland, PharmD, BCPS PGY-2 Cardiology Pharmacy Resident Pager: [phone] 02/22/2017

## 2017-02-23 LAB — COOXEMETRY PANEL
Carboxyhemoglobin: 2.3 % — ABNORMAL HIGH (ref 0.5–1.5)
Methemoglobin: 0.8 % (ref 0.0–1.5)
O2 SAT: 66.3 %
TOTAL HEMOGLOBIN: 11.4 g/dL — AB (ref 12.0–16.0)

## 2017-02-23 LAB — BASIC METABOLIC PANEL
Anion gap: 10 (ref 5–15)
BUN: 19 mg/dL (ref 6–20)
CHLORIDE: 101 mmol/L (ref 101–111)
CO2: 26 mmol/L (ref 22–32)
CREATININE: 0.55 mg/dL — AB (ref 0.61–1.24)
Calcium: 7.8 mg/dL — ABNORMAL LOW (ref 8.9–10.3)
GFR calc Af Amer: >60 mL/min (ref >60)
GFR calc non Af Amer: >60 mL/min (ref >60)
Glucose, Bld: 124 mg/dL — ABNORMAL HIGH (ref 65–99)
Potassium: 3.9 mmol/L (ref 3.5–5.1)
SODIUM: 137 mmol/L (ref 135–145)

## 2017-02-23 LAB — CBC
HCT: 34.8 % — ABNORMAL LOW (ref 39.0–52.0)
Hemoglobin: 11.8 g/dL — ABNORMAL LOW (ref 13.0–17.0)
MCH: 31.3 pg (ref 26.0–34.0)
MCHC: 33.9 g/dL (ref 30.0–36.0)
MCV: 92.3 fL (ref 78.0–100.0)
PLATELETS: 322 10*3/uL (ref 150–400)
RBC: 3.77 MIL/uL — ABNORMAL LOW (ref 4.22–5.81)
RDW: 15.8 % — AB (ref 11.5–15.5)
WBC: 22 10*3/uL — AB (ref 4.0–10.5)

## 2017-02-23 LAB — APTT: aPTT: 94 seconds — ABNORMAL HIGH (ref 24–36)

## 2017-02-23 LAB — HEPARIN LEVEL (UNFRACTIONATED): Heparin Unfractionated: 0.72 IU/mL — ABNORMAL HIGH (ref 0.30–0.70)

## 2017-02-23 NOTE — Progress Notes (Signed)
Progress Note  Patient Name: Stephen Fleming Date of Encounter: 02/23/2017  Primary Cardiologist: Charlton Haws, MD   Subjective   Breathing getting better. Continues to have some hemoptysis.    Inpatient Medications    Scheduled Meds: . amiodarone  200 mg Oral BID  . atorvastatin  40 mg Oral q1800  . digoxin  0.125 mg Oral Daily  . feeding supplement (ENSURE ENLIVE)  237 mL Oral QID  . folic acid  1 mg Oral Daily  . furosemide  40 mg Oral Daily  . losartan  12.5 mg Oral BID  . mometasone-formoterol  2 puff Inhalation BID  . multivitamin with minerals  1 tablet Oral Daily  . pantoprazole (PROTONIX) IV  40 mg Intravenous Q12H  . QUEtiapine  25 mg Oral QHS  . sodium chloride flush  10-40 mL Intracatheter Q12H  . sodium chloride flush  3 mL Intravenous Q12H  . spironolactone  25 mg Oral Daily  . thiamine  100 mg Oral Daily   Continuous Infusions: . ceFEPime (MAXIPIME) IV Stopped (02/23/17 0548)  . heparin 1,550 Units/hr (02/23/17 1146)  . vancomycin 750 mg (02/23/17 0548)   PRN Meds: acetaminophen **OR** acetaminophen, albuterol, diazepam, ondansetron **OR** ondansetron (ZOFRAN) IV, sodium chloride flush, traMADol   Vital Signs    Vitals:   02/23/17 0500 02/23/17 0803 02/23/17 0816 02/23/17 1227  BP:   98/70 105/74  Pulse:   88 93  Resp:   (!) 23 (!) 28  Temp:   (!) 97.5 F (36.4 C) 97.8 F (36.6 C)  TempSrc:   Oral Oral  SpO2:  93% 96% 96%  Weight: 171 lb 15.3 oz (78 kg)     Height:        Intake/Output Summary (Last 24 hours) at 02/23/2017 1233 Last data filed at 02/23/2017 1228 Gross per 24 hour  Intake 1183.16 ml  Output 900 ml  Net 283.16 ml   Filed Weights   02/21/17 0500 02/22/17 0423 02/23/17 0500  Weight: 165 lb 5.5 oz (75 kg) 167 lb 8.8 oz (76 kg) 171 lb 15.3 oz (78 kg)    Telemetry    Sinus rhythm.  PVCs.  - Personally Reviewed  ECG    n/a - Personally Reviewed  Physical Exam   VS:  BP 105/74 (BP Location: Left Arm)   Pulse 93   Temp  97.8 F (36.6 C) (Oral)   Resp (!) 28   Ht 5\' 10"  (1.778 m)   Wt 171 lb 15.3 oz (78 kg)   SpO2 96%   BMI 24.67 kg/m  , BMI Body mass index is 24.67 kg/m. GENERAL:  Ill-appearing HEENT: Pupils equal round and reactive, fundi not visualized, oral mucosa unremarkable NECK:  No jugular venous distention, waveform within normal limits, carotid upstroke brisk and symmetric, no bruits LUNGS:  Diminished L upper lung field. Otherwise clear.  HEART:  RRR.  PMI not displaced or sustained,S1 and S2 within normal limits, no S3, no S4, no clicks, no rubs, no murmurs ABD:  Flat, positive bowel sounds normal in frequency in pitch, no bruits, no rebound, no guarding, no midline pulsatile mass, no hepatomegaly, no splenomegaly EXT:  2 plus pulses throughout, no edema, no cyanosis no clubbing SKIN:  No rashes no nodules NEURO:  Cranial nerves II through XII grossly intact, motor grossly intact throughout PSYCH:  Cognitively intact, oriented to person place and time   Labs    Chemistry Recent Labs  Lab 02/21/17 0300 02/22/17 0500 02/23/17 0428  NA  137 137 137  K 4.2 3.6 3.9  CL 99* 99* 101  CO2 27 27 26   GLUCOSE 114* 103* 124*  BUN 18 17 19   CREATININE 0.55* 0.55* 0.55*  CALCIUM 8.0* 7.8* 7.8*  GFRNONAA >60 >60 >60  GFRAA >60 >60 >60  ANIONGAP 11 11 10      Hematology Recent Labs  Lab 02/21/17 0300 02/22/17 1044 02/23/17 0428  WBC 29.4* 20.7* 22.0*  RBC 4.08* 3.83* 3.77*  HGB 12.9* 11.9* 11.8*  HCT 37.6* 35.5* 34.8*  MCV 92.2 92.7 92.3  MCH 31.6 31.1 31.3  MCHC 34.3 33.5 33.9  RDW 15.2 15.6* 15.8*  PLT 320 291 322    Cardiac EnzymesNo results for input(s): TROPONINI in the last 168 hours. No results for input(s): TROPIPOC in the last 168 hours.   BNPNo results for input(s): BNP, PROBNP in the last 168 hours.   DDimer No results for input(s): DDIMER in the last 168 hours.   Radiology    Ct Head Wo Contrast  Result Date: 02/22/2017 CLINICAL DATA:  Altered mental status.  Speech difficulty. On anticoagulation. EXAM: CT HEAD WITHOUT CONTRAST TECHNIQUE: Contiguous axial images were obtained from the base of the skull through the vertex without intravenous contrast. COMPARISON:  Head CT 03/15/2009 and MRI 06/01/2007 FINDINGS: Brain: There is no evidence of acute infarct, intracranial hemorrhage, mass, midline shift, or extra-axial fluid collection. Mild-to-moderate cerebral atrophy has progressed from 2011 and is advanced for age. There is mild cerebellar atrophy. Vascular: Calcified atherosclerosis at the skull base. No hyperdense vessel. Skull: No fracture or focal osseous lesion. Sinuses/Orbits: Minimal scattered paranasal sinus mucosal thickening. Clear mastoid air cells. Unremarkable orbits. Other: None. IMPRESSION: 1. No evidence of acute intracranial abnormality. 2. Progressive cerebral atrophy. Electronically Signed   By: Sebastian Ache M.D.   On: 02/22/2017 15:50    Cardiac Studies   Echo 02/11/17: Study Conclusions  - Left ventricle: The cavity size was moderately dilated. Wall   thickness was normal. The estimated ejection fraction was in the   range of 10% to 15%. Diffuse hypokinesis. The study is not   technically sufficient to allow evaluation of LV diastolic   function. - Regional wall motion abnormality: Akinesis of the basal-mid   anteroseptal myocardium. - Aortic valve: There was mild regurgitation. Valve area (VTI):   3.88 cm^2. Valve area (Vmax): 3.22 cm^2. Valve area (Vmean): 3.23   cm^2. - Mitral valve: Mildly calcified annulus. There was mild   regurgitation. - Left atrium: The atrium was severely dilated. - Right ventricle: The ventricular septum is flattend in systole   and diastole consistent with RV pressure and volume overload.   Systolic function was moderately reduced. TAPSE: 12.6 mm . - Right atrium: There is a semimobile linear structure in the right   atrium that likely represents a thrombus. The atrium was   moderately  dilated. - Atrial septum: No defect or patent foramen ovale was identified. - Tricuspid valve: There was moderate regurgitation. - Pulmonary arteries: Systolic pressure was moderately increased.   PA peak pressure: 46 mm Hg (S). - Inferior vena cava: The vessel was dilated. The respirophasic   diameter changes were blunted (< 50%), consistent with elevated   central venous pressure.  Patient Profile     62 y.o. male with chronic systolic and diastolic heart failure, prior alcohol abuse, nonobstructive CAD, hypertension, OSA intolerant to CPAP admitted with DVT, pulmonary embolism, PAF and acute on chronic systolic and diastolic heart failure.  Assessment & Plan    #  Bilateral DVT/PE with pulmonary infarction: Thrombus noted in the right atrium on echo this admission.  Bilteral DVT/PE.  He has continued hemoptysis.  Eliquis currently on hold due to his thoracentesis and drain placement 02/21/17.  He remains on heparin.  # Acute on chronic systolic and diastolic heart failure: Nonischemic.  Thought to possibly be due to prior heavy alcohol abuse.  LVEF 10-15% this admission with reduced systolic function.  Evidence of RV pressure/volume overload on echo.  Low output heart failure by right heart cath 11/2016.  Co-ox 66 today.  He appears to be well-compensated at this time.  Plans for cardiac MRI this admission prior to discharge.  He is not ready to lay flat in the scanner for long periods 2/2 cough and agitation.  Renal function stable.  Continue digoxin, losartan, spironolactone, and Lasix.  BP is low.  We will add beta-blocker once stable.  # Nonobstructive CAD: Left heart cath 11/2016 revealed diffuse CAD but nonobstructive.  It is out of proportion to his cardiomyopathy.  No aspirin on Eliquis.   # Atrial fibrillation with rapid ventricular response:  Remains in sinus rhythm.  Continue amiodarone.  Eliquis has been switched to heparin with plans for thoracentesis.  # Loculated L pleural  effusion.  He underwent CT guided drainage on 02/21/17.  240 mL's of dark, serous pleural effusion fluid were removed.  A chest tube was placed.  If it recovered accumulates or there is a lot of drainage she will need VATS.  # Dispo: To SNF when stable.   For questions or updates, please contact [facility] Please consult [email or web address] for contact info under Cardiology/STEMI.      Signed, Chilton Si, MD  02/23/2017, 12:33 PM

## 2017-02-23 NOTE — Progress Notes (Signed)
ANTICOAGULATION CONSULT NOTE - Follow Up Consult  Pharmacy Consult for heparin >> apixaban >>heparin infusion Indication: pulmonary embolus  No Known Allergies  Patient Measurements: Height: 5\' 10"  (177.8 cm) Weight: 171 lb 15.3 oz (78 kg) IBW/kg (Calculated) : 73 HEPARIN DW (KG): 77.2  Vital Signs: Temp: 97.5 F (36.4 C) (02/03 0816) Temp Source: Oral (02/03 0816) BP: 98/70 (02/03 0816) Pulse Rate: 88 (02/03 0816)  Labs: Recent Labs    02/21/17 0300 02/21/17 2100  02/22/17 0021 02/22/17 0500 02/22/17 1044 02/23/17 0428 02/23/17 0431  HGB 12.9*  --   --   --   --  11.9* 11.8*  --   HCT 37.6*  --   --   --   --  35.5* 34.8*  --   PLT 320  --   --   --   --  291 322  --   APTT 80*  --    < > 58*  --  65* 94*  --   LABPROT 20.3*  --   --   --   --   --   --   --   INR 1.75  --   --   --   --   --   --   --   HEPARINUNFRC  --  >2.20*  --   --   --  0.67  --  0.72*  CREATININE 0.55*  --   --   --  0.55*  --  0.55*  --    < > = values in this interval not displayed.    Estimated Creatinine Clearance: 98.9 mL/min (A) (by C-G formula based on SCr of 0.55 mg/dL (L)).  Assessment: 51 yoM presents with PE/DVT initially started on IV heparin. Briefly transitioned to apixaban but back on IV heparin due to concern for GIB. Once stabilized, transitioned back to apixaban. CCM consulted for worsening LUL infiltrate. CT chest showed moderate to large loculated pleural effusion. Apixaban stopped (last dose 1/31 at 0916) and he was switched back to heparin pending thoracentesis. S/P thoracentesis 2/1 and heparin resumed.  AM aPTT at goal, heparin level still being slightly affected by recent apixaban. CBC stable.  Goal of Therapy:  Heparin level: 0.3-0.7 APTT level: 66-102 sec Monitor platelets by anticoagulation protocol: Yes   Plan:  -Daily heparin level and APTT - expect will be fully correlating soon. -Monitor chest tube output, CBC, S/Sx bleeding -F/U transition back to  apixaban  Tad Moore, BCPS  Clinical Pharmacist Pager [phone]  02/23/2017 9:20 AM

## 2017-02-23 NOTE — Progress Notes (Addendum)
PROGRESS NOTE    Stephen Fleming  ZOX:[id number] DOB: 1955/10/12 DOA: 02/10/2017 PCP: Louie Boston., MD    Brief Narrative: Stephen Fleming is a 62 y.o. male with a history of chronic HFrEF, alcohol abuse (stopped Nov 2018), CAD [facility] Nov 2018 showed diffuse stenoses managed medically), HTN, OSA intolerant of CPAP, and gout who presented to [facility] 1/21 with bilateral leg pain, hemoptysis, severe dyspnea on exertion, found to have bilateral LE DVT's and acute submassive PE on CTA with RV/LV of 1, right heart strain noted on TTE. He was placed on heparin, somewhat hypotensive and was transferred to [facility] for consideration of thrombolytics. No thrombolytics were indicated. Heart failure team was consulted for acute systolic LV and right heart failure. He experienced PAF with RVR since returning to [facility] on amiodarone.    Assessment & Plan:   Active Problems:   Acute on chronic systolic CHF (congestive heart failure) (HCC)   Dyspnea   Chronic combined systolic and diastolic CHF (congestive heart failure) (HCC)   DVT of lower extremity, bilateral (HCC)   Hemoptysis   Acute pulmonary embolism (HCC)   Occult blood in stools   Long term current use of anticoagulant therapy   Malnutrition of moderate degree   Pulmonary infiltrate   Loculated pleural effusion   1-Acute hypoxic respiratory failure: Due to PE, acute CHF.  Treated with IV heparin, lasix.  Stable. On RA<  CT chest with left lobe infection, vs infarct, loculated effusion.  Underwent CT guide thoracentesis 2-0, follow  Pleural fluid culture results no growth.   2-Acute submassive PE with right heart strain and bilateral acute DVTs: Suspect LLL atelectasis due to pulmonary infarct.  Was treated with IV heparin. Transition to Eliquis on 02-17-2017.   Evaluated by CCM and IR. No EKOS. If hemoptysis get worse , could consider IVC filter.  Pulmonary following.  Continue with heparin. Would continue with heparin for now, until we can make sure no  further procedure is required.    Acute on chronic systolic CHF: EF worsened 20-25% -> 10-15%. Possibly related to chronic EtOH in addition to medically managed CAD. HF team following.  On oral lasix. Started on cozaar, spironolactone.   PNA; vs lung infarct. Leukocytosis; chest x ray with worsening infiltrates PNA vs infarct. Started IV antibiotics. CCM consulted again.  CT with consolidation and loculated pleural effusion. Underwent CT guide thoracentesis.  Vancomycin and cefepime day 5.  Await pulmonologist evaluation.  Culture negative to date, await results.   PAF with RVR:  On digoxin, amiodarone and eliquis.   CAD w/LBBB: LHC Nov 2018 > medical management. - Continue statin, anticoagulation   ? Possible GI bleeding: Rusty stool +FOBT 1/26 due to occult GIB vs. swallowed hemoptysis. +FOBT. GI consulted, though symptoms resolved.  Hb has remain stable.  Will consider NM tagged RBC scan if gross GI bleed.  GI Consulted, Dr Loreta Ave, evaluated patient. Occult blood positive might be related to hemoptysis.    Right atrial thrombus:  - Transitioned to eliquis as above.   EtOH abuse: Not recently. CIWA discontinued without evidence of withdrawal. - Continue supplements   Moderate protein calorie malnutrition:  - RD consulted, supplements as ordered.   Difficulty speech; per family patient speech is different since he has been in the hospital. CT head negative. Patient neuro exam is non focal.    DVT prophylaxis: Eliquis  Code Status: full code.  Family Communication: care discussed with patient  Disposition Plan: Needs SNF, [facility]  Consultants:   Cardiology   PCCM primary through 1/24      Procedures:  Echo1/22/2019 Left ventricle: The cavity size was moderately dilated. Wall thickness was normal. The estimated ejection fraction was in the range of 10% to 15%. Diffuse hypokinesis. The study is not technically sufficient to allow  evaluation of LV diastolic function. - Regional wall motion abnormality: Akinesis of the basal-mid anteroseptal myocardium. - Aortic valve: There was mild regurgitation.  - Mitral valve: Mildly calcified annulus. There was mild regurgitation. - Left atrium: The atrium was severely dilated. - Right ventricle: The ventricular septum is flattend in systole and diastole consistent with RV pressure and volume overload. Systolic function was moderately reduced. TAPSE: 12.6 mm . - Right atrium: There is a semimobile linear structure in the right atrium that likely represents a thrombus. The atrium was moderately dilated. - Atrial septum: No defect or patent foramen ovale was identified. - Tricuspid valve: There was moderate regurgitation. - Pulmonary arteries: Systolic pressure was moderately increased. PA peak pressure: 46 mm Hg (S). - Inferior vena cava: The vessel was dilated. The respirophasic diameter changes were blunted (<50%), consistent with elevated central venous pressure.  ECHO 11/2016 EF 20-25% RV mild/mod dilated. RV normal.   LHC/RHC 12/04/2016  Prox RCA to Mid RCA lesion is 30% stenosed.  RPDA lesion is 50% stenosed.  Mid LAD lesion is 25% stenosed.  Prox LAD lesion is 20% stenosed.  Ost 1st Diag lesion is 60% stenosed.  Ost 1st Mrg lesion is 95% stenosed.  Ost 2nd Mrg lesion is 70% stenosed.  Hemodynamic findings consistent with moderate pulmonary hypertension. 1. CAD with 60% first diagonal, 95% ostial small OM1, 70% OM2, and 50% mid PDA 2. Moderate pulmonary HTN with mean PAP 37 mm Hg 3. Moderately elevated LV filling pressures with PCWP of 29 mm Hg     Antimicrobials:  none   Subjective: He is sleepy, he report hemoptysis is less frequent. Denies dyspnea.      Objective: Vitals:   02/22/17 2311 02/23/17 0000 02/23/17 0400 02/23/17 0500  BP: 99/68     Pulse: 100 98 (!) 101   Resp: (!) 28 (!) 25 (!) 22   Temp: (!)  97.5 F (36.4 C)     TempSrc: Oral     SpO2: 94% 91% 93%   Weight:    78 kg (171 lb 15.3 oz)  Height:        Intake/Output Summary (Last 24 hours) at 02/23/2017 0733 Last data filed at 02/23/2017 0548 Gross per 24 hour  Intake 1021.16 ml  Output 844 ml  Net 177.16 ml   Filed Weights   02/21/17 0500 02/22/17 0423 02/23/17 0500  Weight: 75 kg (165 lb 5.5 oz) 76 kg (167 lb 8.8 oz) 78 kg (171 lb 15.3 oz)    Examination:  General exam: Chronic ill,  Respiratory system: bilateral crackles, chest tube in place.  Cardiovascular system:  S 1, S 2 RRR Gastrointestinal system; BS present, soft, nt Central nervous system; alert, generalized weakness,  Extremities:  Trace edema.  Skin: no rash     Data Reviewed: I have personally reviewed following labs and imaging studies  CBC: Recent Labs  Lab 02/19/17 0334 02/20/17 0500 02/21/17 0300 02/22/17 1044 02/23/17 0428  WBC 23.7* 23.4* 29.4* 20.7* 22.0*  HGB 12.0* 12.5* 12.9* 11.9* 11.8*  HCT 35.1* 36.5* 37.6* 35.5* 34.8*  MCV 91.9 91.9 92.2 92.7 92.3  PLT 244 241 320 291 322   Basic Metabolic Panel: Recent Labs  Lab 02/19/17 0334 02/20/17 0500 02/21/17 0300 02/22/17 0500 02/23/17 0428  NA 135 132* 137 137 137  K 3.2* 3.3* 4.2 3.6 3.9  CL 94* 95* 99* 99* 101  CO2 29 29 27 27 26   GLUCOSE 121* 262* 114* 103* 124*  BUN 18 17 18 17 19   CREATININE 0.54* 0.53* 0.55* 0.55* 0.55*  CALCIUM 7.9* 7.6* 8.0* 7.8* 7.8*   GFR: Estimated Creatinine Clearance: 98.9 mL/min (A) (by C-G formula based on SCr of 0.55 mg/dL (L)). Liver Function Tests: No results for input(s): AST, ALT, ALKPHOS, BILITOT, PROT, ALBUMIN in the last 168 hours. No results for input(s): LIPASE, AMYLASE in the last 168 hours. No results for input(s): AMMONIA in the last 168 hours. Coagulation Profile: Recent Labs  Lab 02/21/17 0300  INR 1.75   Cardiac Enzymes: No results for input(s): CKTOTAL, CKMB, CKMBINDEX, TROPONINI in the last 168 hours. BNP (last 3  results) No results for input(s): PROBNP in the last 8760 hours. HbA1C: No results for input(s): HGBA1C in the last 72 hours. CBG: Recent Labs  Lab 02/17/17 0332 02/17/17 0712 02/18/17 0826 02/19/17 0814 02/20/17 0837  GLUCAP 109* 105* 137* 90 103*   Lipid Profile: No results for input(s): CHOL, HDL, LDLCALC, TRIG, CHOLHDL, LDLDIRECT in the last 72 hours. Thyroid Function Tests: No results for input(s): TSH, T4TOTAL, FREET4, T3FREE, THYROIDAB in the last 72 hours. Anemia Panel: No results for input(s): VITAMINB12, FOLATE, FERRITIN, TIBC, IRON, RETICCTPCT in the last 72 hours. Sepsis Labs: No results for input(s): PROCALCITON, LATICACIDVEN in the last 168 hours.  Recent Results (from the past 240 hour(s))  Aerobic/Anaerobic Culture (surgical/deep wound)     Status: None (Preliminary result)   Collection Time: 02/21/17  9:49 AM  Result Value Ref Range Status   Specimen Description ABSCESS LEFT PLEURAL  Final   Special Requests Normal  Final   Gram Stain   Final    FEW WBC PRESENT, PREDOMINANTLY PMN NO ORGANISMS SEEN    Culture   Final    NO GROWTH 1 DAY Performed at [facility], [address], [place], [place] [address]    Report Status PENDING  Incomplete         Radiology Studies: Ct Head Wo Contrast  Result Date: 02/22/2017 CLINICAL DATA:  Altered mental status. Speech difficulty. On anticoagulation. EXAM: CT HEAD WITHOUT CONTRAST TECHNIQUE: Contiguous axial images were obtained from the base of the skull through the vertex without intravenous contrast. COMPARISON:  Head CT 03/15/2009 and MRI 06/01/2007 FINDINGS: Brain: There is no evidence of acute infarct, intracranial hemorrhage, mass, midline shift, or extra-axial fluid collection. Mild-to-moderate cerebral atrophy has progressed from 2011 and is advanced for age. There is mild cerebellar atrophy. Vascular: Calcified atherosclerosis at the skull base. No hyperdense vessel. Skull: No fracture or focal osseous  lesion. Sinuses/Orbits: Minimal scattered paranasal sinus mucosal thickening. Clear mastoid air cells. Unremarkable orbits. Other: None. IMPRESSION: 1. No evidence of acute intracranial abnormality. 2. Progressive cerebral atrophy. Electronically Signed   By: Sebastian Ache M.D.   On: 02/22/2017 15:50   Ct Image Guided Drainage By Percutaneous Catheter  Result Date: 02/21/2017 INDICATION: Admitted with pulmonary embolism, now with worsening extensive heterogeneous/consolidative opacities within the left lung with associated partially loculated left-sided pleural effusion. Please perform CT-guided left-sided chest tube placement for infection source control purposes. EXAM: CT IMAGE GUIDED DRAINAGE BY PERCUTANEOUS CATHETER COMPARISON:  None. MEDICATIONS: The patient is currently admitted to the hospital and receiving intravenous antibiotics. The antibiotics were administered within  an appropriate time frame prior to the initiation of the procedure. ANESTHESIA/SEDATION: Moderate (conscious) sedation was employed during this procedure. A total of Versed 0.5 mg and Fentanyl 25 mcg was administered intravenously. Moderate Sedation Time: 21 minutes. The patient's level of consciousness and vital signs were monitored continuously by radiology nursing throughout the procedure under my direct supervision. CONTRAST:  None COMPLICATIONS: None immediate. PROCEDURE: Informed written consent was obtained from the patient after a discussion of the risks, benefits and alternatives to treatment. The patient was placed supine on the CT gantry and a pre procedural CT was performed re-demonstrating the known moderate-sized partially loculated left-sided pleural effusion the procedure was planned. A timeout was performed prior to the initiation of the procedure. The skin along the left inferolateral chest wall was prepped and draped in the usual sterile fashion. The overlying soft tissues were anesthetized with 1% lidocaine with  epinephrine. Appropriate trajectory was planned with the use of a 22 gauge spinal needle. An 18 gauge trocar needle was advanced into the left pleural space and a short Amplatz super stiff wire was coiled within the left pleural space. Appropriate positioning was confirmed with a limited CT scan. The tract was serially dilated allowing placement of a 10 [place] all-purpose drainage catheter. Appropriate positioning was confirmed with a limited postprocedural CT scan. Approximately 240 cc slightly dark brown serous fluid was aspirated. The tube was connected to a pleural vac and sutured in place. A dressing was placed. The patient tolerated the procedure well without immediate post procedural complication. IMPRESSION: Successful CT guided placement of a 10 French all purpose drain catheter into the left pleural space with aspiration of 240 cc of slightly dark brown serous pleural fluid. Samples were sent to the laboratory as requested by the ordering clinical team. Electronically Signed   By: Simonne Come M.D.   On: 02/21/2017 10:26        Scheduled Meds: . amiodarone  200 mg Oral BID  . atorvastatin  40 mg Oral q1800  . digoxin  0.125 mg Oral Daily  . feeding supplement (ENSURE ENLIVE)  237 mL Oral QID  . folic acid  1 mg Oral Daily  . furosemide  40 mg Oral Daily  . losartan  12.5 mg Oral BID  . mometasone-formoterol  2 puff Inhalation BID  . multivitamin with minerals  1 tablet Oral Daily  . pantoprazole (PROTONIX) IV  40 mg Intravenous Q12H  . QUEtiapine  25 mg Oral QHS  . sodium chloride flush  10-40 mL Intracatheter Q12H  . sodium chloride flush  3 mL Intravenous Q12H  . spironolactone  25 mg Oral Daily  . thiamine  100 mg Oral Daily   Continuous Infusions: . ceFEPime (MAXIPIME) IV Stopped (02/23/17 0548)  . heparin 1,550 Units/hr (02/23/17 0548)  . vancomycin 750 mg (02/23/17 0548)     LOS: 12 days    Time spent: 35 minutes.     Alba Cory, MD [facility] Pager [phone]  If 7PM-7AM, please contact night-coverage [email or web address] Password [facility] 02/23/2017, 7:33 AM

## 2017-02-24 ENCOUNTER — Inpatient Hospital Stay (HOSPITAL_COMMUNITY): Payer: BLUE CROSS/BLUE SHIELD

## 2017-02-24 DIAGNOSIS — I2690 Septic pulmonary embolism without acute cor pulmonale: Secondary | ICD-10-CM

## 2017-02-24 LAB — BASIC METABOLIC PANEL
Anion gap: 11 (ref 5–15)
BUN: 20 mg/dL (ref 6–20)
CALCIUM: 7.9 mg/dL — AB (ref 8.9–10.3)
CO2: 24 mmol/L (ref 22–32)
CREATININE: 0.68 mg/dL (ref 0.61–1.24)
Chloride: 101 mmol/L (ref 101–111)
GFR calc Af Amer: >60 mL/min (ref >60)
GFR calc non Af Amer: >60 mL/min (ref >60)
Glucose, Bld: 125 mg/dL — ABNORMAL HIGH (ref 65–99)
Potassium: 3.9 mmol/L (ref 3.5–5.1)
SODIUM: 136 mmol/L (ref 135–145)

## 2017-02-24 LAB — CBC
HEMATOCRIT: 36.8 % — AB (ref 39.0–52.0)
Hemoglobin: 12.5 g/dL — ABNORMAL LOW (ref 13.0–17.0)
MCH: 31.5 pg (ref 26.0–34.0)
MCHC: 34 g/dL (ref 30.0–36.0)
MCV: 92.7 fL (ref 78.0–100.0)
Platelets: 314 10*3/uL (ref 150–400)
RBC: 3.97 MIL/uL — ABNORMAL LOW (ref 4.22–5.81)
RDW: 15.9 % — AB (ref 11.5–15.5)
WBC: 20.9 10*3/uL — ABNORMAL HIGH (ref 4.0–10.5)

## 2017-02-24 LAB — TSH: TSH: 3.491 u[IU]/mL (ref 0.350–4.500)

## 2017-02-24 LAB — VITAMIN B12: VITAMIN B 12: 1458 pg/mL — AB (ref 180–914)

## 2017-02-24 LAB — APTT: APTT: 97 s — AB (ref 24–36)

## 2017-02-24 LAB — HEPARIN LEVEL (UNFRACTIONATED): HEPARIN UNFRACTIONATED: 0.73 [IU]/mL — AB (ref 0.30–0.70)

## 2017-02-24 MED ORDER — APIXABAN 5 MG PO TABS
10.0000 mg | ORAL_TABLET | Freq: Two times a day (BID) | ORAL | Status: DC
  Administered 2017-02-24 – 2017-02-26 (×5): 10 mg via ORAL
  Filled 2017-02-24 (×5): qty 2

## 2017-02-24 MED ORDER — APIXABAN 5 MG PO TABS: 5.0000 mg | ORAL_TABLET | Freq: Two times a day (BID) | ORAL | Status: DC

## 2017-02-24 MED ORDER — CARVEDILOL 3.125 MG PO TABS
3.1250 mg | ORAL_TABLET | Freq: Two times a day (BID) | ORAL | Status: DC
  Administered 2017-02-24 – 2017-02-26 (×5): 3.125 mg via ORAL
  Filled 2017-02-24 (×5): qty 1

## 2017-02-24 NOTE — Progress Notes (Signed)
Referring Physician(s): Dr. Sunnie Nielsen  Supervising Physician: Malachy Moan  Patient Status:  [facility] - In-pt  Chief Complaint: Loculated pleural effusion  Subjective: Resting in bed. Soreness related to drain.  Thick clots/blood in chest tube.  No output from tube overnight.   Allergies: Patient has no known allergies.  Medications: Prior to Admission medications   Medication Sig Start Date End Date Taking? Authorizing Provider  albuterol (PROVENTIL HFA;VENTOLIN HFA) 108 (90 BASE) MCG/ACT inhaler Inhale 2 puffs into the lungs every 6 (six) hours as needed for wheezing.   Yes [provider]  aspirin 81 MG tablet Take 81 mg by mouth daily.   Yes [provider]  benzonatate (TESSALON) 200 MG capsule Take 1 capsule (200 mg total) by mouth 3 (three) times daily as needed for cough. 01/22/17  Yes Strader, [place] M, PA-C  Fluticasone-Salmeterol (ADVAIR) 250-50 MCG/DOSE AEPB Inhale 1 puff into the lungs every 12 (twelve) hours.   Yes [provider]  furosemide (LASIX) 20 MG tablet Take 1 tablet (20 mg total) by mouth daily. 01/01/17 04/01/17 Yes Dyann Kief, PA-C  losartan (COZAAR) 50 MG tablet Take 50 mg by mouth daily.   Yes [provider]  metoprolol succinate (TOPROL XL) 25 MG 24 hr tablet Take 0.5 tablets (12.5 mg total) by mouth daily. 01/22/17  Yes Strader, [place] M, PA-C  polyethylene glycol powder (GLYCOLAX/MIRALAX) powder Take 17 g daily by mouth. 08/31/16  Yes [provider]  potassium chloride SA (K-DUR,KLOR-CON) 20 MEQ tablet Take 1 tablet (20 mEq total) daily by mouth. 12/07/16  Yes Berton Bon, NP     Vital Signs: BP 102/75 (BP Location: Left Arm)   Pulse 88   Temp 98.7 F (37.1 C) (Oral)   Resp 19   Ht 5\' 10"  (1.778 m)   Wt 171 lb 15.3 oz (78 kg)   SpO2 100%   BMI 24.67 kg/m   Physical Exam  NAD, alert Chest:  Chest tube in place.  Insertion site intact, bloody.  Tube appears to be clotted with  blood.   Imaging: Ct Head Wo Contrast  Result Date: 02/22/2017 CLINICAL DATA:  Altered mental status. Speech difficulty. On anticoagulation. EXAM: CT HEAD WITHOUT CONTRAST TECHNIQUE: Contiguous axial images were obtained from the base of the skull through the vertex without intravenous contrast. COMPARISON:  Head CT 03/15/2009 and MRI 06/01/2007 FINDINGS: Brain: There is no evidence of acute infarct, intracranial hemorrhage, mass, midline shift, or extra-axial fluid collection. Mild-to-moderate cerebral atrophy has progressed from 2011 and is advanced for age. There is mild cerebellar atrophy. Vascular: Calcified atherosclerosis at the skull base. No hyperdense vessel. Skull: No fracture or focal osseous lesion. Sinuses/Orbits: Minimal scattered paranasal sinus mucosal thickening. Clear mastoid air cells. Unremarkable orbits. Other: None. IMPRESSION: 1. No evidence of acute intracranial abnormality. 2. Progressive cerebral atrophy. Electronically Signed   By: Sebastian Ache M.D.   On: 02/22/2017 15:50   Dg Chest Portable 1 View  Result Date: 02/24/2017 CLINICAL DATA:  Loculated pleural effusion on the left EXAM: PORTABLE CHEST 1 VIEW COMPARISON:  02/19/2017 FINDINGS: Right-sided PICC line is noted. Cardiac shadow is enlarged. A drainage catheter is noted on the left in the previously seen loculated pleural effusion. It has demonstrated significant decrease in size although a small component remains. Left basilar infiltrative changes are noted similar to that seen on prior CT. The right lung remains clear. No bony abnormality is seen. IMPRESSION: Significant reduction in left loculated pleural effusion following drainage. Persistent left  basilar infiltrate is noted. Electronically Signed   By: Alcide Clever M.D.   On: 02/24/2017 10:02   Ct Image Guided Drainage By Percutaneous Catheter  Result Date: 02/21/2017 INDICATION: Admitted with pulmonary embolism, now with worsening extensive  heterogeneous/consolidative opacities within the left lung with associated partially loculated left-sided pleural effusion. Please perform CT-guided left-sided chest tube placement for infection source control purposes. EXAM: CT IMAGE GUIDED DRAINAGE BY PERCUTANEOUS CATHETER COMPARISON:  None. MEDICATIONS: The patient is currently admitted to the hospital and receiving intravenous antibiotics. The antibiotics were administered within an appropriate time frame prior to the initiation of the procedure. ANESTHESIA/SEDATION: Moderate (conscious) sedation was employed during this procedure. A total of Versed 0.5 mg and Fentanyl 25 mcg was administered intravenously. Moderate Sedation Time: 21 minutes. The patient's level of consciousness and vital signs were monitored continuously by radiology nursing throughout the procedure under my direct supervision. CONTRAST:  None COMPLICATIONS: None immediate. PROCEDURE: Informed written consent was obtained from the patient after a discussion of the risks, benefits and alternatives to treatment. The patient was placed supine on the CT gantry and a pre procedural CT was performed re-demonstrating the known moderate-sized partially loculated left-sided pleural effusion the procedure was planned. A timeout was performed prior to the initiation of the procedure. The skin along the left inferolateral chest wall was prepped and draped in the usual sterile fashion. The overlying soft tissues were anesthetized with 1% lidocaine with epinephrine. Appropriate trajectory was planned with the use of a 22 gauge spinal needle. An 18 gauge trocar needle was advanced into the left pleural space and a short Amplatz super stiff wire was coiled within the left pleural space. Appropriate positioning was confirmed with a limited CT scan. The tract was serially dilated allowing placement of a 10 [place] all-purpose drainage catheter. Appropriate positioning was confirmed with a limited postprocedural  CT scan. Approximately 240 cc slightly dark brown serous fluid was aspirated. The tube was connected to a pleural vac and sutured in place. A dressing was placed. The patient tolerated the procedure well without immediate post procedural complication. IMPRESSION: Successful CT guided placement of a 10 French all purpose drain catheter into the left pleural space with aspiration of 240 cc of slightly dark brown serous pleural fluid. Samples were sent to the laboratory as requested by the ordering clinical team. Electronically Signed   By: Simonne Come M.D.   On: 02/21/2017 10:26    Labs:  CBC: Recent Labs    02/21/17 0300 02/22/17 1044 02/23/17 0428 02/24/17 0748  WBC 29.4* 20.7* 22.0* 20.9*  HGB 12.9* 11.9* 11.8* 12.5*  HCT 37.6* 35.5* 34.8* 36.8*  PLT 320 291 322 314    COAGS: Recent Labs    12/04/16 0918  02/10/17 1852 02/16/17 0731  02/21/17 0300  02/22/17 0021 02/22/17 1044 02/23/17 0428 02/24/17 0748  INR 1.13  --  1.43 1.81  --  1.75  --   --   --   --   --   APTT  --    < > 35 111*   < > 80*   < > 58* 65* 94* 97*   < > = values in this interval not displayed.    BMP: Recent Labs    02/21/17 0300 02/22/17 0500 02/23/17 0428 02/24/17 0748  NA 137 137 137 136  K 4.2 3.6 3.9 3.9  CL 99* 99* 101 101  CO2 27 27 26 24   GLUCOSE 114* 103* 124* 125*  BUN 18 17 19  20  CALCIUM 8.0* 7.8* 7.8* 7.9*  CREATININE 0.55* 0.55* 0.55* 0.68  GFRNONAA >60 >60 >60 >60  GFRAA >60 >60 >60 >60    LIVER FUNCTION TESTS: Recent Labs    02/10/17 1704 02/12/17 0327  BILITOT 3.3* 2.8*  AST 59* 40  ALT 45 35  ALKPHOS 70 63  PROT 6.8 6.2*  ALBUMIN 3.8 3.3*  3.3*    Assessment and Plan: Loculated pleural effusion s/p chest tube placement Chest tube with decreased output.  Per Dr. Vassie Loll, chest tube can be removed.  PA to bedside for removal of chest tube.  Removed without complication.  Patient tolerated well.  Dressing replaced. Will get CXR.  IR available if needed.    Electronically Signed: Hoyt Koch, PA 02/24/2017, 4:51 PM   I spent a total of 15 Minutes at the the patient's bedside AND on the patient's hospital floor or unit, greater than 50% of which was counseling/coordinating care for loculated pleural effusion.

## 2017-02-24 NOTE — Progress Notes (Addendum)
PROGRESS NOTE    Stephen Fleming  ZOX:[id number] DOB: May 02, 1955 DOA: 02/10/2017 PCP: Louie Boston., MD    Brief Narrative: Stephen Fleming is a 62 y.o. male with a history of chronic HFrEF, alcohol abuse (stopped Nov 2018), CAD [facility] Nov 2018 showed diffuse stenoses managed medically), HTN, OSA intolerant of CPAP, and gout who presented to [facility] 1/21 with bilateral leg pain, hemoptysis, severe dyspnea on exertion, found to have bilateral LE DVT's and acute submassive PE on CTA with RV/LV of 1, right heart strain noted on TTE. He was placed on heparin, somewhat hypotensive and was transferred to [facility] for consideration of thrombolytics. No thrombolytics were indicated. Heart failure team was consulted for acute systolic LV and right heart failure. He experienced PAF with RVR since returning to [facility] on amiodarone.    Assessment & Plan:   Active Problems:   Acute on chronic systolic CHF (congestive heart failure) (HCC)   Dyspnea   Chronic combined systolic and diastolic CHF (congestive heart failure) (HCC)   DVT of lower extremity, bilateral (HCC)   Hemoptysis   Acute pulmonary embolism (HCC)   Occult blood in stools   Long term current use of anticoagulant therapy   Malnutrition of moderate degree   Pulmonary infiltrate   Loculated pleural effusion   1-Acute hypoxic respiratory failure: Due to PE, acute CHF.  Treated with IV heparin, lasix.  Stable. On RA<  CT chest with left lobe infection, vs infarct, loculated effusion.  Underwent CT guide thoracentesis 2-0, follow  Pleural fluid culture results no growth.   2-Acute submassive PE with right heart strain and bilateral acute DVTs: Suspect LLL atelectasis due to pulmonary infarct.  Was treated with IV heparin. Transition to Eliquis on 02-17-2017.  Now back on heparin.  Evaluated by CCM and IR. No EKOS. If hemoptysis get worse , could consider IVC filter.  Pulmonary following.  Continue with heparin. Change to eliquis if ok by GI Discussed  case with Dr hung, he reviewed chart. He agree to resume xarelto, patient high risk for procedure with submassive PE and EF 15 %. Monitor closely on xarelto.   Acute on chronic systolic CHF: EF worsened 20-25% -> 10-15%. Possibly related to chronic EtOH in addition to medically managed CAD. HF team following.  On oral lasix. Started on cozaar, spironolactone.   PNA; vs lung infarct. Leukocytosis; chest x ray with worsening infiltrates PNA vs infarct. Started IV antibiotics. CCM consulted again.  CT with consolidation and loculated pleural effusion. Underwent CT guide thoracentesis.  Vancomycin and cefepime day 6  Await pulmonologist evaluation.  Culture negative to date, await results.  Discussed with Dr Vassie Loll, he think patient has hemothorax. Ok to discontinue chest tube. Need to ask GI if we can use xarelto for anticoagulation.   PAF with RVR:  On digoxin, amiodarone and eliquis.   CAD w/LBBB: LHC Nov 2018 > medical management. - Continue statin, anticoagulation   ? Possible GI bleeding: Rusty stool +FOBT 1/26 due to occult GIB vs. swallowed hemoptysis. +FOBT. GI consulted, though symptoms resolved.  Hb has remain stable.  Will consider NM tagged RBC scan if gross GI bleed.  GI Consulted, Dr Damita Lack , evaluated patient. Occult blood positive might be related to hemoptysis.   Discussed with Dr Vassie Loll, he recommend GI recommendation regarding resumption of xarelto.   Right atrial thrombus:  - Transitioned to eliquis as above.   EtOH abuse: Not recently. CIWA discontinued without evidence of withdrawal. - Continue supplements   Moderate protein calorie  malnutrition:  - RD consulted, supplements as ordered.   Difficulty speech; per family patient speech is different since he has been in the hospital. CT head negative. Patient neuro exam is non focal.    DVT prophylaxis: Eliquis  Code Status: full code.  Family Communication: care discussed with patient  Disposition Plan: Needs  SNF, [facility].    Consultants:   Cardiology   PCCM primary through 1/24      Procedures:  Echo1/22/2019 Left ventricle: The cavity size was moderately dilated. Wall thickness was normal. The estimated ejection fraction was in the range of 10% to 15%. Diffuse hypokinesis. The study is not technically sufficient to allow evaluation of LV diastolic function. - Regional wall motion abnormality: Akinesis of the basal-mid anteroseptal myocardium. - Aortic valve: There was mild regurgitation.  - Mitral valve: Mildly calcified annulus. There was mild regurgitation. - Left atrium: The atrium was severely dilated. - Right ventricle: The ventricular septum is flattend in systole and diastole consistent with RV pressure and volume overload. Systolic function was moderately reduced. TAPSE: 12.6 mm . - Right atrium: There is a semimobile linear structure in the right atrium that likely represents a thrombus. The atrium was moderately dilated. - Atrial septum: No defect or patent foramen ovale was identified. - Tricuspid valve: There was moderate regurgitation. - Pulmonary arteries: Systolic pressure was moderately increased. PA peak pressure: 46 mm Hg (S). - Inferior vena cava: The vessel was dilated. The respirophasic diameter changes were blunted (<50%), consistent with elevated central venous pressure.  ECHO 11/2016 EF 20-25% RV mild/mod dilated. RV normal.   LHC/RHC 12/04/2016  Prox RCA to Mid RCA lesion is 30% stenosed.  RPDA lesion is 50% stenosed.  Mid LAD lesion is 25% stenosed.  Prox LAD lesion is 20% stenosed.  Ost 1st Diag lesion is 60% stenosed.  Ost 1st Mrg lesion is 95% stenosed.  Ost 2nd Mrg lesion is 70% stenosed.  Hemodynamic findings consistent with moderate pulmonary hypertension. 1. CAD with 60% first diagonal, 95% ostial small OM1, 70% OM2, and 50% mid PDA 2. Moderate pulmonary HTN with mean PAP 37 mm  Hg 3. Moderately elevated LV filling pressures with PCWP of 29 mm Hg     Antimicrobials:  none   Subjective: He report hemoptysis has improved. Breathing ok. He couldn't sleep last night.     Objective: Vitals:   02/23/17 2311 02/24/17 0000 02/24/17 0428 02/24/17 0521  BP: 106/78   94/69  Pulse: (!) 103 (!) 101    Resp: (!) 22 (!) 29    Temp: 98.2 F (36.8 C)   98.7 F (37.1 C)  TempSrc: Oral   Oral  SpO2: 96% 96%    Weight:   78 kg (171 lb 15.3 oz)   Height:        Intake/Output Summary (Last 24 hours) at 02/24/2017 0750 Last data filed at 02/24/2017 0640 Gross per 24 hour  Intake 1142.11 ml  Output 1050 ml  Net 92.11 ml   Filed Weights   02/22/17 0423 02/23/17 0500 02/24/17 0428  Weight: 76 kg (167 lb 8.8 oz) 78 kg (171 lb 15.3 oz) 78 kg (171 lb 15.3 oz)    Examination:  General exam: Chronic ill.  Respiratory system: bilateral air movement. Chest tube in place.  Cardiovascular system: S 1, S 2 RRR Gastrointestinal system; BS present, soft, nt Central nervous system; alert.  Extremities:  Trace edema.  Skin: no rash     Data Reviewed: I have personally  reviewed following labs and imaging studies  CBC: Recent Labs  Lab 02/19/17 0334 02/20/17 0500 02/21/17 0300 02/22/17 1044 02/23/17 0428  WBC 23.7* 23.4* 29.4* 20.7* 22.0*  HGB 12.0* 12.5* 12.9* 11.9* 11.8*  HCT 35.1* 36.5* 37.6* 35.5* 34.8*  MCV 91.9 91.9 92.2 92.7 92.3  PLT 244 241 320 291 322   Basic Metabolic Panel: Recent Labs  Lab 02/19/17 0334 02/20/17 0500 02/21/17 0300 02/22/17 0500 02/23/17 0428  NA 135 132* 137 137 137  K 3.2* 3.3* 4.2 3.6 3.9  CL 94* 95* 99* 99* 101  CO2 29 29 27 27 26   GLUCOSE 121* 262* 114* 103* 124*  BUN 18 17 18 17 19   CREATININE 0.54* 0.53* 0.55* 0.55* 0.55*  CALCIUM 7.9* 7.6* 8.0* 7.8* 7.8*   GFR: Estimated Creatinine Clearance: 98.9 mL/min (A) (by C-G formula based on SCr of 0.55 mg/dL (L)). Liver Function Tests: No results for input(s): AST,  ALT, ALKPHOS, BILITOT, PROT, ALBUMIN in the last 168 hours. No results for input(s): LIPASE, AMYLASE in the last 168 hours. No results for input(s): AMMONIA in the last 168 hours. Coagulation Profile: Recent Labs  Lab 02/21/17 0300  INR 1.75   Cardiac Enzymes: No results for input(s): CKTOTAL, CKMB, CKMBINDEX, TROPONINI in the last 168 hours. BNP (last 3 results) No results for input(s): PROBNP in the last 8760 hours. HbA1C: No results for input(s): HGBA1C in the last 72 hours. CBG: Recent Labs  Lab 02/18/17 0826 02/19/17 0814 02/20/17 0837  GLUCAP 137* 90 103*   Lipid Profile: No results for input(s): CHOL, HDL, LDLCALC, TRIG, CHOLHDL, LDLDIRECT in the last 72 hours. Thyroid Function Tests: No results for input(s): TSH, T4TOTAL, FREET4, T3FREE, THYROIDAB in the last 72 hours. Anemia Panel: No results for input(s): VITAMINB12, FOLATE, FERRITIN, TIBC, IRON, RETICCTPCT in the last 72 hours. Sepsis Labs: No results for input(s): PROCALCITON, LATICACIDVEN in the last 168 hours.  Recent Results (from the past 240 hour(s))  Aerobic/Anaerobic Culture (surgical/deep wound)     Status: None (Preliminary result)   Collection Time: 02/21/17  9:49 AM  Result Value Ref Range Status   Specimen Description ABSCESS LEFT PLEURAL  Final   Special Requests Normal  Final   Gram Stain   Final    FEW WBC PRESENT, PREDOMINANTLY PMN NO ORGANISMS SEEN    Culture   Final    NO GROWTH 2 DAYS NO ANAEROBES ISOLATED; CULTURE IN PROGRESS FOR 5 DAYS Performed at [facility], [address], [place], [place] [address]    Report Status PENDING  Incomplete         Radiology Studies: Ct Head Wo Contrast  Result Date: 02/22/2017 CLINICAL DATA:  Altered mental status. Speech difficulty. On anticoagulation. EXAM: CT HEAD WITHOUT CONTRAST TECHNIQUE: Contiguous axial images were obtained from the base of the skull through the vertex without intravenous contrast. COMPARISON:  Head CT 03/15/2009  and MRI 06/01/2007 FINDINGS: Brain: There is no evidence of acute infarct, intracranial hemorrhage, mass, midline shift, or extra-axial fluid collection. Mild-to-moderate cerebral atrophy has progressed from 2011 and is advanced for age. There is mild cerebellar atrophy. Vascular: Calcified atherosclerosis at the skull base. No hyperdense vessel. Skull: No fracture or focal osseous lesion. Sinuses/Orbits: Minimal scattered paranasal sinus mucosal thickening. Clear mastoid air cells. Unremarkable orbits. Other: None. IMPRESSION: 1. No evidence of acute intracranial abnormality. 2. Progressive cerebral atrophy. Electronically Signed   By: Sebastian Ache M.D.   On: 02/22/2017 15:50        Scheduled  Meds: . amiodarone  200 mg Oral BID  . atorvastatin  40 mg Oral q1800  . digoxin  0.125 mg Oral Daily  . feeding supplement (ENSURE ENLIVE)  237 mL Oral QID  . folic acid  1 mg Oral Daily  . furosemide  40 mg Oral Daily  . losartan  12.5 mg Oral BID  . mometasone-formoterol  2 puff Inhalation BID  . multivitamin with minerals  1 tablet Oral Daily  . pantoprazole (PROTONIX) IV  40 mg Intravenous Q12H  . QUEtiapine  25 mg Oral QHS  . sodium chloride flush  10-40 mL Intracatheter Q12H  . sodium chloride flush  3 mL Intravenous Q12H  . spironolactone  25 mg Oral Daily  . thiamine  100 mg Oral Daily   Continuous Infusions: . ceFEPime (MAXIPIME) IV 2 g (02/24/17 0649)  . heparin 1,550 Units/hr (02/24/17 0543)  . vancomycin Stopped (02/24/17 0645)     LOS: 13 days    Time spent: 35 minutes.     Alba Cory, MD [facility] Pager [phone]  If 7PM-7AM, please contact night-coverage [email or web address] Password [email or web address] 02/24/2017, 7:50 AM

## 2017-02-24 NOTE — Progress Notes (Signed)
[facility] PCCM:  Summary: 62 y/o male with systolic heart failure admitted with a PE on 1/21 now with pleural effusion.   S: Chest tube placed by IR 2/1.  Feels ok.  Still with hemoptysis - dark.   O:  Vitals:   02/24/17 0521 02/24/17 0750  BP: 94/69 103/78  Pulse:  92  Resp:  (!) 25  Temp: 98.7 F (37.1 C)   SpO2:  92%   RA  General:  Tired, chronically ill appearing male, appears older than stated age HEENT: MM pink/moist Neuro: sleepy but easily arousable, appropriate, MAE CV: s1s2 rrr, no m/r/g PULM: even/non-labored on RA, diminished bases L>R, few scattered crackles, L chest tube with dark drainage, no air leak  YQ:MGNO, non-tender, bsx4 active  Extremities: warm/dry, 1+ BLE edema  Skin: no rashes or lesions   CT chest 1/31>>> 1. Moderate to large loculated left pleural effusion. 2. Dense airspace consolidation involves the entire left lower lobe and posterior left upper lobe which may be the sequelae of pulmonary infarct and/or infection. 3. Mild subpleural consolidation in the posterior right middle lobe. 4. Aortic Atherosclerosis (ICD10-I70.0). Three vessel coronary artery calcifications noted.  Impression/plan:  PE/ DVT with pulmonary infarct  RA thrombus on echo  hemoptysis Plan: continue Heparin gtt per pharmacy  Monitor hemoptysis  Titrate oxygen for sats > 94% Will need follow up with pulmonary as outpatient  Infiltrate:  - suspect infarct, possible pneumonia - Leukocytosis - Afebrile Plan:  Continue broad spectrum abx  F/u CXR now  Trend fever/wbc curve  Consider narrow or d/c abx   COPD: Plan Continue duonebs, dulera    Pleural Effusion -   - Successful CT guided drainage of Left sided pleural effusion 02/21/2017 per IR for 240 cc slightly dark serous pleural  Fluid>> aspirated and sent to lab for analysis  to evaluate for hemothorax or infected parapneumonic effusion. - 10 Fr CT placed to water seal>>??drainage. Initially drained 240, 350 in  [place] but no other outpt documented  Plan: - Follow Micro and pathology, cytology of pleural fluid - no cytology back 2/4 - Monitor drainage - Follow CXR now  - If recollects/ continues to drain large amounts  will need to consider VATS    Dirk Dress, NP 02/24/2017  9:06 AM Pager: [phone] or [phone]

## 2017-02-24 NOTE — Progress Notes (Signed)
Patient ID: Stephen Fleming, male   DOB: May 31, 1955, 62 y.o.   MRN: [id number]     Advanced Heart Failure Rounding Note  HF Cardiology: Dr Shirlee Latch  Subjective:   CO-OX 66%. Remains on heparin.   Complaining of fatigue. Denies SOB. Complaining of pain with cough. Continues to cough up blood.   CT chest (1/30) with moderate-large loculated left effusion + dense LLL consolidation, ?pulmonary infarct => chest tube placed.     Objective:   Weight Range: 171 lb 15.3 oz (78 kg) Body mass index is 24.67 kg/m.   Vital Signs:   Temp:  [97.8 F (36.6 C)-98.7 F (37.1 C)] 98.7 F (37.1 C) (02/04 0521) Pulse Rate:  [92-103] 92 (02/04 0750) Resp:  [17-29] 25 (02/04 0750) BP: (94-106)/(69-79) 103/78 (02/04 0750) SpO2:  [92 %-97 %] 92 % (02/04 0750) Weight:  [171 lb 15.3 oz (78 kg)] 171 lb 15.3 oz (78 kg) (02/04 0428) Last BM Date: 02/22/17(small brown smear in bedpan)  Weight change: Filed Weights   02/22/17 0423 02/23/17 0500 02/24/17 0428  Weight: 167 lb 8.8 oz (76 kg) 171 lb 15.3 oz (78 kg) 171 lb 15.3 oz (78 kg)    Intake/Output:   Intake/Output Summary (Last 24 hours) at 02/24/2017 0916 Last data filed at 02/24/2017 0752 Gross per 24 hour  Intake 1042.11 ml  Output 1350 ml  Net -307.89 ml      Physical Exam  CVP 2-3  General:   No resp difficulty HEENT: normal Neck: supple. no JVD. Carotids 2+ bilat; no bruits. No lymphadenopathy or thryomegaly appreciated. Cor: PMI nondisplaced. Regular rate & rhythm. No rubs, gallops or murmurs. Lungs: Decreased throughout. L CT Abdomen: soft, nontender, nondistended. No hepatosplenomegaly. No bruits or masses. Good bowel sounds. Extremities: no cyanosis, clubbing, rash, R and LLE ted hose.  Neuro: alert & orientedx3, cranial nerves grossly intact. moves all 4 extremities w/o difficulty. Affect pleasant   Telemetry   NSR 90s, personally reviewed.   EKG    No new tracings.  Labs    CBC Recent Labs    02/23/17 0428 02/24/17 0748   WBC 22.0* 20.9*  HGB 11.8* 12.5*  HCT 34.8* 36.8*  MCV 92.3 92.7  PLT 322 314   Basic Metabolic Panel Recent Labs    40/98/11 0428 02/24/17 0748  NA 137 136  K 3.9 3.9  CL 101 101  CO2 26 24  GLUCOSE 124* 125*  BUN 19 20  CREATININE 0.55* 0.68  CALCIUM 7.8* 7.9*   Liver Function Tests No results for input(s): AST, ALT, ALKPHOS, BILITOT, PROT, ALBUMIN in the last 72 hours. No results for input(s): LIPASE, AMYLASE in the last 72 hours. Cardiac Enzymes No results for input(s): CKTOTAL, CKMB, CKMBINDEX, TROPONINI in the last 72 hours.  BNP: BNP (last 3 results) Recent Labs    12/04/16 0030  BNP 1,027.4*    ProBNP (last 3 results) No results for input(s): PROBNP in the last 8760 hours.   D-Dimer No results for input(s): DDIMER in the last 72 hours. Hemoglobin A1C No results for input(s): HGBA1C in the last 72 hours. Fasting Lipid Panel No results for input(s): CHOL, HDL, LDLCALC, TRIG, CHOLHDL, LDLDIRECT in the last 72 hours. Thyroid Function Tests No results for input(s): TSH, T4TOTAL, T3FREE, THYROIDAB in the last 72 hours.  Invalid input(s): FREET3  Other results:  Imaging   No results found.  Medications:     Scheduled Medications: . amiodarone  200 mg Oral BID  . atorvastatin  40 mg  Oral q1800  . digoxin  0.125 mg Oral Daily  . feeding supplement (ENSURE ENLIVE)  237 mL Oral QID  . folic acid  1 mg Oral Daily  . furosemide  40 mg Oral Daily  . losartan  12.5 mg Oral BID  . mometasone-formoterol  2 puff Inhalation BID  . multivitamin with minerals  1 tablet Oral Daily  . pantoprazole (PROTONIX) IV  40 mg Intravenous Q12H  . QUEtiapine  25 mg Oral QHS  . sodium chloride flush  10-40 mL Intracatheter Q12H  . sodium chloride flush  3 mL Intravenous Q12H  . spironolactone  25 mg Oral Daily  . thiamine  100 mg Oral Daily    Infusions: . ceFEPime (MAXIPIME) IV 2 g (02/24/17 0649)  . heparin 1,550 Units/hr (02/24/17 0752)  . vancomycin Stopped  (02/24/17 0645)    PRN Medications: acetaminophen **OR** acetaminophen, albuterol, diazepam, ondansetron **OR** ondansetron (ZOFRAN) IV, sodium chloride flush, traMADol  Patient Profile   Stephen Fleming is a 37 year old with history of  Combined systolic/diastoic heart failure, CAD (cath in 11/2016 showing60% first diagonal, 95% ostial small OM1, 70% OM2, and 50% mid PDA--> medical management recommended, moderate Stephen, HTN, OSA intolerant CPAP.   Admitted with bilateral leg pain and dyspnea. Acute DVT/PE  Assessment/Plan   1. Bilateral DVTs with PE and pulmonary infarction: Did not have thrombolysis.  Thrombus in transit seen in RA on echo. Still with scant hemoptysis.   CT chest concerning for pulmonary infarction on left as well as left loculated effusion.   - On heparin drip.   2. Acute on chronic systolic CHF: Nonischemic cardiomyopathy, out of proportion to degree of CAD.  May be related to prior heavy ETOH.  Echo (1/19) with moderate LV dilation, EF 10-15%, moderately decreased RV systolic function, D-shaped interventricular septum suggestive of RV pressure/volume overload, PASP 46 mmHg.  Low output HF by RHC in 11/18 (CI 1.65, mean PCWP 29).   - Co-ox stable. 66%. CVP 2-3   -Continue lasix 40 mg daily.  - Can add Coreg 3.125 mg bid.  - Continue digoxin 0.125 mg daily - Continue losartan 12.5 mg bid.  - Continue spironolactone 25 mg daily.  - Off ivabradine with paroxysmal atrial fibrillation.  - Noted to have wide LBBB => CRT may be beneficial down the road.  - Can get cardiac MRI this admission prior to discharge given cardiomyopathy of uncertain etiology.  3. CAD: Patient had extensive CAD on 11/18 cath but primarily nonobstructive.  Does not explain cardiomyopathy.  - No s/s of ischemia.    - Continue statin.  4. Suspected atrial fibrillation with RVR:  -Maintaining NSR.  - On amio 200 mg twice a day.  - on heparin drip.  5. Heme+ stool:  - Hgb stable.  6. ID:  -WBC 21  today.  - BCs 23, cannot rule out PNA/HCAP, covering with vancomycin/cefepime.  7. Left pleural effusion: Loculated, moderate-large.   CT placed 02/21/2017. CT in place with no output.   CXR pending. CCM appreciated. May need VATs.  8. Severe Deconditioning:  - To SNF on discharge.  Plan on stopping heparin drip and switching to apixaban once we know he will not need additional procedures.   Tonye Becket, NP  02/24/2017 9:16 AM   Advanced Heart Failure Team Pager [phone] (M-F; 7a - 4p)  Please contact [facility] for night-coverage after hours (4p -7a ) and weekends on [email or web address]  Patient seen with NP, agree with the above note.  He is stable clinically. He has a loculated left hemothorax, chest tube was placed.  Chest tube with no drainage at this point.  - Plan to remove chest tube today.   Hemodynamically stable.  Can add low dose Coreg 3.125 mg bid today.   He remains on antibiotics for HCAP, per CCM.   When no further procedures, can transition from heparin gtt back to apixaban.   [name] 02/24/2017 1:30 PM

## 2017-02-24 NOTE — Progress Notes (Signed)
PT Cancellation Note  Patient Details Name: Stephen Fleming MRN: [id number] DOB: October 27, 1955   Cancelled Treatment:    Reason Eval/Treat Not Completed: (P) Patient declined, no reason specified(Pt declined PT visit and reports he does not feel like getting up as he has just gotten comfortable.  Pt educated on the benefits of mobility and the risks of remaining in the bed.  He verbalized understanding and remains to refuse despite efforts from PTA.)   Palyn Scrima J Aundria Rud 02/24/2017, 3:51 PM Joycelyn Rua, PTA pager [phone]

## 2017-02-24 NOTE — Progress Notes (Signed)
Seen by CCM who claimed to call IR to remove chest  tube. IR PA made aware who claimed to come to d/c the chest tube.

## 2017-02-24 NOTE — Progress Notes (Addendum)
ANTICOAGULATION CONSULT NOTE - Follow Up Consult  Pharmacy Consult for heparin >> apixaban >>heparin infusion Indication: pulmonary embolus  No Known Allergies  Patient Measurements: Height: 5\' 10"  (177.8 cm) Weight: 171 lb 15.3 oz (78 kg) IBW/kg (Calculated) : 73 HEPARIN DW (KG): 77.2  Vital Signs: Temp: 98.7 F (37.1 C) (02/04 0521) Temp Source: Oral (02/04 0521) BP: 103/78 (02/04 0750) Pulse Rate: 92 (02/04 0750)  Labs: Recent Labs    02/22/17 0500  02/22/17 1044 02/23/17 0428 02/23/17 0431 02/24/17 0748  HGB  --    < > 11.9* 11.8*  --  12.5*  HCT  --   --  35.5* 34.8*  --  36.8*  PLT  --   --  291 322  --  314  APTT  --   --  65* 94*  --  97*  HEPARINUNFRC  --   --  0.67  --  0.72* 0.73*  CREATININE 0.55*  --   --  0.55*  --  0.68   < > = values in this interval not displayed.    Estimated Creatinine Clearance: 98.9 mL/min (by C-G formula based on SCr of 0.68 mg/dL).  Assessment: Stephen Fleming presents with PE/DVT initially started on IV heparin. Briefly transitioned to apixaban but back on IV heparin due to concern for GIB. Once stabilized, transitioned back to apixaban. CCM consulted for worsening LUL infiltrate. CT chest showed moderate to large loculated pleural effusion. Apixaban stopped (last dose 1/31 at 0916) and he was switched back to heparin pending thoracentesis. S/P thoracentesis 2/1 and heparin resumed.  AM aPTT at goal (97), heparin level still being slightly affected by recent apixaban (0.73). CBC stable. No signs/symptoms of bleeding.   Goal of Therapy:  Heparin level: 0.3-0.7 APTT level: 66-102 sec Monitor platelets by anticoagulation protocol: Yes   Plan:  -Continue heparin infusion at 1550 units/hr -Daily heparin level and APTT - expect will be fully correlating soon. -Monitor chest tube output, CBC, S/Sx bleeding -F/U transition back to apixaban  Girard Cooter, PharmD Clinical Pharmacist  Pager: [phone] Clinical Phone for 02/24/2017 until  3:30pm: [phone] If after 3:30pm, please call main pharmacy at [phone]  02/24/2017 9:20 AM   ADDENDUM Orders entered by MD to change to apixaban from heparin infusion. Has been on and off apixaban and heparin infusion for PE. Will plan to restart loading dose for apixaban tonight at 1700. Discussed with nursing plan for transition of anticoagulation. No signs/symptoms of bleeding. Renal function is stable.   Plan: 1) Discontinue heparin infusion at 1700 2) Give first dose of 10 mg at time of heparin infusion discontinuation 3) Ordered apixaban 10 mg BID x7 days then 5 mg BID thereafter 4) Monitor CBC, renal function, signs/symptoms of bleeding.    Girard Cooter, PharmD Clinical Pharmacist  Pager: [phone] Phone: [phone]

## 2017-02-25 LAB — BASIC METABOLIC PANEL
Anion gap: 12 (ref 5–15)
BUN: 27 mg/dL — AB (ref 6–20)
CHLORIDE: 102 mmol/L (ref 101–111)
CO2: 24 mmol/L (ref 22–32)
CREATININE: 0.81 mg/dL (ref 0.61–1.24)
Calcium: 8 mg/dL — ABNORMAL LOW (ref 8.9–10.3)
GFR calc Af Amer: >60 mL/min (ref >60)
GFR calc non Af Amer: >60 mL/min (ref >60)
Glucose, Bld: 119 mg/dL — ABNORMAL HIGH (ref 65–99)
POTASSIUM: 3.8 mmol/L (ref 3.5–5.1)
Sodium: 138 mmol/L (ref 135–145)

## 2017-02-25 LAB — CBC
HEMATOCRIT: 36.7 % — AB (ref 39.0–52.0)
HEMOGLOBIN: 12.1 g/dL — AB (ref 13.0–17.0)
MCH: 31.1 pg (ref 26.0–34.0)
MCHC: 33 g/dL (ref 30.0–36.0)
MCV: 94.3 fL (ref 78.0–100.0)
Platelets: 353 10*3/uL (ref 150–400)
RBC: 3.89 MIL/uL — ABNORMAL LOW (ref 4.22–5.81)
RDW: 16.1 % — ABNORMAL HIGH (ref 11.5–15.5)
WBC: 21.7 10*3/uL — AB (ref 4.0–10.5)

## 2017-02-25 LAB — COOXEMETRY PANEL
Carboxyhemoglobin: 1.8 % — ABNORMAL HIGH (ref 0.5–1.5)
Methemoglobin: 0.9 % (ref 0.0–1.5)
O2 SAT: 69.7 %
TOTAL HEMOGLOBIN: 12.2 g/dL (ref 12.0–16.0)

## 2017-02-25 LAB — GLUCOSE, CAPILLARY: GLUCOSE-CAPILLARY: 109 mg/dL — AB (ref 65–99)

## 2017-02-25 LAB — VITAMIN D 25 HYDROXY (VIT D DEFICIENCY, FRACTURES): Vit D, 25-Hydroxy: 19.1 ng/mL — ABNORMAL LOW (ref 30.0–100.0)

## 2017-02-25 MED ORDER — AMIODARONE HCL 200 MG PO TABS
200.0000 mg | ORAL_TABLET | Freq: Every day | ORAL | Status: DC
Start: 2017-02-26 — End: 2017-02-27
  Administered 2017-02-26: 200 mg via ORAL
  Filled 2017-02-25: qty 1

## 2017-02-25 MED ORDER — VITAMIN D (ERGOCALCIFEROL) 1.25 MG (50000 UNIT) PO CAPS
50000.0000 [IU] | ORAL_CAPSULE | ORAL | Status: DC
  Administered 2017-02-25: 50000 [IU] via ORAL
  Filled 2017-02-25: qty 1

## 2017-02-25 NOTE — Clinical Social Work Note (Signed)
Clinical Social Work Assessment  Patient Details  Name: Stephen Fleming MRN: [id number] Date of Birth: [birth year]  Date of referral:  02/25/17               Reason for consult:  Discharge Planning, Facility Placement                Permission sought to share information with:  Family Supports Permission granted to share information::  Yes, Verbal Permission Granted  Name::     Jarell Mcewen  Agency::  snf  Relationship::  spouse  Contact Information:  [phone]  Housing/Transportation Living arrangements for the past 2 months:  [place] of Information:  Patient Patient Interpreter Needed:  None Criminal Activity/Legal Involvement Pertinent to Current Situation/Hospitalization:  No - Comment as needed Significant Relationships:  Spouse, Adult Children, Other Family Members Lives with:  Spouse Do you feel safe going back to the place where you live?  Yes Need for family participation in patient care:  Yes (Comment)  Care giving concerns:  No family at bedside.  Patient lives at home with spouse and has support from adult children  Social Worker assessment / plan: CSW met patient at bedside to discuss disposition plan. Patient had wanted to discharge to [facility] but CSW made patient aware that [facility] declined bed offer. [facility] stated that they are not in network with BC/BS and they would be unable to take patient. patient stated to CSW that he would be willing to pay out of pocket. Patient stated he would like exact amount needed for [facility].Patient gave CSW verbal permission to contact family/spouse to make sure family is okay with paying out of pocket.  CSW spoke with daughter since CSW was unable to get a hold of spouse. Daughter stated that she will reach out to her mother to see if she would be willing to pay out of picket or convince patient to pick another facility.     Employment status:  Disabled (Comment on  whether or not currently receiving Disability)(on disability through employer) Insurance information:  Other (Comment Required)(BC/BS) PT Recommendations:  [place] / Referral to community resources:  [place]  Patient/Family's Response to care:  Family very supportive of patient and his needs  Patient/Family's Understanding of and Emotional Response to Diagnosis, Current Treatment, and Prognosis: Patient agreeable to discharge to SNF but would prefer [facility] since they have a history with the facility.   Emotional Assessment Appearance:  Appears stated age Attitude/Demeanor/Rapport:  Other Affect (typically observed):  Accepting Orientation:  Oriented to Self, Oriented to Place, Oriented to  Time, Oriented to Situation Alcohol / Substance use:  Not Applicable Psych involvement (Current and /or in the community):     Discharge Needs  Concerns to be addressed:  No discharge needs identified Readmission within the last 30 days:  No Current discharge risk:  None Barriers to Discharge:      Wende Neighbors, LCSW 02/25/2017, 3:40 PM

## 2017-02-25 NOTE — Progress Notes (Signed)
Patient ID: Stephen Fleming, male   DOB: 1955-10-02, 62 y.o.   MRN: [id number]     Advanced Heart Failure Rounding Note  HF Cardiology: Dr Shirlee Latch  Subjective:   CO-OX 70%.   Yesterday CT removed. Heparin stopped and apixban started.   Feeling better today. Denies SOB.   CT chest (1/30) with moderate-large loculated left effusion + dense LLL consolidation, ?pulmonary infarct => chest tube placed.     Objective:   Weight Range: 168 lb 3.4 oz (76.3 kg) Body mass index is 24.14 kg/m.   Vital Signs:   Temp:  [97.6 F (36.4 C)-98.4 F (36.9 C)] 97.6 F (36.4 C) (02/05 0330) Pulse Rate:  [80-98] 80 (02/05 0743) Resp:  [18-24] 19 (02/05 0743) BP: (94-107)/(69-75) 95/72 (02/05 0743) SpO2:  [94 %-100 %] 97 % (02/05 0743) Weight:  [168 lb 3.4 oz (76.3 kg)] 168 lb 3.4 oz (76.3 kg) (02/05 0330) Last BM Date: 02/22/17  Weight change: Filed Weights   02/23/17 0500 02/24/17 0428 02/25/17 0330  Weight: 171 lb 15.3 oz (78 kg) 171 lb 15.3 oz (78 kg) 168 lb 3.4 oz (76.3 kg)    Intake/Output:   Intake/Output Summary (Last 24 hours) at 02/25/2017 0848 Last data filed at 02/25/2017 0507 Gross per 24 hour  Intake 1438 ml  Output 1000 ml  Net 438 ml      Physical Exam  CVP 4-5 General:  Well appearing. No resp difficulty HEENT: normal Neck: supple. no JVD. Carotids 2+ bilat; no bruits. No lymphadenopathy or thryomegaly appreciated. Cor: PMI nondisplaced. Regular rate & rhythm. No rubs, gallops or murmurs. Lungs: clear RLL crackles. On room air.  Abdomen: soft, nontender, nondistended. No hepatosplenomegaly. No bruits or masses. Good bowel sounds. Extremities: no cyanosis, clubbing, rash, edema Neuro: alert & orientedx3, cranial nerves grossly intact. moves all 4 extremities w/o difficulty. Affect pleasant    Telemetry  NSR 80-90s personally reviewed.   EKG    No new tracings.  Labs    CBC Recent Labs    02/23/17 0428 02/24/17 0748  WBC 22.0* 20.9*  HGB 11.8* 12.5*  HCT  34.8* 36.8*  MCV 92.3 92.7  PLT 322 314   Basic Metabolic Panel Recent Labs    11/91/47 0748 02/25/17 0502  NA 136 138  K 3.9 3.8  CL 101 102  CO2 24 24  GLUCOSE 125* 119*  BUN 20 27*  CREATININE 0.68 0.81  CALCIUM 7.9* 8.0*   Liver Function Tests No results for input(s): AST, ALT, ALKPHOS, BILITOT, PROT, ALBUMIN in the last 72 hours. No results for input(s): LIPASE, AMYLASE in the last 72 hours. Cardiac Enzymes No results for input(s): CKTOTAL, CKMB, CKMBINDEX, TROPONINI in the last 72 hours.  BNP: BNP (last 3 results) Recent Labs    12/04/16 0030  BNP 1,027.4*    ProBNP (last 3 results) No results for input(s): PROBNP in the last 8760 hours.   D-Dimer No results for input(s): DDIMER in the last 72 hours. Hemoglobin A1C No results for input(s): HGBA1C in the last 72 hours. Fasting Lipid Panel No results for input(s): CHOL, HDL, LDLCALC, TRIG, CHOLHDL, LDLDIRECT in the last 72 hours. Thyroid Function Tests Recent Labs    02/24/17 0748  TSH 3.491    Other results:  Imaging   Dg Chest Port 1 View  Result Date: 02/24/2017 CLINICAL DATA:  Left chest tube removal. EXAM: PORTABLE CHEST 1 VIEW COMPARISON:  Earlier same day. FINDINGS: Left chest tube is been removed. There is no pneumothorax. Persistent  left pleural density with infiltrate and/or volume loss in the left lung. Right chest remains clear. Right arm PICC tip at the SVC RA junction as seen previously. IMPRESSION: Left chest tube removed. No pneumothorax. Persistent left pleural density with left parenchymal infiltrate and/or volume loss as seen before. Electronically Signed   By: Paulina Fusi M.D.   On: 02/24/2017 17:13   Dg Chest Portable 1 View  Result Date: 02/24/2017 CLINICAL DATA:  Loculated pleural effusion on the left EXAM: PORTABLE CHEST 1 VIEW COMPARISON:  02/19/2017 FINDINGS: Right-sided PICC line is noted. Cardiac shadow is enlarged. A drainage catheter is noted on the left in the previously  seen loculated pleural effusion. It has demonstrated significant decrease in size although a small component remains. Left basilar infiltrative changes are noted similar to that seen on prior CT. The right lung remains clear. No bony abnormality is seen. IMPRESSION: Significant reduction in left loculated pleural effusion following drainage. Persistent left basilar infiltrate is noted. Electronically Signed   By: Alcide Clever M.D.   On: 02/24/2017 10:02    Medications:     Scheduled Medications: . amiodarone  200 mg Oral BID  . apixaban  10 mg Oral BID   Followed by  . [START ON 03/03/2017] apixaban  5 mg Oral BID  . atorvastatin  40 mg Oral q1800  . carvedilol  3.125 mg Oral BID WC  . digoxin  0.125 mg Oral Daily  . feeding supplement (ENSURE ENLIVE)  237 mL Oral QID  . folic acid  1 mg Oral Daily  . furosemide  40 mg Oral Daily  . losartan  12.5 mg Oral BID  . mometasone-formoterol  2 puff Inhalation BID  . multivitamin with minerals  1 tablet Oral Daily  . pantoprazole (PROTONIX) IV  40 mg Intravenous Q12H  . QUEtiapine  25 mg Oral QHS  . sodium chloride flush  10-40 mL Intracatheter Q12H  . sodium chloride flush  3 mL Intravenous Q12H  . spironolactone  25 mg Oral Daily  . thiamine  100 mg Oral Daily  . Vitamin D (Ergocalciferol)  50,000 Units Oral Q7 days    Infusions: . ceFEPime (MAXIPIME) IV 2 g (02/25/17 0507)  . vancomycin 750 mg (02/25/17 0507)    PRN Medications: acetaminophen **OR** acetaminophen, albuterol, diazepam, ondansetron **OR** ondansetron (ZOFRAN) IV, sodium chloride flush, traMADol  Patient Profile   Mr Kauer is a 62 year old with history of  Combined systolic/diastoic heart failure, CAD (cath in 11/2016 showing60% first diagonal, 95% ostial small OM1, 70% OM2, and 50% mid PDA--> medical management recommended, moderate MR, HTN, OSA intolerant CPAP.   Admitted with bilateral leg pain and dyspnea. Acute DVT/PE  Assessment/Plan   1. Bilateral DVTs  with PE and pulmonary infarction: Did not have thrombolysis.  Thrombus in transit seen in RA on echo. Still with scant hemoptysis.   CT chest concerning for pulmonary infarction on left as well as left loculated effusion.   On apixaban.  2. Acute on chronic systolic CHF: Nonischemic cardiomyopathy, out of proportion to degree of CAD.  May be related to prior heavy ETOH.  Echo (1/19) with moderate LV dilation, EF 10-15%, moderately decreased RV systolic function, D-shaped interventricular septum suggestive of RV pressure/volume overload, PASP 46 mmHg.  Low output HF by RHC in 11/18 (CI 1.65, mean PCWP 29).   - CO-OX 70%. Continue current regimen. No room for uptitration.   -Continue lasix 40 mg daily.  - Continue Coreg 3.125 mg bid.  -  Continue digoxin 0.125 mg daily - Continue losartan 12.5 mg bid.  - Continue spironolactone 25 mg daily.  - Off ivabradine with paroxysmal atrial fibrillation.  - Noted to have wide LBBB => CRT may be beneficial down the road.  - Can get cardiac MRI this admission prior to discharge given cardiomyopathy of uncertain etiology.  3. CAD: Patient had extensive CAD on 11/18 cath but primarily nonobstructive.  Does not explain cardiomyopathy.  - No s/s of ischemia.    - Continue statin.  4. Suspected atrial fibrillation with RVR:  -Maintaining NSR.  - On amio 200 mg twice a day.  - on apixaban 5 mg twice a day.  5. Heme+ stool:  - Hgb stable.  6. ID:  -- BCs 23, cannot rule out PNA/HCAP, covering with vancomycin/cefepime.  7. Left pleural effusion: Loculated, moderate-large.   CT placed 02/21/2017. CT removed 2/4 8. Severe Deconditioning:  - To SNF on discharge.  Encouraged to ambulate with PT. Looks better today. Should be able to move out of SDU.   Tonye Becket, NP  02/25/2017 8:48 AM   Advanced Heart Failure Team Pager [phone] (M-F; 7a - 4p)  Please contact [facility] for night-coverage after hours (4p -7a ) and weekends on [email or web address]  Patient seen with  NP, agree with the above note.  Left chest tube out, follow for re-accumulation of pleural fluid (would need VATS).    Labs and BP stable.  Continue current cardiac meds.   Now on apixaban for PE.   Can decrease amiodarone to 200 mg daily .  [name] 02/25/2017

## 2017-02-25 NOTE — Progress Notes (Addendum)
OT Treatment Note  Pt making good progress. VSS during session. Pt able to ambulate over 80 ft with +2 min A for managing lines and 1 rest break. Dyspnea 1/4. Encouraged pt to ambulate with staff after lunch. Overall mod A for ADL - encourage pt to complete more self care tasks himself. Will continue to follow and recommend SNF for rehab.    02/25/17 1100  OT Visit Information  Last OT Received On 02/25/17  Assistance Needed +1  PT/OT/SLP Co-Evaluation/Treatment Yes  Reason for Co-Treatment To address functional/ADL transfers  OT goals addressed during session ADL's and self-care;Strengthening/ROM  History of Present Illness Pt is a 62 y.o. male with PMH of combined systolic/diastoic HF, CAD, moderate MR, HTN, OSA (intolerant CPAP), admitted 02/10/17 with bilateral leg pain and dyspnea. Found to have acute DVT/PE. CT chest 1/30 showing mod-large loculated left effusion and desnse LLL consolidation. 2/1 CT guided drain/thoracentesis and chest tube.  Precautions  Precautions Fall  Pain Assessment  Pain Assessment Faces  Faces Pain Scale 4  Pain Location outer anus due to hemorrhoids  Pain Descriptors / Indicators Discomfort  Pain Intervention(s) Limited activity within patient's tolerance;Repositioned (recommed use of donut cusion for sitting)  Cognition  Arousal/Alertness Awake/alert  Behavior During Therapy Flat affect  Overall Cognitive Status Difficult to assess  Area of Impairment Safety/judgement;Memory  Current Attention Level Selective  Memory Decreased short-term memory  Following Commands Follows one step commands consistently  Safety/Judgement Decreased awareness of deficits  Problem Solving Requires verbal cues;Difficulty sequencing  Difficult to assess due to (at times communication is unintelligible.  )  ADL  Overall ADL's  Needs assistance/impaired  Grooming Supervision/safety;Set up;Sitting  Toilet Transfer Minimal assistance;RW;BSC  Toileting- Clothing Manipulation  and Hygiene Sit to/from stand;Maximal assistance  Toileting - Clothing Manipulation Details (indicate cue type and reason) Pt not wanting to attempt to clean himself after a BM  Functional mobility during ADLs Minimal assistance;Rolling walker;Cueing for safety  Bed Mobility  General bed mobility comments PT on BSC  Balance  Overall balance assessment Needs assistance  Sitting balance-Leahy Scale Fair  Standing balance-Leahy Scale Poor  Standing balance comment relies on UE support and RW, would not remove hands from RW to attempt pericare.    Transfers  Overall transfer level Needs assistance  Equipment used Rolling walker (2 wheeled)  Transfers Sit to/from Stand  Sit to Stand Min assist;+2 safety/equipment  Stand pivot transfers Min assist;+2 safety/equipment  General transfer comment vc for hand placement  OT - End of Session  Equipment Utilized During Treatment Gait belt;Rolling walker  Activity Tolerance Patient tolerated treatment well  Patient left in chair;with call bell/phone within reach  Nurse Communication Mobility status;Other (comment) (use of donut cushion)  OT Assessment/Plan  OT Plan Discharge plan remains appropriate  OT Visit Diagnosis Unsteadiness on feet (R26.81);Other abnormalities of gait and mobility (R26.89);Muscle weakness (generalized) (M62.81);Other symptoms and signs involving cognitive function;Adult, failure to thrive (R62.7)  OT Frequency (ACUTE ONLY) Min 2X/week  Follow Up Recommendations SNF  OT Equipment Other (comment) (TBA at SNF)  AM-PAC OT "6 Clicks" Daily Activity Outcome Measure  Help from another person eating meals? 4  Help from another person taking care of personal grooming? 3  Help from another person toileting, which includes using toliet, bedpan, or urinal? 2  Help from another person bathing (including washing, rinsing, drying)? 2  Help from another person to put on and taking off regular upper body clothing? 3  Help from another  person to put on and  taking off regular lower body clothing? 2  6 Click Score 16  ADL G Code Conversion CK  OT Goal Progression  Progress towards OT goals Progressing toward goals  Acute Rehab OT Goals  Patient Stated Goal get better  OT Goal Formulation With patient  Time For Goal Achievement 03/06/17  Potential to Achieve Goals Good  ADL Goals  Pt Will Perform Toileting - Clothing Manipulation and hygiene with supervision;sit to/from stand  Pt Will Perform Grooming with supervision;standing  Pt Will Transfer to Toilet with supervision;ambulating  Additional ADL Goal #1 Pt will recall 3 energy conservation strategies for ADL with 2 or less verbal cues  OT Time Calculation  OT Start Time (ACUTE ONLY) 0927  OT Stop Time (ACUTE ONLY) 1000  OT Time Calculation (min) 33 min  OT General Charges  $OT Visit 1 Visit  OT Treatments  $Self Care/Home Management  8-22 mins  [facility], OT/L  [phone] 02/25/2017

## 2017-02-25 NOTE — NC FL2 (Signed)
MEDICAID FL2 LEVEL OF CARE SCREENING TOOL     IDENTIFICATION  Patient Name: Stephen Fleming Birthdate: Oct 13, 1955 Sex: male Admission Date (Current Location): 02/10/2017  [facility] and [place] Number:  Producer, television/film/video and Address:  [facility] [address], [place], [place] [address]      Provider Number: [id number]  Attending Physician Name and Address:  Alba Cory, MD  Relative Name and Phone Number:       Current Level of Care:   Recommended Level of Care: Skilled Nursing Facility Prior Approval Number:    Date Approved/Denied:   PASRR Number: [id number]  Discharge Plan: SNF    Current Diagnoses: Patient Active Problem List   Diagnosis Date Noted  . Loculated pleural effusion   . Pulmonary infiltrate   . Malnutrition of moderate degree 02/18/2017  . Occult blood in stools   . Long term current use of anticoagulant therapy   . Chronic combined systolic and diastolic CHF (congestive heart failure) (HCC) 02/11/2017  . DVT of lower extremity, bilateral (HCC) 02/11/2017  . Hemoptysis 02/11/2017  . Acute pulmonary embolism (HCC) 02/11/2017  . Orthostatic hypotension 01/01/2017  . ETOH abuse 12/17/2016  . Mitral regurgitation 12/06/2016  . CAD (coronary artery disease) 12/06/2016  . Essential (primary) hypertension 12/04/2016  . Acute on chronic systolic CHF (congestive heart failure) (HCC) 12/03/2016  . Dyspnea 12/03/2016  . Systolic CHF with reduced left ventricular function, NYHA class 3 (HCC) 12/03/2016  . Asthma 12/03/2016    Orientation RESPIRATION BLADDER Height & Weight     Time, Situation, Place, Self  Normal Continent Weight: 168 lb 3.4 oz (76.3 kg) Height:  5\' 10"  (177.8 cm)  BEHAVIORAL SYMPTOMS/MOOD NEUROLOGICAL BOWEL NUTRITION STATUS      Continent (Please see d/c summary)  AMBULATORY STATUS COMMUNICATION OF NEEDS Skin   Limited Assist Verbally Surgical wounds(Closed incision left chest.)                        Personal Care Assistance Level of Assistance  Bathing, Feeding, Dressing Bathing Assistance: Limited assistance Feeding assistance: Independent Dressing Assistance: Limited assistance     Functional Limitations Info  Sight, Hearing, Speech Sight Info: Adequate Hearing Info: Adequate Speech Info: Adequate    SPECIAL CARE FACTORS FREQUENCY  PT (By licensed PT), OT (By licensed OT)     PT Frequency: 3x OT Frequency: 3x            Contractures Contractures Info: Not present    Additional Factors Info  Isolation Precautions Code Status Info: Full Allergies Info: No known allergies     Isolation Precautions Info: MRSA     Current Medications (02/25/2017):  This is the current hospital active medication list Current Facility-Administered Medications  Medication Dose Route Frequency Provider Last Rate Last Dose  . acetaminophen (TYLENOL) tablet 650 mg  650 mg Oral Q6H PRN Haydee Salter, MD   650 mg at 02/14/17 0145   Or  . acetaminophen (TYLENOL) suppository 650 mg  650 mg Rectal Q6H PRN Haydee Salter, MD      . albuterol (PROVENTIL) (2.5 MG/3ML) 0.083% nebulizer solution 2.5 mg  2.5 mg Nebulization Q2H PRN Haydee Salter, MD      . Melene Muller ON 02/26/2017] amiodarone (PACERONE) tablet 200 mg  200 mg Oral Daily Laurey Morale, MD      . apixaban (ELIQUIS) tablet 10 mg  10 mg Oral BID Regalado, Prentiss Bells, MD  10 mg at 02/25/17 5456   Followed by  . [START ON 03/03/2017] apixaban (ELIQUIS) tablet 5 mg  5 mg Oral BID Regalado, Belkys A, MD      . atorvastatin (LIPITOR) tablet 40 mg  40 mg Oral q1800 Laurey Morale, MD   40 mg at 02/24/17 1654  . carvedilol (COREG) tablet 3.125 mg  3.125 mg Oral BID WC Laurey Morale, MD   3.125 mg at 02/25/17 2563  . ceFEPIme (MAXIPIME) 2 g in dextrose 5 % 50 mL IVPB  2 g Intravenous Q8H Regalado, Belkys A, MD   Stopped at 02/25/17 1429  . diazepam (VALIUM) tablet 2 mg  2 mg Oral BID PRN John Giovanni, MD   2 mg at  02/20/17 2047  . digoxin (LANOXIN) tablet 0.125 mg  0.125 mg Oral Daily Clegg, Amy D, NP   0.125 mg at 02/25/17 0923  . feeding supplement (ENSURE ENLIVE) (ENSURE ENLIVE) liquid 237 mL  237 mL Oral QID Tyrone Nine, MD   237 mL at 02/24/17 1700  . folic acid (FOLVITE) tablet 1 mg  1 mg Oral Daily Hammonds, Curt Jews, MD   1 mg at 02/25/17 8937  . furosemide (LASIX) tablet 40 mg  40 mg Oral Daily Laurey Morale, MD   40 mg at 02/25/17 3428  . losartan (COZAAR) tablet 12.5 mg  12.5 mg Oral BID Laurey Morale, MD   12.5 mg at 02/25/17 7681  . mometasone-formoterol (DULERA) 200-5 MCG/ACT inhaler 2 puff  2 puff Inhalation BID Haydee Salter, MD   2 puff at 02/25/17 0745  . multivitamin with minerals tablet 1 tablet  1 tablet Oral Daily Hammonds, Curt Jews, MD   1 tablet at 02/25/17 [phone]  . ondansetron (ZOFRAN) tablet 4 mg  4 mg Oral Q6H PRN Haydee Salter, MD       Or  . ondansetron [facility]) injection 4 mg  4 mg Intravenous Q6H PRN Haydee Salter, MD      . pantoprazole (PROTONIX) injection 40 mg  40 mg Intravenous Q12H Tyrone Nine, MD   40 mg at 02/25/17 6203  . QUEtiapine (SEROQUEL) tablet 25 mg  25 mg Oral QHS Hammonds, Curt Jews, MD   25 mg at 02/24/17 2124  . sodium chloride flush (NS) 0.9 % injection 10-40 mL  10-40 mL Intracatheter Q12H Hammonds, Curt Jews, MD   10 mL at 02/25/17 0924  . sodium chloride flush (NS) 0.9 % injection 10-40 mL  10-40 mL Intracatheter PRN Hammonds, Curt Jews, MD   10 mL at 02/23/17 2205  . sodium chloride flush (NS) 0.9 % injection 3 mL  3 mL Intravenous Q12H Haydee Salter, MD   3 mL at 02/24/17 2125  . spironolactone (ALDACTONE) tablet 25 mg  25 mg Oral Daily Laurey Morale, MD   25 mg at 02/25/17 5597  . thiamine (VITAMIN B-1) tablet 100 mg  100 mg Oral Daily Hammonds, Curt Jews, MD   100 mg at 02/25/17 4163  . traMADol (ULTRAM) tablet 50 mg  50 mg Oral Q6H PRN Tyrone Nine, MD   50 mg at 02/24/17 2123  . Vitamin D (Ergocalciferol) (DRISDOL)  capsule 50,000 Units  50,000 Units Oral Q7 days Regalado, Belkys A, MD   50,000 Units at 02/25/17 1002     Discharge Medications: Please see discharge summary for a list of discharge medications.  Relevant Imaging Results:  Relevant Lab Results:   Additional Information SSN: [id number]  Wende Neighbors, LCSW

## 2017-02-25 NOTE — Progress Notes (Addendum)
PROGRESS NOTE    Stephen Fleming  ZOX:[id number] DOB: 1955-05-24 DOA: 02/10/2017 PCP: Louie Boston., MD    Brief Narrative: Stephen Fleming is a 62 y.o. male with a history of chronic HFrEF, alcohol abuse (stopped Nov 2018), CAD [facility] Nov 2018 showed diffuse stenoses managed medically), HTN, OSA intolerant of CPAP, and gout who presented to [facility] 1/21 with bilateral leg pain, hemoptysis, severe dyspnea on exertion, found to have bilateral LE DVT's and acute submassive PE on CTA with RV/LV of 1, right heart strain noted on TTE. He was placed on heparin, somewhat hypotensive and was transferred to [facility] for consideration of thrombolytics. No thrombolytics were indicated. Heart failure team was consulted for acute systolic LV and right heart failure. He experienced PAF with RVR since returning to [facility] on amiodarone.    Assessment & Plan:   Active Problems:   Acute on chronic systolic CHF (congestive heart failure) (HCC)   Dyspnea   Chronic combined systolic and diastolic CHF (congestive heart failure) (HCC)   DVT of lower extremity, bilateral (HCC)   Hemoptysis   Acute pulmonary embolism (HCC)   Occult blood in stools   Long term current use of anticoagulant therapy   Malnutrition of moderate degree   Pulmonary infiltrate   Loculated pleural effusion   1-Acute hypoxic respiratory failure: Due to PE, acute CHF.  Treated with IV heparin, lasix.  Stable. On RA<  CT chest with left lobe infection, vs infarct, loculated effusion.  Underwent CT guide thoracentesis 2-0, follow  Pleural fluid culture results no growth.   2-Acute submassive PE with right heart strain and bilateral acute DVTs: Suspect LLL atelectasis due to pulmonary infarct.  Was treated with IV heparin. Transition to Eliquis on 02-17-2017.  Now back on heparin.  Evaluated by CCM and IR. No EKOS. If hemoptysis get worse , could consider IVC filter.  Pulmonary following.  Continue with heparin. Change to eliquis if ok by GI Discussed  case with Dr hung, he reviewed chart. He agree to resume xarelto, patient high risk for procedure with submassive PE and EF 15 %. Monitor closely on xarelto.   Acute on chronic systolic CHF: EF worsened 20-25% -> 10-15%. Possibly related to chronic EtOH in addition to medically managed CAD. HF team following.  On oral lasix. Started on cozaar, spironolactone.   PNA; vs lung infarct. Leukocytosis; chest x ray with worsening infiltrates PNA vs infarct. Started IV antibiotics. CCM consulted again.  CT with consolidation and loculated pleural effusion. Underwent CT guide thoracentesis.  Vancomycin and cefepime day 7 Await pulmonologist evaluation.  Culture negative to date, await results.  Discussed with Dr Vassie Loll, he think patient has hemothorax. Ok to discontinue chest tube. Need to ask GI if we can use xarelto for anticoagulation.  follow chest x ray in am.   PAF with RVR:  On digoxin, amiodarone and eliquis.   CAD w/LBBB: [facility] Nov 2018 > medical management. - Continue statin, anticoagulation   ? Possible GI bleeding: Rusty stool +FOBT 1/26 due to occult GIB vs. swallowed hemoptysis. +FOBT. GI consulted, though symptoms resolved.  Hb has remain stable.  Will consider NM tagged RBC scan if gross GI bleed.  GI Consulted, Dr Damita Lack , evaluated patient. Occult blood positive might be related to hemoptysis.   Discussed with Dr Vassie Loll, he recommend GI recommendation regarding resumption of xarelto.   Right atrial thrombus:  - Transitioned to eliquis as above.   EtOH abuse: Not recently. CIWA discontinued without evidence of withdrawal. - Continue  supplements   Moderate protein calorie malnutrition:  - RD consulted, supplements as ordered.   Difficulty speech; per family patient speech is different since he has been in the hospital. CT head negative. Patient neuro exam is non focal.   Vitamin D deficiency ; started supplementation.   DVT prophylaxis: Eliquis  Code Status: full code.    Family Communication: care discussed with patient  Disposition Plan: discharge 2-06 if chest x ray is stable.    Consultants:   Cardiology   PCCM primary through 1/24      Procedures:  Echo1/22/2019 Left ventricle: The cavity size was moderately dilated. Wall thickness was normal. The estimated ejection fraction was in the range of 10% to 15%. Diffuse hypokinesis. The study is not technically sufficient to allow evaluation of LV diastolic function. - Regional wall motion abnormality: Akinesis of the basal-mid anteroseptal myocardium. - Aortic valve: There was mild regurgitation.  - Mitral valve: Mildly calcified annulus. There was mild regurgitation. - Left atrium: The atrium was severely dilated. - Right ventricle: The ventricular septum is flattend in systole and diastole consistent with RV pressure and volume overload. Systolic function was moderately reduced. TAPSE: 12.6 mm . - Right atrium: There is a semimobile linear structure in the right atrium that likely represents a thrombus. The atrium was moderately dilated. - Atrial septum: No defect or patent foramen ovale was identified. - Tricuspid valve: There was moderate regurgitation. - Pulmonary arteries: Systolic pressure was moderately increased. PA peak pressure: 46 mm Hg (S). - Inferior vena cava: The vessel was dilated. The respirophasic diameter changes were blunted (<50%), consistent with elevated central venous pressure.  ECHO 11/2016 EF 20-25% RV mild/mod dilated. RV normal.   LHC/RHC 12/04/2016  Prox RCA to Mid RCA lesion is 30% stenosed.  RPDA lesion is 50% stenosed.  Mid LAD lesion is 25% stenosed.  Prox LAD lesion is 20% stenosed.  Ost 1st Diag lesion is 60% stenosed.  Ost 1st Mrg lesion is 95% stenosed.  Ost 2nd Mrg lesion is 70% stenosed.  Hemodynamic findings consistent with moderate pulmonary hypertension. 1. CAD with 60% first diagonal, 95% ostial  small OM1, 70% OM2, and 50% mid PDA 2. Moderate pulmonary HTN with mean PAP 37 mm Hg 3. Moderately elevated LV filling pressures with PCWP of 29 mm Hg     Antimicrobials:  none   Subjective: He is alert, sitting in the chair. Cough , hemoptysis stable.   Objective: Vitals:   02/25/17 0330 02/25/17 0743 02/25/17 0900 02/25/17 1156  BP: 101/71 95/72  101/74  Pulse: 86 80 82 92  Resp: 18 19 (!) 26 13  Temp: 97.6 F (36.4 C)   97.7 F (36.5 C)  TempSrc: Oral   Axillary  SpO2: 94% 97% (!) 77% 100%  Weight: 76.3 kg (168 lb 3.4 oz)     Height:        Intake/Output Summary (Last 24 hours) at 02/25/2017 1324 Last data filed at 02/25/2017 1000 Gross per 24 hour  Intake 1433.5 ml  Output 1325 ml  Net 108.5 ml   Filed Weights   02/23/17 0500 02/24/17 0428 02/25/17 0330  Weight: 78 kg (171 lb 15.3 oz) 78 kg (171 lb 15.3 oz) 76.3 kg (168 lb 3.4 oz)    Examination:  General exam: Chronic ill appearing.  Respiratory system: Normal respiratory effort, Crackles bases.  Cardiovascular system: S 1, S 2 RRR Gastrointestinal system; BS present, soft, nt Central nervous system; alert,   Extremities:  Trace edema  Skin:  no rash     Data Reviewed: I have personally reviewed following labs and imaging studies  CBC: Recent Labs  Lab 02/21/17 0300 02/22/17 1044 02/23/17 0428 02/24/17 0748 02/25/17 1100  WBC 29.4* 20.7* 22.0* 20.9* 21.7*  HGB 12.9* 11.9* 11.8* 12.5* 12.1*  HCT 37.6* 35.5* 34.8* 36.8* 36.7*  MCV 92.2 92.7 92.3 92.7 94.3  PLT 320 291 322 314 353   Basic Metabolic Panel: Recent Labs  Lab 02/21/17 0300 02/22/17 0500 02/23/17 0428 02/24/17 0748 02/25/17 0502  NA 137 137 137 136 138  K 4.2 3.6 3.9 3.9 3.8  CL 99* 99* 101 101 102  CO2 27 27 26 24 24   GLUCOSE 114* 103* 124* 125* 119*  BUN 18 17 19 20  27*  CREATININE 0.55* 0.55* 0.55* 0.68 0.81  CALCIUM 8.0* 7.8* 7.8* 7.9* 8.0*   GFR: Estimated Creatinine Clearance: 97.6 mL/min (by C-G formula based on  SCr of 0.81 mg/dL). Liver Function Tests: No results for input(s): AST, ALT, ALKPHOS, BILITOT, PROT, ALBUMIN in the last 168 hours. No results for input(s): LIPASE, AMYLASE in the last 168 hours. No results for input(s): AMMONIA in the last 168 hours. Coagulation Profile: Recent Labs  Lab 02/21/17 0300  INR 1.75   Cardiac Enzymes: No results for input(s): CKTOTAL, CKMB, CKMBINDEX, TROPONINI in the last 168 hours. BNP (last 3 results) No results for input(s): PROBNP in the last 8760 hours. HbA1C: No results for input(s): HGBA1C in the last 72 hours. CBG: Recent Labs  Lab 02/19/17 0814 02/20/17 0837 02/25/17 0806  GLUCAP 90 103* 109*   Lipid Profile: No results for input(s): CHOL, HDL, LDLCALC, TRIG, CHOLHDL, LDLDIRECT in the last 72 hours. Thyroid Function Tests: Recent Labs    02/24/17 0748  TSH 3.491   Anemia Panel: Recent Labs    02/24/17 0748  VITAMINB12 1,458*   Sepsis Labs: No results for input(s): PROCALCITON, LATICACIDVEN in the last 168 hours.  Recent Results (from the past 240 hour(s))  Aerobic/Anaerobic Culture (surgical/deep wound)     Status: None (Preliminary result)   Collection Time: 02/21/17  9:49 AM  Result Value Ref Range Status   Specimen Description ABSCESS LEFT PLEURAL  Final   Special Requests Normal  Final   Gram Stain   Final    FEW WBC PRESENT, PREDOMINANTLY PMN NO ORGANISMS SEEN    Culture   Final    NO GROWTH 3 DAYS NO ANAEROBES ISOLATED; CULTURE IN PROGRESS FOR 5 DAYS Performed at [facility], [address] [place], [place] [address]    Report Status PENDING  Incomplete         Radiology Studies: Dg Chest Port 1 View  Result Date: 02/24/2017 CLINICAL DATA:  Left chest tube removal. EXAM: PORTABLE CHEST 1 VIEW COMPARISON:  Earlier same day. FINDINGS: Left chest tube is been removed. There is no pneumothorax. Persistent left pleural density with infiltrate and/or volume loss in the left lung. Right chest remains  clear. Right arm PICC tip at the SVC RA junction as seen previously. IMPRESSION: Left chest tube removed. No pneumothorax. Persistent left pleural density with left parenchymal infiltrate and/or volume loss as seen before. Electronically Signed   By: Paulina Fusi M.D.   On: 02/24/2017 17:13   Dg Chest Portable 1 View  Result Date: 02/24/2017 CLINICAL DATA:  Loculated pleural effusion on the left EXAM: PORTABLE CHEST 1 VIEW COMPARISON:  02/19/2017 FINDINGS: Right-sided PICC line is noted. Cardiac shadow is enlarged. A drainage catheter is noted on the  left in the previously seen loculated pleural effusion. It has demonstrated significant decrease in size although a small component remains. Left basilar infiltrative changes are noted similar to that seen on prior CT. The right lung remains clear. No bony abnormality is seen. IMPRESSION: Significant reduction in left loculated pleural effusion following drainage. Persistent left basilar infiltrate is noted. Electronically Signed   By: Alcide Clever M.D.   On: 02/24/2017 10:02        Scheduled Meds: . [START ON 02/26/2017] amiodarone  200 mg Oral Daily  . apixaban  10 mg Oral BID   Followed by  . [START ON 03/03/2017] apixaban  5 mg Oral BID  . atorvastatin  40 mg Oral q1800  . carvedilol  3.125 mg Oral BID WC  . digoxin  0.125 mg Oral Daily  . feeding supplement (ENSURE ENLIVE)  237 mL Oral QID  . folic acid  1 mg Oral Daily  . furosemide  40 mg Oral Daily  . losartan  12.5 mg Oral BID  . mometasone-formoterol  2 puff Inhalation BID  . multivitamin with minerals  1 tablet Oral Daily  . pantoprazole (PROTONIX) IV  40 mg Intravenous Q12H  . QUEtiapine  25 mg Oral QHS  . sodium chloride flush  10-40 mL Intracatheter Q12H  . sodium chloride flush  3 mL Intravenous Q12H  . spironolactone  25 mg Oral Daily  . thiamine  100 mg Oral Daily  . Vitamin D (Ergocalciferol)  50,000 Units Oral Q7 days   Continuous Infusions: . ceFEPime (MAXIPIME) IV 2 g  (02/25/17 0507)  . vancomycin 750 mg (02/25/17 0507)     LOS: 14 days    Time spent: 35 minutes.     Alba Cory, MD [facility] Pager [phone]  If 7PM-7AM, please contact night-coverage [email or web address] Password [email or web address] 02/25/2017, 1:24 PM

## 2017-02-25 NOTE — Progress Notes (Signed)
Physical Therapy Treatment Patient Details Name: Stephen Fleming MRN: [id number] DOB: 1955/10/22 Today's Date: 02/25/2017    History of Present Illness Pt is a 62 y.o. male with PMH of combined systolic/diastoic HF, CAD, moderate MR, HTN, OSA (intolerant CPAP), admitted 02/10/17 with bilateral leg pain and dyspnea. Found to have acute DVT/PE. CT chest 1/30 showing mod-large loculated left effusion and desnse LLL consolidation. 2/1 CT guided drain/thoracentesis and chest tube.    PT Comments    Pt performed increased gait and functional mobility during session this am.  Performed co-treat as patient is not receptive to mobility as he continues to fatigue quickly.  Pt however did follow commands with increased time and appeared motivated during session.  He remains to complain of pain when seated due to hemorrhoids.  Built up the seat to removed pressure at site of hemorrhoids.  Informed nursing to check on offloading cushion from central supply.  Plan for SNF remains appropriate for short term stay to improve strength and function before returning home.     Follow Up Recommendations  SNF;Supervision/Assistance - 24 hour     Equipment Recommendations  (TBD at next venue)    Recommendations for Other Services       Precautions / Restrictions Precautions Precautions: Fall Precaution Comments: chest tube removed 1/4, place gait belt low to avoid excision site.   Restrictions Weight Bearing Restrictions: No    Mobility  Bed Mobility               General bed mobility comments: Pt sitting on BSC on arrival.    Transfers Overall transfer level: Needs assistance Equipment used: Rolling walker (2 wheeled) Transfers: Sit to/from Stand Sit to Stand: Min assist;+2 safety/equipment         General transfer comment: Pt performed x2 reps from [facility] with cues for hand placement and assistance to lift.  Pt required cues to back to seated surface and reach back before sitting.  Good eccentric  loading returning to recliner chair.    Ambulation/Gait Ambulation/Gait assistance: Min assist;+2 safety/equipment Ambulation Distance (Feet): 90 Feet Assistive device: Rolling walker (2 wheeled) Gait Pattern/deviations: Step-through pattern;Decreased stride length;Trunk flexed Gait velocity: Decreased Gait velocity interpretation: Below normal speed for age/gender General Gait Details: Cues to stay close to RW, to step in RW and maintain forward gaze to improve posture.     Stairs            Wheelchair Mobility    Modified Rankin (Stroke Patients Only)       Balance Overall balance assessment: Needs assistance   Sitting balance-Leahy Scale: Fair       Standing balance-Leahy Scale: Poor Standing balance comment: relies on UE support and RW, would not remove hands from RW to attempt pericare.                              Cognition Arousal/Alertness: Awake/alert Behavior During Therapy: Flat affect Overall Cognitive Status: Difficult to assess Area of Impairment: Following commands;Safety/judgement                       Following Commands: Follows one step commands with increased time     Problem Solving: Requires verbal cues General Comments: Pt is very particular, increased time to follow cues, requires cues for problem solving during session      Exercises      General Comments        Pertinent  Vitals/Pain Pain Assessment: Faces Faces Pain Scale: Hurts little more Pain Location: outer anus due to hemorrhoids Pain Descriptors / Indicators: Discomfort Pain Intervention(s): Repositioned(built up sides of chairs to avoid pressure.  )    Home Living                      Prior Function            PT Goals (current goals can now be found in the care plan section) Acute Rehab PT Goals Patient Stated Goal: get better Potential to Achieve Goals: Good Progress towards PT goals: Progressing toward goals    Frequency     Min 2X/week      PT Plan Current plan remains appropriate    Co-evaluation PT/OT/SLP Co-Evaluation/Treatment: Yes Reason for Co-Treatment: Necessary to address cognition/behavior during functional activity PT goals addressed during session: Mobility/safety with mobility OT goals addressed during session: ADL's and self-care      AM-PAC PT "6 Clicks" Daily Activity  Outcome Measure  Difficulty turning over in bed (including adjusting bedclothes, sheets and blankets)?: A Little Difficulty moving from lying on back to sitting on the side of the bed? : A Little Difficulty sitting down on and standing up from a chair with arms (e.g., wheelchair, bedside commode, etc,.)?: Unable Help needed moving to and from a bed to chair (including a wheelchair)?: A Little Help needed walking in hospital room?: A Little Help needed climbing 3-5 steps with a railing? : A Lot 6 Click Score: 15    End of Session Equipment Utilized During Treatment: Gait belt Activity Tolerance: Patient limited by fatigue Patient left: with call bell/phone within reach;in chair;with chair alarm set;with nursing/sitter in room Nurse Communication: Mobility status PT Visit Diagnosis: Unsteadiness on feet (R26.81);Muscle weakness (generalized) (M62.81)     Time: 1610-9604 PT Time Calculation (min) (ACUTE ONLY): 27 min  Charges:  $Gait Training: 8-22 mins                    G Codes:       Joycelyn Rua, PTA pager [phone]    Florestine Avers 02/25/2017, 10:15 AM

## 2017-02-25 NOTE — Progress Notes (Signed)
[facility] PCCM:  Summary: 62 y/o male with systolic heart failure admitted with a PE and bilateral DVT  on 1/21 now with pleural effusion.   S: Chest tube placed by IR 2/1. Removed 2/4. Dressing to site  O:  Vitals:   02/25/17 0330 02/25/17 0743  BP: 101/71 95/72  Pulse: 86 80  Resp: 18 19  Temp: 97.6 F (36.4 C)   SpO2: 94% 97%   RA  General:  Tired, chronically ill appearing male, appears older than stated age, sitting on side of bed eating breakfast. Very deconditioned HEENT: MM pink/moist, NCAT Neuro: Awake and alert, appropriate CV: s1s2 rrr, no m/r/g PULM: even/non-labored on RA, diminished bases L>R, few scattered crackles, dressing to old L CT site. OJ:JKKX, non-tender, bsx4 active, very thin  Extremities: warm/dry, 1+ BLE edema, 2-3 second cap refill  Skin: no rashes or lesions, warm and dry  Cultures:  2/1>> L Pleural Fluid>>   CT chest 1/31>>> 1. Moderate to large loculated left pleural effusion. 2. Dense airspace consolidation involves the entire left lower lobe and posterior left upper lobe which may be the sequelae of pulmonary infarct and/or infection. 3. Mild subpleural consolidation in the posterior right middle lobe. 4. Aortic Atherosclerosis (ICD10-I70.0). Three vessel coronary artery calcifications noted.  CXR 2/4 Left chest tube is been removed. There is no pneumothorax. Persistent left pleural density with infiltrate and/or volume loss in the left lung. Right chest remains clear. Right arm PICC tip at the SVC RA junction as seen previously.  Impression/plan:  PE/ DVT with pulmonary infarct  RA thrombus on echo  Hemoptysis>> improving Fecal occult + blood Plan: Heparin off 2/4 Eliquis started 10 mg BID 2/4 x 7 days, then 5 mg BID Monitor hemoptysis  Trend CBC's Consider GI consult for risk of bleeding with long term anticoagulation Titrate oxygen for sats > 94% Will need follow up with pulmonary as outpatient  Infiltrate:  - suspect  infarct, possible pneumonia - Leukocytosis - Remains Afebrile  Plan:  Continue broad spectrum abx  F/u CXR 2/6  Trend fever/wbc curve  Consider narrow or d/c abx   COPD: Plan Continue duonebs, dulera  Saturation goal 88-92%   Pleural Effusion -   - Successful CT guided drainage of Left sided pleural effusion 02/21/2017 per IR for 240 cc slightly dark serous pleural  Fluid>> aspirated and sent to lab for analysis  to evaluate for hemothorax or infected parapneumonic effusion. - 10 Fr CT placed to water seal>>scant drainage. Initially drained 240, 350 in sahara>> removed 2/4 Plan: - Follow Micro and pathology, cytology of pleural fluid - no cytology back 2/5 - Follow CXR daily for re-accumulation  - If recollects/ continues to drain large amounts  will need to consider VATS    Bevelyn Ngo, AGACNP-BC 02/25/2017  9:01 AM Pager:  [phone]

## 2017-02-25 NOTE — Progress Notes (Signed)
Nutrition Follow-up  DOCUMENTATION CODES:   Non-severe (moderate) malnutrition in context of chronic illness  INTERVENTION:   -Continue Ensure Enlive po QID, each supplement provides 350 kcal and 20 grams of protein -MVI daily  NUTRITION DIAGNOSIS:   Moderate Malnutrition related to chronic illness(CHF) as evidenced by mild muscle depletion, energy intake < 75% for > or equal to 1 month, percent weight loss(5% weight loss in one month).  Ongoing  GOAL:   Patient will meet greater than or equal to 90% of their needs  Progressing  MONITOR:   PO intake, Supplement acceptance, Weight trends, Labs  REASON FOR ASSESSMENT:   Malnutrition Screening Tool    ASSESSMENT:   62 yo male with PMH of HTN, asthma, OSA, arthritis, CAD, pulmonary HTN, CHF, mitral regurgitation, who was admitted on 1/21 to [facility] with bilateral DVTs. Transferred to [facility] on 1/22.  1/30- trasnferred from ICU to SDU 2/1- lt chest tube placed 2/4- CT removed due to low ouput  Per PCCM notes, ptwith lt hemothorax due to pulmonary infarct. Pt may require VATS.   +169 ml x 24 hours, -8.4 L since admit  Pt remains with fair to poor intake; PO: 0-50%. Per discussion with RN, intake is better- consumed milk and cereal at breakfast and is consuming Ensure supplements.   Spoke with pt, who reports appetite is slowly improving. He confirms eating breakfast, cereal, milk, and orange juice this morning. He also really likes Ensure supplements. Discussed importance of consuming meals and supplement to promote healing.    Labs reviewed: CBGS: 109  Diet Order:  Diet Heart Room service appropriate? Yes; Fluid consistency: Thin  EDUCATION NEEDS:   Education needs have been addressed  Skin:  Skin Assessment: Reviewed RN Assessment  Last BM:  02/22/17  Height:   Ht Readings from Last 1 Encounters:  02/11/17 5\' 10"  (1.778 m)    Weight:   Wt Readings from Last 1 Encounters:  02/25/17 168 lb 3.4 oz (76.3 kg)     Ideal Body Weight:  75 kg  BMI:  Body mass index is 24.14 kg/m.  Estimated Nutritional Needs:   Kcal:  2000-2200  Protein:  100-115 gm  Fluid:  2 L    Lujean Ebright A. Mayford Knife, RD, LDN, CDE Pager: [phone] After hours Pager: [phone]

## 2017-02-26 ENCOUNTER — Inpatient Hospital Stay (HOSPITAL_COMMUNITY): Payer: BLUE CROSS/BLUE SHIELD

## 2017-02-26 DIAGNOSIS — I429 Cardiomyopathy, unspecified: Secondary | ICD-10-CM

## 2017-02-26 LAB — COOXEMETRY PANEL
Carboxyhemoglobin: 2.3 % — ABNORMAL HIGH (ref 0.5–1.5)
METHEMOGLOBIN: 0.7 % (ref 0.0–1.5)
O2 SAT: 78.4 %
TOTAL HEMOGLOBIN: 11.8 g/dL — AB (ref 12.0–16.0)

## 2017-02-26 LAB — AEROBIC/ANAEROBIC CULTURE W GRAM STAIN (SURGICAL/DEEP WOUND)
Culture: NO GROWTH
Special Requests: NORMAL

## 2017-02-26 LAB — BASIC METABOLIC PANEL
Anion gap: 12 (ref 5–15)
BUN: 32 mg/dL — AB (ref 6–20)
CHLORIDE: 102 mmol/L (ref 101–111)
CO2: 22 mmol/L (ref 22–32)
CREATININE: 0.89 mg/dL (ref 0.61–1.24)
Calcium: 7.8 mg/dL — ABNORMAL LOW (ref 8.9–10.3)
Glucose, Bld: 150 mg/dL — ABNORMAL HIGH (ref 65–99)
POTASSIUM: 3.8 mmol/L (ref 3.5–5.1)
SODIUM: 136 mmol/L (ref 135–145)

## 2017-02-26 LAB — AEROBIC/ANAEROBIC CULTURE (SURGICAL/DEEP WOUND)

## 2017-02-26 MED ORDER — LOSARTAN POTASSIUM 25 MG PO TABS: 12.5000 mg | ORAL_TABLET | Freq: Two times a day (BID) | ORAL | 0 refills | Status: AC

## 2017-02-26 MED ORDER — DIAZEPAM 2 MG PO TABS: 2.0000 mg | ORAL_TABLET | Freq: Two times a day (BID) | ORAL | 0 refills | Status: DC | PRN

## 2017-02-26 MED ORDER — HYDROCORTISONE ACETATE 25 MG RE SUPP: 25.0000 mg | Freq: Two times a day (BID) | RECTAL | 0 refills | Status: AC | PRN

## 2017-02-26 MED ORDER — APIXABAN 5 MG PO TABS: 5.0000 mg | ORAL_TABLET | Freq: Two times a day (BID) | ORAL | 1 refills | Status: AC

## 2017-02-26 MED ORDER — AMOXICILLIN-POT CLAVULANATE 875-125 MG PO TABS: 1.0000 | ORAL_TABLET | Freq: Two times a day (BID) | ORAL | 0 refills | Status: AC

## 2017-02-26 MED ORDER — HYDROCORTISONE ACETATE 25 MG RE SUPP
25.0000 mg | Freq: Two times a day (BID) | RECTAL | Status: DC | PRN
  Filled 2017-02-26: qty 1

## 2017-02-26 MED ORDER — VITAMIN D (ERGOCALCIFEROL) 1.25 MG (50000 UNIT) PO CAPS: 50000.0000 [IU] | ORAL_CAPSULE | ORAL | 0 refills | Status: AC

## 2017-02-26 MED ORDER — DIGOXIN 125 MCG PO TABS: 0.1250 mg | ORAL_TABLET | Freq: Every day | ORAL | 0 refills | Status: AC

## 2017-02-26 MED ORDER — APIXABAN 5 MG PO TABS: 10.0000 mg | ORAL_TABLET | Freq: Two times a day (BID) | ORAL | 0 refills | Status: AC

## 2017-02-26 MED ORDER — ADULT MULTIVITAMIN W/MINERALS CH: 1.0000 | ORAL_TABLET | Freq: Every day | ORAL | 0 refills | Status: AC

## 2017-02-26 MED ORDER — ENSURE ENLIVE PO LIQD: 237.0000 mL | Freq: Four times a day (QID) | ORAL | 12 refills | Status: AC

## 2017-02-26 MED ORDER — QUETIAPINE FUMARATE 25 MG PO TABS: 25.0000 mg | ORAL_TABLET | Freq: Every day | ORAL | 0 refills | Status: AC

## 2017-02-26 MED ORDER — FUROSEMIDE 40 MG PO TABS: 40.0000 mg | ORAL_TABLET | Freq: Every day | ORAL | 0 refills | Status: AC

## 2017-02-26 MED ORDER — THIAMINE HCL 100 MG PO TABS: 100.0000 mg | ORAL_TABLET | Freq: Every day | ORAL | 0 refills | Status: AC

## 2017-02-26 MED ORDER — GADOBENATE DIMEGLUMINE 529 MG/ML IV SOLN
25.0000 mL | Freq: Once | INTRAVENOUS | Status: AC | PRN
  Administered 2017-02-26: 25 mL via INTRAVENOUS

## 2017-02-26 MED ORDER — TRAMADOL HCL 50 MG PO TABS: 25.0000 mg | ORAL_TABLET | Freq: Two times a day (BID) | ORAL | 0 refills | Status: AC | PRN

## 2017-02-26 MED ORDER — AMIODARONE HCL 200 MG PO TABS: 200.0000 mg | ORAL_TABLET | Freq: Every day | ORAL | 0 refills | Status: AC

## 2017-02-26 MED ORDER — FOLIC ACID 1 MG PO TABS: 1.0000 mg | ORAL_TABLET | Freq: Every day | ORAL | 0 refills | Status: AC

## 2017-02-26 MED ORDER — POLYETHYLENE GLYCOL 3350 17 G PO PACK
17.0000 g | PACK | Freq: Every day | ORAL | Status: DC
  Administered 2017-02-26: 17 g via ORAL
  Filled 2017-02-26: qty 1

## 2017-02-26 MED ORDER — CARVEDILOL 3.125 MG PO TABS: 3.1250 mg | ORAL_TABLET | Freq: Two times a day (BID) | ORAL | 0 refills | Status: AC

## 2017-02-26 MED ORDER — SPIRONOLACTONE 25 MG PO TABS: 25.0000 mg | ORAL_TABLET | Freq: Every day | ORAL | 0 refills | Status: AC

## 2017-02-26 MED ORDER — ATORVASTATIN CALCIUM 40 MG PO TABS: 40.0000 mg | ORAL_TABLET | Freq: Every day | ORAL | 0 refills | Status: AC

## 2017-02-26 NOTE — Progress Notes (Signed)
Clinical Social Worker facilitated patient discharge including contacting patient family and facility to confirm patient discharge plans.  Clinical information faxed to facility and family agreeable with plan.  CSW arranged ambulance transport via [facility] to [facility] .  RN to call [phone] (pt will go in room 512) report prior to discharge.  Clinical Social Worker will sign off for now as social work intervention is no longer needed. Please consult [place] again if new need arises.  Marrianne Mood, MSW, [employer] [phone]

## 2017-02-26 NOTE — Progress Notes (Signed)
Patient has bed at [facility] once medically cleared. Patient will go with a 5 day LOG since authorization through insurance has not yet been attained.   Marrianne Mood, MSW,  [employer] [phone]

## 2017-02-26 NOTE — Progress Notes (Signed)
Patient ID: Stephen Fleming, male   DOB: 11-11-1955, 62 y.o.   MRN: [id number]     Advanced Heart Failure Rounding Note  HF Cardiology: Dr Shirlee Latch  Subjective:   Yesterday he ambulated 90 feet with PT.   Complaining of fatigue. Denies SOB.   CT chest (1/30) with moderate-large loculated left effusion + dense LLL consolidation, ?pulmonary infarct => chest tube placed.     Objective:   Weight Range: 167 lb 15.9 oz (76.2 kg) Body mass index is 24.1 kg/m.   Vital Signs:   Temp:  [97.6 F (36.4 C)-97.7 F (36.5 C)] 97.6 F (36.4 C) (02/06 0302) Pulse Rate:  [82-94] 91 (02/06 0302) Resp:  [13-33] 28 (02/06 0302) BP: (94-101)/(69-74) 94/74 (02/06 0302) SpO2:  [77 %-100 %] 99 % (02/06 0825) Weight:  [167 lb 15.9 oz (76.2 kg)] 167 lb 15.9 oz (76.2 kg) (02/06 0302) Last BM Date: 02/22/17  Weight change: Filed Weights   02/24/17 0428 02/25/17 0330 02/26/17 0302  Weight: 171 lb 15.3 oz (78 kg) 168 lb 3.4 oz (76.3 kg) 167 lb 15.9 oz (76.2 kg)    Intake/Output:   Intake/Output Summary (Last 24 hours) at 02/26/2017 0835 Last data filed at 02/26/2017 0526 Gross per 24 hour  Intake 1140 ml  Output 1550 ml  Net -410 ml      Physical Exam  CVP 5.  General:  Thin. Appears chronically ill.  No resp difficulty HEENT: normal Neck: supple. no JVD. Carotids 2+ bilat; no bruits. No lymphadenopathy or thryomegaly appreciated. Cor: PMI nondisplaced. Regular rate & rhythm. No rubs, gallops or murmurs. Lungs: clear Abdomen: soft, nontender, nondistended. No hepatosplenomegaly. No bruits or masses. Good bowel sounds. Extremities: no cyanosis, clubbing, rash, edema. RUE PICC  Neuro: alert & orientedx3, cranial nerves grossly intact. moves all 4 extremities w/o difficulty. Affect flat.     Telemetry  NSR 80s personally reviewed.   EKG    No new tracings.  Labs    CBC Recent Labs    02/24/17 0748 02/25/17 1100  WBC 20.9* 21.7*  HGB 12.5* 12.1*  HCT 36.8* 36.7*  MCV 92.7 94.3  PLT  314 353   Basic Metabolic Panel Recent Labs    74/94/49 0502 02/26/17 0513  NA 138 136  K 3.8 3.8  CL 102 102  CO2 24 22  GLUCOSE 119* 150*  BUN 27* 32*  CREATININE 0.81 0.89  CALCIUM 8.0* 7.8*   Liver Function Tests No results for input(s): AST, ALT, ALKPHOS, BILITOT, PROT, ALBUMIN in the last 72 hours. No results for input(s): LIPASE, AMYLASE in the last 72 hours. Cardiac Enzymes No results for input(s): CKTOTAL, CKMB, CKMBINDEX, TROPONINI in the last 72 hours.  BNP: BNP (last 3 results) Recent Labs    12/04/16 0030  BNP 1,027.4*    ProBNP (last 3 results) No results for input(s): PROBNP in the last 8760 hours.   D-Dimer No results for input(s): DDIMER in the last 72 hours. Hemoglobin A1C No results for input(s): HGBA1C in the last 72 hours. Fasting Lipid Panel No results for input(s): CHOL, HDL, LDLCALC, TRIG, CHOLHDL, LDLDIRECT in the last 72 hours. Thyroid Function Tests Recent Labs    02/24/17 0748  TSH 3.491    Other results:  Imaging   No results found.  Medications:     Scheduled Medications: . amiodarone  200 mg Oral Daily  . apixaban  10 mg Oral BID   Followed by  . [START ON 03/03/2017] apixaban  5 mg Oral BID  .  atorvastatin  40 mg Oral q1800  . carvedilol  3.125 mg Oral BID WC  . digoxin  0.125 mg Oral Daily  . feeding supplement (ENSURE ENLIVE)  237 mL Oral QID  . folic acid  1 mg Oral Daily  . furosemide  40 mg Oral Daily  . losartan  12.5 mg Oral BID  . mometasone-formoterol  2 puff Inhalation BID  . multivitamin with minerals  1 tablet Oral Daily  . pantoprazole (PROTONIX) IV  40 mg Intravenous Q12H  . polyethylene glycol  17 g Oral Daily  . QUEtiapine  25 mg Oral QHS  . sodium chloride flush  10-40 mL Intracatheter Q12H  . sodium chloride flush  3 mL Intravenous Q12H  . spironolactone  25 mg Oral Daily  . thiamine  100 mg Oral Daily  . Vitamin D (Ergocalciferol)  50,000 Units Oral Q7 days    Infusions: . ceFEPime  (MAXIPIME) IV Stopped (02/26/17 0556)    PRN Medications: acetaminophen **OR** acetaminophen, albuterol, diazepam, hydrocortisone, ondansetron **OR** ondansetron (ZOFRAN) IV, sodium chloride flush, traMADol  Patient Profile   Stephen Fleming is a 26 year old with history of  Combined systolic/diastoic heart failure, CAD (cath in 11/2016 showing60% first diagonal, 95% ostial small OM1, 70% OM2, and 50% mid PDA--> medical management recommended, moderate Stephen, HTN, OSA intolerant CPAP.   Admitted with bilateral leg pain and dyspnea. Acute DVT/PE  Assessment/Plan   1. Bilateral DVTs with PE and pulmonary infarction: Did not have thrombolysis.  Thrombus in transit seen in RA on echo. Still with scant hemoptysis.   CT chest concerning for pulmonary infarction on left as well as left loculated effusion.   On apixaban 10 mg twice a day until 2/11 am dose then 5 mg twice a day starting 2/11 pm dose.  2. Acute on chronic systolic CHF: Nonischemic cardiomyopathy, out of proportion to degree of CAD.  May be related to prior heavy ETOH.  Echo (1/19) with moderate LV dilation, EF 10-15%, moderately decreased RV systolic function, D-shaped interventricular septum suggestive of RV pressure/volume overload, PASP 46 mmHg.  Low output HF by RHC in 11/18 (CI 1.65, mean PCWP 29).   - CO-OX 78%. Continue current regimen. Volume status stable.   -Continue lasix 40 mg daily.  - Continue Coreg 3.125 mg bid.  - Continue digoxin 0.125 mg daily - Continue losartan 12.5 mg bid.  - Continue spironolactone 25 mg daily.  - Off ivabradine with paroxysmal atrial fibrillation.  - Noted to have wide LBBB => CRT may be beneficial down the road.  - Can get cardiac MRI this admission prior to discharge given cardiomyopathy of uncertain etiology.  3. CAD: Patient had extensive CAD on 11/18 cath but primarily nonobstructive.  Does not explain cardiomyopathy.  -No s/s ischemia    - Continue statin.  4. Suspected atrial fibrillation  with RVR:  -Maintaining NSR.  - Continue amio 200 mg daily.   - On eliquis as above.  5. Heme+ stool:  - Hgb stable 12.1   6. ID:  -- BCs 23, cannot rule out PNA/HCAP, covering with vancomycin/cefepime.  7. Left pleural effusion: Loculated, moderate-large.   CT placed 02/21/2017. CT removed 2/4 8. Severe Deconditioning:  - To SNF on discharge.  HF medications for D/C Amio 200 mg daily Eliquis 10  mg twice daily until 2/11am dose then 5 mg twice daily starting 2/11 pm.  Coreg 3.125 mg twice a day Lasix 40 mg daily Losartan 12.5 mg twice a day Spiro 25  mg daily  HF follow up set up.    Tonye Becket, NP  02/26/2017 8:35 AM   Advanced Heart Failure Team Pager [phone] (M-F; 7a - 4p)  Please contact [facility] for night-coverage after hours (4p -7a ) and weekends on [email or web address]  Patient seen with NP, agree with the above note.  He is stable on the current cardiac regimen, not volume overloaded on exam.  He is now on Eliquis for large PE.   He can stop antibiotics today, has been on vancomycin/cefepime for a week.   I will get his cardiac MRI today to assess for infiltrative disease.  He will need a rehab stay for deconditioning.    [name] 02/26/2017 9:03 AM

## 2017-02-26 NOTE — Progress Notes (Signed)
RN called report to receiving staff, Serina Cowper, at [facility].

## 2017-02-26 NOTE — Progress Notes (Signed)
Pt d/c with PTAR, VSS, A/O x4, PICC removed around at shift change. All belongings with pt. Report given to [facility] RN all questions answered.

## 2017-02-26 NOTE — Discharge Summary (Addendum)
Physician Discharge Summary  Stephen Fleming WUJ:[id number] DOB: [birth year] DOA: 02/10/2017  PCP: Louie Boston., MD  Admit date: 02/10/2017 Discharge date: 02/26/2017  Admitted From: Home  Disposition:  SNF  Recommendations for Outpatient Follow-up:  1. Follow up with PCP in 1-2 weeks 2. Please obtain BMP/CBC in one week 3. Needs Chest x ray in 1 week to follow pleural effusion.  4. Needs repeat Vitamin D level in 4 weeks.  5. Needs follow up with HF team.  6. Follow results of cardiac MRI      Discharge Condition: Stable.  CODE STATUS: full code.  Diet recommendation: Heart Healthy   Brief/Interim Summary:  Brief Narrative: Stephen Fleming a 62 y.o.malewith a history of chronic HFrEF, alcohol abuse (stopped Nov 2018), CAD (LHC Nov 2018 showed diffuse stenoses managed medically), HTN, OSA intolerant of CPAP, and gout who presented to [facility] 1/21 with bilateral leg pain, hemoptysis, severe dyspnea on exertion, found to have bilateral LE DVT's and acute submassive PE on CTA with RV/LV of 1, right heart strain noted on TTE. He was placed on heparin, somewhat hypotensive and was transferred to [facility] for consideration of thrombolytics. No thrombolytics were indicated. Heart failure team was consulted for acute systolic LV and right heart failure.He experienced PAF with RVR since returning to [facility] on amiodarone.   Assessment & Plan:   Active Problems:   Acute on chronic systolic and diastolic CHF (congestive heart failure) (HCC)   Dyspnea   DVT of lower extremity, bilateral (HCC)   Hemoptysis   Acute pulmonary embolism (HCC)   Occult blood in stools   Long term current use of anticoagulant therapy   Malnutrition of moderate degree   Pulmonary infiltrate   Loculated pleural effusion   1-Acute hypoxic respiratory failure: Due to PE, acute CHF. Present on admission. Hemothorax, PNA Treated with IV heparin, lasix.  Stable. On RA<  CT chest with left lobe infection, vs infarct,  loculated effusion.  Underwent CT guide thoracentesis 2-0, follow  Pleural fluid culture results no growth.  Chest x ray stable., need chest x ray in 4 weeks.   2-Acute submassive PE with right heart strain and bilateral acute DVTs: Suspect LLL atelectasis due to pulmonary infarct.  Was treated with IV heparin. Transition to Eliquis on 02-17-2017.  Now back on heparin.  Evaluated by CCM and IR. No EKOS. If hemoptysis get worse , could consider IVC filter.  Pulmonary following.  Continue with heparin. Change to eliquis if ok by GI Discussed case with Dr hung, he reviewed chart. He agree to resume xarelto, patient high risk for procedure with submassive PE and EF 15 %. Monitor closely on xarelto.  plan for cardiac MRI prior to discharge., Discussed with cardiology, we don't have to wait for result of cardiac MRI to discharge patient.,   Acute on chronic systolic and diastolic CHF exacerbation : EF worsened 20-25% -> 10-15%. Possibly related to chronic EtOH in addition to medically managed CAD. HF team following.  On oral lasix. Started on cozaar, spironolactone.   PNA; vs lung infarct. Hemothorax. Leukocytosis; chest x ray with worsening infiltrates PNA vs infarct. Started IV antibiotics. CCM consulted again.  PNA vs lung infarct develops during hospitalization.  CT with consolidation and loculated pleural effusion. Underwent CT guide thoracentesis.  Treated with Vancomycin and cefepime day 7. Discharge on Augmentin for 5 more days..  Culture negative to date, await results.  Discussed with Dr Vassie Loll, he think patient has hemothorax. Ok to discontinue chest tube. Need to  ask GI if we can use eliquis., for anticoagulation.  Chest x ray with stable effusion, discussed with Dr Greer Pickerel, patient can be discharge to SNF , needs repeat chest x ray in 1 week.   PAF with RVR:  On digoxin, amiodarone and eliquis.   CAD w/LBBB: [facility] Nov 2018> medical management. - Continue statin,  anticoagulation   ? Possible GI bleeding: Rusty stool +FOBT 1/26 due to occult GIB vs. swallowed hemoptysis. +FOBT. GI consulted, though symptoms resolved.  Hb has remain stable.  Will consider NM tagged RBC scan if gross GI bleed.  GI Consulted, Dr Damita Lack , evaluated patient. Occult blood positive might be related to hemoptysis.  Discussed with Dr Vassie Loll, he recommend GI recommendation regarding resumption of xarelto.   Right atrial thrombus:  -Transitionedto eliquis as above.   EtOH abuse: Not recently. CIWA discontinued without evidence of withdrawal. - Continue supplements  Moderate protein calorie malnutrition:  - RD consulted, supplements as ordered.  Difficulty speech; per family patient speech is different since he has been in the hospital. CT head negative. Patient neuro exam is non focal.   Vitamin D deficiency ; started supplementation.      Discharge Diagnoses:  Active Problems:   Acute on chronic systolic CHF (congestive heart failure) (HCC)   Dyspnea   Chronic combined systolic and diastolic CHF (congestive heart failure) (HCC)   DVT of lower extremity, bilateral (HCC)   Hemoptysis   Acute pulmonary embolism (HCC)   Occult blood in stools   Long term current use of anticoagulant therapy   Malnutrition of moderate degree   Pulmonary infiltrate   Loculated pleural effusion    Discharge Instructions   Allergies as of 02/26/2017   No Known Allergies     Medication List    STOP taking these medications   aspirin 81 MG tablet   benzonatate 200 MG capsule Commonly known as:  TESSALON   metoprolol succinate 25 MG 24 hr tablet Commonly known as:  TOPROL XL   potassium chloride SA 20 MEQ tablet Commonly known as:  K-DUR,KLOR-CON     TAKE these medications   albuterol 108 (90 Base) MCG/ACT inhaler Commonly known as:  PROVENTIL HFA;VENTOLIN HFA Inhale 2 puffs into the lungs every 6 (six) hours as needed for wheezing.   amiodarone 200 MG  tablet Commonly known as:  PACERONE Take 1 tablet (200 mg total) by mouth daily.   amoxicillin-clavulanate 875-125 MG tablet Commonly known as:  AUGMENTIN Take 1 tablet by mouth 2 (two) times daily for 5 days.   apixaban 5 MG Tabs tablet Commonly known as:  ELIQUIS Take 2 tablets (10 mg total) by mouth 2 (two) times daily.   apixaban 5 MG Tabs tablet Commonly known as:  ELIQUIS Take 1 tablet (5 mg total) by mouth 2 (two) times daily. Start taking on:  03/03/2017   atorvastatin 40 MG tablet Commonly known as:  LIPITOR Take 1 tablet (40 mg total) by mouth daily at 6 PM.   carvedilol 3.125 MG tablet Commonly known as:  COREG Take 1 tablet (3.125 mg total) by mouth 2 (two) times daily with a meal.   digoxin 0.125 MG tablet Commonly known as:  LANOXIN Take 1 tablet (0.125 mg total) by mouth daily.   feeding supplement (ENSURE ENLIVE) Liqd Take 237 mLs by mouth 4 (four) times daily.   Fluticasone-Salmeterol 250-50 MCG/DOSE Aepb Commonly known as:  ADVAIR Inhale 1 puff into the lungs every 12 (twelve) hours.   folic acid 1 MG  tablet Commonly known as:  FOLVITE Take 1 tablet (1 mg total) by mouth daily.   furosemide 40 MG tablet Commonly known as:  LASIX Take 1 tablet (40 mg total) by mouth daily. What changed:    medication strength  how much to take   hydrocortisone 25 MG suppository Commonly known as:  ANUSOL-HC Place 1 suppository (25 mg total) rectally 2 (two) times daily as needed for hemorrhoids or anal itching.   losartan 25 MG tablet Commonly known as:  COZAAR Take 0.5 tablets (12.5 mg total) by mouth 2 (two) times daily. What changed:    medication strength  how much to take  when to take this   multivitamin with minerals Tabs tablet Take 1 tablet by mouth daily.   polyethylene glycol powder powder Commonly known as:  GLYCOLAX/MIRALAX Take 17 g daily by mouth.   QUEtiapine 25 MG tablet Commonly known as:  SEROQUEL Take 1 tablet (25 mg total)  by mouth at bedtime.   spironolactone 25 MG tablet Commonly known as:  ALDACTONE Take 1 tablet (25 mg total) by mouth daily.   thiamine 100 MG tablet Take 1 tablet (100 mg total) by mouth daily.   traMADol 50 MG tablet Commonly known as:  ULTRAM Take 0.5 tablets (25 mg total) by mouth every 12 (twelve) hours as needed for moderate pain or severe pain.       No Known Allergies  Consultations:  Cardiology  CCM     Procedures/Studies: Dg Chest 2 View  Result Date: 02/10/2017 CLINICAL DATA:  Swelling and pain in legs. Congestive heart failure. EXAM: CHEST  2 VIEW COMPARISON:  Two-view chest x-ray 12/03/2016. FINDINGS: Heart is enlarged. There is no edema or effusion. Left lower lobe airspace disease is noted. The visualized soft tissues and bony thorax are unremarkable. IMPRESSION: 1. Cardiomegaly without failure. 2. Ill-defined left lower lobe airspace disease is concerning for early pneumonia. Electronically Signed   By: Marin Roberts M.D.   On: 02/10/2017 17:02   Ct Head Wo Contrast  Result Date: 02/22/2017 CLINICAL DATA:  Altered mental status. Speech difficulty. On anticoagulation. EXAM: CT HEAD WITHOUT CONTRAST TECHNIQUE: Contiguous axial images were obtained from the base of the skull through the vertex without intravenous contrast. COMPARISON:  Head CT 03/15/2009 and MRI 06/01/2007 FINDINGS: Brain: There is no evidence of acute infarct, intracranial hemorrhage, mass, midline shift, or extra-axial fluid collection. Mild-to-moderate cerebral atrophy has progressed from 2011 and is advanced for age. There is mild cerebellar atrophy. Vascular: Calcified atherosclerosis at the skull base. No hyperdense vessel. Skull: No fracture or focal osseous lesion. Sinuses/Orbits: Minimal scattered paranasal sinus mucosal thickening. Clear mastoid air cells. Unremarkable orbits. Other: None. IMPRESSION: 1. No evidence of acute intracranial abnormality. 2. Progressive cerebral atrophy.  Electronically Signed   By: Sebastian Ache M.D.   On: 02/22/2017 15:50   Ct Chest Wo Contrast  Result Date: 02/19/2017 CLINICAL DATA:  Acute respiratory illness.  Pleural effusion. EXAM: CT CHEST WITHOUT CONTRAST TECHNIQUE: Multidetector CT imaging of the chest was performed following the standard protocol without IV contrast. COMPARISON:  02/11/2017 FINDINGS: Cardiovascular: There is moderate cardiac enlargement. Aortic atherosclerosis. Calcification in the LAD and left circumflex and RCA coronary arteries noted. Mediastinum/Nodes: The trachea appears patent and is midline. Normal appearance of the esophagus. Multiple prominent, non pathologically enlarged mediastinal lymph nodes identified. No axillary or supraclavicular adenopathy. Lungs/Pleura: There is a moderate to large loculated left pleural effusion identified which is new from the previous exam. There is dense airspace  consolidation involving the entire left lower lobe and the posterior left upper lobe. Mild subpleural consolidation within the posterior right middle lobe noted, image 17 of series 7. Upper Abdomen: No acute abnormality. Musculoskeletal: Degenerative disc disease identified within the thoracic spine. No aggressive lytic or sclerotic bone lesions. IMPRESSION: 1. Moderate to large loculated left pleural effusion. 2. Dense airspace consolidation involves the entire left lower lobe and posterior left upper lobe which may be the sequelae of pulmonary infarct and/or infection. 3. Mild subpleural consolidation in the posterior right middle lobe. 4. Aortic Atherosclerosis (ICD10-I70.0). Three vessel coronary artery calcifications noted. Electronically Signed   By: Signa Kell M.D.   On: 02/19/2017 15:32   Ct Angio Chest Pe W Or Wo Contrast  Addendum Date: 02/11/2017   ADDENDUM REPORT: 02/11/2017 15:54 ADDENDUM: I discussed the findings of this study with Dr. Arbutus Leas at 3:52 p.m. Electronically Signed   By: Dwyane Dee M.D.   On: 02/11/2017  15:54   Result Date: 02/11/2017 CLINICAL DATA:  Short of breath, coughing up blood, evaluate for pulmonary embolism EXAM: CT ANGIOGRAPHY CHEST WITH CONTRAST TECHNIQUE: Multidetector CT imaging of the chest was performed using the standard protocol during bolus administration of intravenous contrast. Multiplanar CT image reconstructions and MIPs were obtained to evaluate the vascular anatomy. CONTRAST:  ISOVUE-370 IOPAMIDOL (ISOVUE-370) INJECTION 76% COMPARISON:  Chest x-ray of 02/10/2017 FINDINGS: Cardiovascular: The pulmonary arteries are well opacified. There are extensive bilateral pulmonary emboli. There is a large embolus almost completely occluding the distal left main pulmonary artery extending into the branches to the upper lobe, lower lobe, and lingula. On the right a large embolus also is partially occlusive within the distal portion of the main right pulmonary artery. This thrombus also extends into the right upper lobe, right middle lobe, and right lower lobe. The RV/LV ratio is approximately 1 which implies the patient is at risk for increased morbidity and mortality. The LV is difficult to evaluate due to poor opacification with contrast media. There is moderate cardiomegaly present. No pericardial effusion is seen. Coronary artery calcifications are noted diffusely. Mediastinum/Nodes: No mediastinal or hilar adenopathy is seen. The thyroid gland is unremarkable. No hiatal hernia is seen. Lungs/Pleura: On lung window images there is parenchymal infiltrate primarily peripherally within much of the left lower lobe and to a lesser degree within the posterosuperior aspect of the left upper lobe. Although these could represent foci of pneumonia, developing pulmonary infarction would also be a consideration. The right lung is clear. No pleural effusion is seen. Upper Abdomen: The portion of the upper abdomen that is visualized is unremarkable. There may be some atrophy of the left kidney with  apparent compensatory hypertrophy of the right kidney. Also there may be a cyst emanating from the mid right kidney peripherally which is difficult to assess on this unenhanced study. Musculoskeletal: The thoracic vertebrae are in normal alignment. No compression deformity is seen. There are degenerative changes in the mid to lower thoracic spine. Review of the MIP images confirms the above findings. IMPRESSION: 1. Extensive partially occlusive bilateral pulmonary emboli with RV/LV ratio of 1.0 implying increased morbidity mortality. Positive for acute PE with CT evidence of right heart strain (RV/LV Ratio = 1.0) consistent with at least submassive (intermediate risk) PE. The presence of right heart strain has been associated with an increased risk of morbidity and mortality. Please activate Code PE by paging [phone]. 2. Parenchymal opacity within much of the left lower lobe and a small portion of the  posterosuperior left upper lobe could represent pneumonia or developing infarction. No pleural effusion. 3. Apparent partial atrophy of the left kidney with some compensatory hypertrophy of the right kidney. Low-attenuation structure emanates from the periphery the right mid kidney difficult to assess on this study. Consider ultrasound to assess further. Electronically Signed: By: Dwyane Dee M.D. On: 02/11/2017 15:47   US Venous Img Lower Bilateral  Result Date: 02/10/2017 CLINICAL DATA:  Bilateral lower extremity pain and swelling. EXAM: BILATERAL LOWER EXTREMITY VENOUS DOPPLER ULTRASOUND TECHNIQUE: Gray-scale sonography with compression, as well as color and duplex ultrasound, were performed to evaluate the deep venous system from the level of the common femoral vein through the popliteal and proximal calf veins. COMPARISON:  None FINDINGS: On the right, there is occlusive thrombus in the popliteal vein. There thrombus in the femoral vein. This is occlusive in the mid and distal segments. The thrombus  extends to its confluence with the deep femoral vein. There is gastrocnemius DVT. The posterior tibial vein is occluded and noncompressible. Peroneal veins are not well seen. There is subcutaneous edema. Visualized segments of the saphenous venous system normal in caliber and compressibility. On the left, there is thrombus in the popliteal vein which is occlusive and noncompressible. There is posterior tibial vein DVT, occlusive and noncompressible. Visualized segments of the saphenous venous system normal in caliber and compressibility. IMPRESSION: 1. Occlusive DVT in LEFT posterior tibial and popliteal veins. 2. Occlusive DVT in RIGHT posterior tibial, popliteal, and femoral veins. Electronically Signed   By: Corlis Leak M.D.   On: 02/10/2017 18:15   Dg Chest Port 1 View  Result Date: 02/24/2017 CLINICAL DATA:  Left chest tube removal. EXAM: PORTABLE CHEST 1 VIEW COMPARISON:  Earlier same day. FINDINGS: Left chest tube is been removed. There is no pneumothorax. Persistent left pleural density with infiltrate and/or volume loss in the left lung. Right chest remains clear. Right arm PICC tip at the SVC RA junction as seen previously. IMPRESSION: Left chest tube removed. No pneumothorax. Persistent left pleural density with left parenchymal infiltrate and/or volume loss as seen before. Electronically Signed   By: Paulina Fusi M.D.   On: 02/24/2017 17:13   Dg Chest Portable 1 View  Result Date: 02/24/2017 CLINICAL DATA:  Loculated pleural effusion on the left EXAM: PORTABLE CHEST 1 VIEW COMPARISON:  02/19/2017 FINDINGS: Right-sided PICC line is noted. Cardiac shadow is enlarged. A drainage catheter is noted on the left in the previously seen loculated pleural effusion. It has demonstrated significant decrease in size although a small component remains. Left basilar infiltrative changes are noted similar to that seen on prior CT. The right lung remains clear. No bony abnormality is seen. IMPRESSION: Significant  reduction in left loculated pleural effusion following drainage. Persistent left basilar infiltrate is noted. Electronically Signed   By: Alcide Clever M.D.   On: 02/24/2017 10:02   Dg Chest Port 1 View  Result Date: 02/19/2017 CLINICAL DATA:  Leukocytosis, cough and congestion. EXAM: PORTABLE CHEST 1 VIEW COMPARISON:  CT chest 02/11/2017 and chest radiograph 02/10/2017. FINDINGS: Trachea is midline. Heart is enlarged. Right PICC tip projects over the low SVC. Marked increase in airspace consolidation in the left upper and left lower lobes, with a left pleural effusion. Right lung is grossly clear. IMPRESSION: 1. Marked worsening in left upper and left lower lobe consolidation, worrisome for pneumonia. Combination of pneumonia and infarct is also considered, given recent diagnosis of pulmonary emboli. 2. Left pleural effusion. Electronically Signed   By:  Leanna Battles M.D.   On: 02/19/2017 10:03   Ct Image Guided Drainage By Percutaneous Catheter  Result Date: 02/21/2017 INDICATION: Admitted with pulmonary embolism, now with worsening extensive heterogeneous/consolidative opacities within the left lung with associated partially loculated left-sided pleural effusion. Please perform CT-guided left-sided chest tube placement for infection source control purposes. EXAM: CT IMAGE GUIDED DRAINAGE BY PERCUTANEOUS CATHETER COMPARISON:  None. MEDICATIONS: The patient is currently admitted to the hospital and receiving intravenous antibiotics. The antibiotics were administered within an appropriate time frame prior to the initiation of the procedure. ANESTHESIA/SEDATION: Moderate (conscious) sedation was employed during this procedure. A total of Versed 0.5 mg and Fentanyl 25 mcg was administered intravenously. Moderate Sedation Time: 21 minutes. The patient's level of consciousness and vital signs were monitored continuously by radiology nursing throughout the procedure under my direct supervision. CONTRAST:  None  COMPLICATIONS: None immediate. PROCEDURE: Informed written consent was obtained from the patient after a discussion of the risks, benefits and alternatives to treatment. The patient was placed supine on the CT gantry and a pre procedural CT was performed re-demonstrating the known moderate-sized partially loculated left-sided pleural effusion the procedure was planned. A timeout was performed prior to the initiation of the procedure. The skin along the left inferolateral chest wall was prepped and draped in the usual sterile fashion. The overlying soft tissues were anesthetized with 1% lidocaine with epinephrine. Appropriate trajectory was planned with the use of a 22 gauge spinal needle. An 18 gauge trocar needle was advanced into the left pleural space and a short Amplatz super stiff wire was coiled within the left pleural space. Appropriate positioning was confirmed with a limited CT scan. The tract was serially dilated allowing placement of a 10 [place] all-purpose drainage catheter. Appropriate positioning was confirmed with a limited postprocedural CT scan. Approximately 240 cc slightly dark brown serous fluid was aspirated. The tube was connected to a pleural vac and sutured in place. A dressing was placed. The patient tolerated the procedure well without immediate post procedural complication. IMPRESSION: Successful CT guided placement of a 10 French all purpose drain catheter into the left pleural space with aspiration of 240 cc of slightly dark brown serous pleural fluid. Samples were sent to the laboratory as requested by the ordering clinical team. Electronically Signed   By: Simonne Come M.D.   On: 02/21/2017 10:26       Subjective: He is alert, needs suppository for hemorrhoid. No bleeding.   Discharge Exam: Vitals:   02/25/17 2305 02/26/17 0302  BP: 95/69 94/74  Pulse: 89 91  Resp: 20 (!) 28  Temp: 97.6 F (36.4 C) 97.6 F (36.4 C)  SpO2: 97% 99%   Vitals:   02/25/17 1613 02/25/17  1918 02/25/17 2305 02/26/17 0302  BP: 94/74 99/71 95/69  94/74  Pulse: 88 86 89 91  Resp: 14 (!) 23 20 (!) 28  Temp: 97.6 F (36.4 C) 97.6 F (36.4 C) 97.6 F (36.4 C) 97.6 F (36.4 C)  TempSrc: Oral Oral Oral Oral  SpO2: 98% 99% 97% 99%  Weight:    76.2 kg (167 lb 15.9 oz)  Height:        General: Pt is alert, awake, not in acute distress Cardiovascular: RRR, S1/S2 +, no rubs, no gallops Respiratory: CTA bilaterally, no wheezing, no rhonchi Abdominal: Soft, NT, ND, bowel sounds + Extremities: no edema, no cyanosis    The results of significant diagnostics from this hospitalization (including imaging, microbiology, ancillary and laboratory) are listed below for  reference.     Microbiology: Recent Results (from the past 240 hour(s))  Aerobic/Anaerobic Culture (surgical/deep wound)     Status: None (Preliminary result)   Collection Time: 02/21/17  9:49 AM  Result Value Ref Range Status   Specimen Description ABSCESS LEFT PLEURAL  Final   Special Requests Normal  Final   Gram Stain   Final    FEW WBC PRESENT, PREDOMINANTLY PMN NO ORGANISMS SEEN    Culture   Final    NO GROWTH 3 DAYS NO ANAEROBES ISOLATED; CULTURE IN PROGRESS FOR 5 DAYS Performed at [facility], [address] [place], [place] [address]    Report Status PENDING  Incomplete     Labs: BNP (last 3 results) Recent Labs    12/04/16 0030  BNP 1,027.4*   Basic Metabolic Panel: Recent Labs  Lab 02/22/17 0500 02/23/17 0428 02/24/17 0748 02/25/17 0502 02/26/17 0513  NA 137 137 136 138 136  K 3.6 3.9 3.9 3.8 3.8  CL 99* 101 101 102 102  CO2 27 26 24 24 22   GLUCOSE 103* 124* 125* 119* 150*  BUN 17 19 20  27* 32*  CREATININE 0.55* 0.55* 0.68 0.81 0.89  CALCIUM 7.8* 7.8* 7.9* 8.0* 7.8*   Liver Function Tests: No results for input(s): AST, ALT, ALKPHOS, BILITOT, PROT, ALBUMIN in the last 168 hours. No results for input(s): LIPASE, AMYLASE in the last 168 hours. No results for input(s):  AMMONIA in the last 168 hours. CBC: Recent Labs  Lab 02/21/17 0300 02/22/17 1044 02/23/17 0428 02/24/17 0748 02/25/17 1100  WBC 29.4* 20.7* 22.0* 20.9* 21.7*  HGB 12.9* 11.9* 11.8* 12.5* 12.1*  HCT 37.6* 35.5* 34.8* 36.8* 36.7*  MCV 92.2 92.7 92.3 92.7 94.3  PLT 320 291 322 314 353   Cardiac Enzymes: No results for input(s): CKTOTAL, CKMB, CKMBINDEX, TROPONINI in the last 168 hours. BNP: Invalid input(s): POCBNP CBG: Recent Labs  Lab 02/20/17 0837 02/25/17 0806  GLUCAP 103* 109*   D-Dimer No results for input(s): DDIMER in the last 72 hours. Hgb A1c No results for input(s): HGBA1C in the last 72 hours. Lipid Profile No results for input(s): CHOL, HDL, LDLCALC, TRIG, CHOLHDL, LDLDIRECT in the last 72 hours. Thyroid function studies Recent Labs    02/24/17 0748  TSH 3.491   Anemia work up Recent Labs    02/24/17 0748  VITAMINB12 1,458*   Urinalysis    Component Value Date/Time   COLORURINE YELLOW 02/19/2017 1145   APPEARANCEUR CLEAR 02/19/2017 1145   LABSPEC 1.012 02/19/2017 1145   PHURINE 7.0 02/19/2017 1145   GLUCOSEU NEGATIVE 02/19/2017 1145   HGBUR NEGATIVE 02/19/2017 1145   BILIRUBINUR NEGATIVE 02/19/2017 1145   KETONESUR NEGATIVE 02/19/2017 1145   PROTEINUR NEGATIVE 02/19/2017 1145   NITRITE NEGATIVE 02/19/2017 1145   LEUKOCYTESUR NEGATIVE 02/19/2017 1145   Sepsis Labs Invalid input(s): PROCALCITONIN,  WBC,  LACTICIDVEN Microbiology Recent Results (from the past 240 hour(s))  Aerobic/Anaerobic Culture (surgical/deep wound)     Status: None (Preliminary result)   Collection Time: 02/21/17  9:49 AM  Result Value Ref Range Status   Specimen Description ABSCESS LEFT PLEURAL  Final   Special Requests Normal  Final   Gram Stain   Final    FEW WBC PRESENT, PREDOMINANTLY PMN NO ORGANISMS SEEN    Culture   Final    NO GROWTH 3 DAYS NO ANAEROBES ISOLATED; CULTURE IN PROGRESS FOR 5 DAYS Performed at [facility], [address]  [place], [place] [address]  Report Status PENDING  Incomplete     Time coordinating discharge: Over 30 minutes  SIGNED:   Alba Cory, MD  [facility] 02/26/2017, 8:31 AM Pager   If 7PM-7AM, please contact night-coverage [email or web address] Password [email or web address]

## 2017-02-26 NOTE — Progress Notes (Signed)
SNF called asking when patient will be leaving. [facility] called about pick up- dispatcher stated he was next on the list.   [id number] SNF called back asking about when pt will be leaving, RN at SNF made aware that [facility] was called and pt was next on the list.

## 2017-03-03 ENCOUNTER — Ambulatory Visit: Payer: BLUE CROSS/BLUE SHIELD | Admitting: Cardiovascular Disease

## 2017-03-04 ENCOUNTER — Telehealth: Payer: Self-pay | Admitting: *Deleted

## 2017-03-04 NOTE — Telephone Encounter (Signed)
-----   Message from Ellsworth Lennox, [place] sent at 03/03/2017  4:45 PM EST ----- Event monitor showed NSR with isolated PVC's. He was actually admitted to the hospital during most of the monitoring period and therefore the monitor was removed. Diagnosed with atrial fibrillation during that time. Would continue on current medication regimen and keep close follow-up with Dr. Shirlee Latch on 03/05/2017.

## 2017-03-04 NOTE — Telephone Encounter (Signed)
-----   Message from Brittany M Strader, PA-C sent at 03/03/2017  4:45 PM EST ----- Event monitor showed NSR with isolated PVC's. He was actually admitted to the hospital during most of the monitoring period and therefore the monitor was removed. Diagnosed with atrial fibrillation during that time. Would continue on current medication regimen and keep close follow-up with Dr. McLean on 03/05/2017. 

## 2017-03-04 NOTE — Telephone Encounter (Signed)
Called pt to report test results. Pt. Answered phone, identified self as pt and stated that " I can't talk right now". Pt then ended call.

## 2017-03-04 NOTE — Telephone Encounter (Signed)
Called patient with test results. No answer. Unable to leave message to call back.  

## 2017-03-05 ENCOUNTER — Encounter (HOSPITAL_COMMUNITY): Payer: BLUE CROSS/BLUE SHIELD | Admitting: Cardiology

## 2017-03-09 ENCOUNTER — Inpatient Hospital Stay (HOSPITAL_COMMUNITY)
Admission: AD | Admit: 2017-03-09 | Discharge: 2017-03-21 | DRG: 175 | Disposition: E | Payer: BLUE CROSS/BLUE SHIELD | Source: Other Acute Inpatient Hospital | Attending: Internal Medicine | Admitting: Internal Medicine

## 2017-03-09 ENCOUNTER — Inpatient Hospital Stay (HOSPITAL_COMMUNITY): Payer: BLUE CROSS/BLUE SHIELD

## 2017-03-09 ENCOUNTER — Encounter (HOSPITAL_COMMUNITY): Payer: Self-pay | Admitting: *Deleted

## 2017-03-09 DIAGNOSIS — Z8719 Personal history of other diseases of the digestive system: Secondary | ICD-10-CM | POA: Diagnosis not present

## 2017-03-09 DIAGNOSIS — I480 Paroxysmal atrial fibrillation: Secondary | ICD-10-CM | POA: Diagnosis not present

## 2017-03-09 DIAGNOSIS — R042 Hemoptysis: Secondary | ICD-10-CM | POA: Diagnosis not present

## 2017-03-09 DIAGNOSIS — Z66 Do not resuscitate: Secondary | ICD-10-CM | POA: Diagnosis not present

## 2017-03-09 DIAGNOSIS — Z7951 Long term (current) use of inhaled steroids: Secondary | ICD-10-CM

## 2017-03-09 DIAGNOSIS — J984 Other disorders of lung: Secondary | ICD-10-CM | POA: Diagnosis present

## 2017-03-09 DIAGNOSIS — I2609 Other pulmonary embolism with acute cor pulmonale: Secondary | ICD-10-CM | POA: Diagnosis not present

## 2017-03-09 DIAGNOSIS — I493 Ventricular premature depolarization: Secondary | ICD-10-CM | POA: Diagnosis present

## 2017-03-09 DIAGNOSIS — I2699 Other pulmonary embolism without acute cor pulmonale: Principal | ICD-10-CM | POA: Diagnosis present

## 2017-03-09 DIAGNOSIS — R1310 Dysphagia, unspecified: Secondary | ICD-10-CM

## 2017-03-09 DIAGNOSIS — I5023 Acute on chronic systolic (congestive) heart failure: Secondary | ICD-10-CM | POA: Diagnosis present

## 2017-03-09 DIAGNOSIS — I5043 Acute on chronic combined systolic (congestive) and diastolic (congestive) heart failure: Secondary | ICD-10-CM | POA: Diagnosis not present

## 2017-03-09 DIAGNOSIS — I420 Dilated cardiomyopathy: Secondary | ICD-10-CM | POA: Diagnosis not present

## 2017-03-09 DIAGNOSIS — I2510 Atherosclerotic heart disease of native coronary artery without angina pectoris: Secondary | ICD-10-CM | POA: Diagnosis present

## 2017-03-09 DIAGNOSIS — I248 Other forms of acute ischemic heart disease: Secondary | ICD-10-CM | POA: Diagnosis not present

## 2017-03-09 DIAGNOSIS — I447 Left bundle-branch block, unspecified: Secondary | ICD-10-CM | POA: Diagnosis present

## 2017-03-09 DIAGNOSIS — K5790 Diverticulosis of intestine, part unspecified, without perforation or abscess without bleeding: Secondary | ICD-10-CM | POA: Diagnosis present

## 2017-03-09 DIAGNOSIS — I2601 Septic pulmonary embolism with acute cor pulmonale: Secondary | ICD-10-CM | POA: Diagnosis not present

## 2017-03-09 DIAGNOSIS — G92 Toxic encephalopathy: Secondary | ICD-10-CM | POA: Diagnosis not present

## 2017-03-09 DIAGNOSIS — I255 Ischemic cardiomyopathy: Secondary | ICD-10-CM | POA: Diagnosis not present

## 2017-03-09 DIAGNOSIS — I351 Nonrheumatic aortic (valve) insufficiency: Secondary | ICD-10-CM

## 2017-03-09 DIAGNOSIS — I272 Pulmonary hypertension, unspecified: Secondary | ICD-10-CM | POA: Diagnosis present

## 2017-03-09 DIAGNOSIS — I110 Hypertensive heart disease with heart failure: Secondary | ICD-10-CM | POA: Diagnosis present

## 2017-03-09 DIAGNOSIS — E876 Hypokalemia: Secondary | ICD-10-CM | POA: Diagnosis present

## 2017-03-09 DIAGNOSIS — I5042 Chronic combined systolic (congestive) and diastolic (congestive) heart failure: Secondary | ICD-10-CM | POA: Diagnosis present

## 2017-03-09 DIAGNOSIS — G4733 Obstructive sleep apnea (adult) (pediatric): Secondary | ICD-10-CM | POA: Diagnosis present

## 2017-03-09 DIAGNOSIS — I361 Nonrheumatic tricuspid (valve) insufficiency: Secondary | ICD-10-CM

## 2017-03-09 DIAGNOSIS — I739 Peripheral vascular disease, unspecified: Secondary | ICD-10-CM | POA: Diagnosis present

## 2017-03-09 DIAGNOSIS — Z86718 Personal history of other venous thrombosis and embolism: Secondary | ICD-10-CM

## 2017-03-09 DIAGNOSIS — F1010 Alcohol abuse, uncomplicated: Secondary | ICD-10-CM | POA: Diagnosis present

## 2017-03-09 DIAGNOSIS — I482 Chronic atrial fibrillation: Secondary | ICD-10-CM | POA: Diagnosis present

## 2017-03-09 DIAGNOSIS — Z79899 Other long term (current) drug therapy: Secondary | ICD-10-CM

## 2017-03-09 DIAGNOSIS — I481 Persistent atrial fibrillation: Secondary | ICD-10-CM | POA: Diagnosis present

## 2017-03-09 DIAGNOSIS — Z8249 Family history of ischemic heart disease and other diseases of the circulatory system: Secondary | ICD-10-CM

## 2017-03-09 DIAGNOSIS — J9601 Acute respiratory failure with hypoxia: Secondary | ICD-10-CM | POA: Diagnosis not present

## 2017-03-09 DIAGNOSIS — J45909 Unspecified asthma, uncomplicated: Secondary | ICD-10-CM | POA: Diagnosis present

## 2017-03-09 DIAGNOSIS — I340 Nonrheumatic mitral (valve) insufficiency: Secondary | ICD-10-CM | POA: Diagnosis present

## 2017-03-09 DIAGNOSIS — Z7901 Long term (current) use of anticoagulants: Secondary | ICD-10-CM

## 2017-03-09 DIAGNOSIS — R0602 Shortness of breath: Secondary | ICD-10-CM

## 2017-03-09 LAB — TROPONIN I
Troponin I: 0.09 ng/mL (ref <0.03)
Troponin I: 0.07 ng/mL (ref <0.03)

## 2017-03-09 LAB — HEMOGLOBIN AND HEMATOCRIT, BLOOD
HCT: 40 % (ref 39.0–52.0)
HEMATOCRIT: 36.7 % — AB (ref 39.0–52.0)
HEMATOCRIT: 39.7 % (ref 39.0–52.0)
HEMOGLOBIN: 12.9 g/dL — AB (ref 13.0–17.0)
Hemoglobin: 11.9 g/dL — ABNORMAL LOW (ref 13.0–17.0)
Hemoglobin: 12.5 g/dL — ABNORMAL LOW (ref 13.0–17.0)

## 2017-03-09 LAB — COMPREHENSIVE METABOLIC PANEL
ALBUMIN: 2.1 g/dL — AB (ref 3.5–5.0)
ALK PHOS: 77 U/L (ref 38–126)
ALT: 32 U/L (ref 17–63)
AST: 41 U/L (ref 15–41)
Anion gap: 8 (ref 5–15)
BUN: 12 mg/dL (ref 6–20)
CALCIUM: 7.8 mg/dL — AB (ref 8.9–10.3)
CO2: 29 mmol/L (ref 22–32)
CREATININE: 0.67 mg/dL (ref 0.61–1.24)
Chloride: 107 mmol/L (ref 101–111)
GFR calc Af Amer: >60 mL/min (ref >60)
GFR calc non Af Amer: >60 mL/min (ref >60)
GLUCOSE: 119 mg/dL — AB (ref 65–99)
Potassium: 2.7 mmol/L — CL (ref 3.5–5.1)
SODIUM: 144 mmol/L (ref 135–145)
Total Bilirubin: 1.4 mg/dL — ABNORMAL HIGH (ref 0.3–1.2)
Total Protein: 6.5 g/dL (ref 6.5–8.1)

## 2017-03-09 LAB — CBC WITH DIFFERENTIAL/PLATELET
BASOS PCT: 1 %
Basophils Absolute: 0.1 10*3/uL (ref 0.0–0.1)
EOS ABS: 0.1 10*3/uL (ref 0.0–0.7)
Eosinophils Relative: 1 %
HCT: 36.6 % — ABNORMAL LOW (ref 39.0–52.0)
HEMOGLOBIN: 11.9 g/dL — AB (ref 13.0–17.0)
LYMPHS ABS: 2.7 10*3/uL (ref 0.7–4.0)
Lymphocytes Relative: 20 %
MCH: 30.5 pg (ref 26.0–34.0)
MCHC: 32.5 g/dL (ref 30.0–36.0)
MCV: 93.8 fL (ref 78.0–100.0)
Monocytes Absolute: 1 10*3/uL (ref 0.1–1.0)
Monocytes Relative: 8 %
Neutro Abs: 9.6 10*3/uL — ABNORMAL HIGH (ref 1.7–7.7)
Neutrophils Relative %: 70 %
Platelets: 227 10*3/uL (ref 150–400)
RBC: 3.9 MIL/uL — AB (ref 4.22–5.81)
RDW: 16.2 % — ABNORMAL HIGH (ref 11.5–15.5)
WBC: 13.5 10*3/uL — AB (ref 4.0–10.5)

## 2017-03-09 LAB — BLOOD GAS, ARTERIAL
Acid-Base Excess: 6.7 mmol/L — ABNORMAL HIGH (ref 0.0–2.0)
BICARBONATE: 31.2 mmol/L — AB (ref 20.0–28.0)
DRAWN BY: 295031
O2 Content: 3 L/min
O2 Saturation: 98.7 %
PATIENT TEMPERATURE: 37
pCO2 arterial: 45.5 mmHg (ref 32.0–48.0)
pH, Arterial: 7.451 — ABNORMAL HIGH (ref 7.350–7.450)
pO2, Arterial: 129 mmHg — ABNORMAL HIGH (ref 83.0–108.0)

## 2017-03-09 LAB — BRAIN NATRIURETIC PEPTIDE: B Natriuretic Peptide: 4085.6 pg/mL — ABNORMAL HIGH (ref 0.0–100.0)

## 2017-03-09 LAB — MAGNESIUM: Magnesium: 1.7 mg/dL (ref 1.7–2.4)

## 2017-03-09 LAB — TSH: TSH: 2.8 u[IU]/mL (ref 0.350–4.500)

## 2017-03-09 LAB — PROTIME-INR
INR: 1.7
PROTHROMBIN TIME: 19.9 s — AB (ref 11.4–15.2)

## 2017-03-09 LAB — HEPARIN LEVEL (UNFRACTIONATED)
HEPARIN UNFRACTIONATED: 0.36 [IU]/mL (ref 0.30–0.70)
HEPARIN UNFRACTIONATED: 0.28 [IU]/mL — AB (ref 0.30–0.70)

## 2017-03-09 LAB — MRSA PCR SCREENING: MRSA by PCR: POSITIVE — AB

## 2017-03-09 LAB — APTT: APTT: 69 s — AB (ref 24–36)

## 2017-03-09 MED ORDER — FUROSEMIDE 10 MG/ML IJ SOLN
20.0000 mg | Freq: Every day | INTRAMUSCULAR | Status: DC
  Administered 2017-03-10: 20 mg via INTRAVENOUS
  Filled 2017-03-09: qty 2

## 2017-03-09 MED ORDER — MUPIROCIN 2 % EX OINT
1.0000 "application " | TOPICAL_OINTMENT | Freq: Two times a day (BID) | CUTANEOUS | Status: DC
  Administered 2017-03-09 – 2017-03-10 (×3): 1 via NASAL
  Filled 2017-03-09 (×2): qty 22

## 2017-03-09 MED ORDER — ZOLPIDEM TARTRATE 5 MG PO TABS
2.5000 mg | ORAL_TABLET | Freq: Every evening | ORAL | Status: DC | PRN
  Administered 2017-03-09: 2.5 mg via ORAL
  Filled 2017-03-09: qty 1

## 2017-03-09 MED ORDER — ONDANSETRON HCL 4 MG/2ML IJ SOLN: 4.0000 mg | Freq: Four times a day (QID) | INTRAMUSCULAR | Status: DC | PRN

## 2017-03-09 MED ORDER — DIGOXIN 125 MCG PO TABS
0.1250 mg | ORAL_TABLET | Freq: Every day | ORAL | Status: DC
  Administered 2017-03-09: 0.125 mg via ORAL
  Filled 2017-03-09: qty 1

## 2017-03-09 MED ORDER — CHLORHEXIDINE GLUCONATE CLOTH 2 % EX PADS
6.0000 | MEDICATED_PAD | Freq: Every day | CUTANEOUS | Status: DC
  Administered 2017-03-09 – 2017-03-10 (×2): 6 via TOPICAL

## 2017-03-09 MED ORDER — METOPROLOL TARTRATE 5 MG/5ML IV SOLN
2.5000 mg | Freq: Four times a day (QID) | INTRAVENOUS | Status: DC
  Administered 2017-03-09 – 2017-03-10 (×3): 2.5 mg via INTRAVENOUS
  Filled 2017-03-09 (×3): qty 5

## 2017-03-09 MED ORDER — POLYETHYLENE GLYCOL 3350 17 G PO PACK
17.0000 g | PACK | Freq: Every day | ORAL | Status: DC
  Filled 2017-03-09: qty 1

## 2017-03-09 MED ORDER — ACETAMINOPHEN 325 MG PO TABS: 650.0000 mg | ORAL_TABLET | Freq: Four times a day (QID) | ORAL | Status: DC | PRN

## 2017-03-09 MED ORDER — FUROSEMIDE 40 MG PO TABS
40.0000 mg | ORAL_TABLET | Freq: Every day | ORAL | Status: DC
  Administered 2017-03-09: 40 mg via ORAL
  Filled 2017-03-09: qty 1

## 2017-03-09 MED ORDER — DIGOXIN 0.25 MG/ML IJ SOLN
0.1250 mg | Freq: Once | INTRAMUSCULAR | Status: AC
  Filled 2017-03-09: qty 0.5

## 2017-03-09 MED ORDER — ATORVASTATIN CALCIUM 40 MG PO TABS
40.0000 mg | ORAL_TABLET | Freq: Every day | ORAL | Status: DC
  Filled 2017-03-09: qty 1

## 2017-03-09 MED ORDER — SODIUM CHLORIDE 0.9 % IV BOLUS (SEPSIS)
500.0000 mL | Freq: Once | INTRAVENOUS | Status: AC
Start: 2017-03-09 — End: 2017-03-09
  Administered 2017-03-09: 500 mL via INTRAVENOUS

## 2017-03-09 MED ORDER — ALPRAZOLAM 0.5 MG PO TABS
0.5000 mg | ORAL_TABLET | Freq: Once | ORAL | Status: AC
  Administered 2017-03-09: 0.5 mg via ORAL
  Filled 2017-03-09: qty 1

## 2017-03-09 MED ORDER — VANCOMYCIN HCL IN DEXTROSE 1-5 GM/200ML-% IV SOLN
1000.0000 mg | Freq: Two times a day (BID) | INTRAVENOUS | Status: DC
  Administered 2017-03-09 – 2017-03-10 (×2): 1000 mg via INTRAVENOUS
  Filled 2017-03-09 (×2): qty 200

## 2017-03-09 MED ORDER — IPRATROPIUM-ALBUTEROL 0.5-2.5 (3) MG/3ML IN SOLN: 3.0000 mL | RESPIRATORY_TRACT | Status: DC | PRN

## 2017-03-09 MED ORDER — HEPARIN (PORCINE) IN NACL 100-0.45 UNIT/ML-% IJ SOLN
1000.0000 [IU]/h | INTRAMUSCULAR | Status: DC
  Administered 2017-03-10: 1000 [IU]/h via INTRAVENOUS
  Filled 2017-03-09: qty 250

## 2017-03-09 MED ORDER — VANCOMYCIN HCL 10 G IV SOLR
1500.0000 mg | Freq: Once | INTRAVENOUS | Status: AC
  Administered 2017-03-09: 1500 mg via INTRAVENOUS
  Filled 2017-03-09: qty 1500

## 2017-03-09 MED ORDER — PIPERACILLIN-TAZOBACTAM 3.375 G IVPB
3.3750 g | Freq: Three times a day (TID) | INTRAVENOUS | Status: DC
  Administered 2017-03-09 – 2017-03-10 (×4): 3.375 g via INTRAVENOUS
  Filled 2017-03-09 (×4): qty 50

## 2017-03-09 MED ORDER — POTASSIUM CHLORIDE 10 MEQ/100ML IV SOLN
10.0000 meq | INTRAVENOUS | Status: AC
  Administered 2017-03-09 (×6): 10 meq via INTRAVENOUS
  Filled 2017-03-09 (×6): qty 100

## 2017-03-09 MED ORDER — AMIODARONE HCL 200 MG PO TABS
200.0000 mg | ORAL_TABLET | Freq: Every day | ORAL | Status: DC
  Administered 2017-03-09: 200 mg via ORAL
  Filled 2017-03-09: qty 1

## 2017-03-09 MED ORDER — POLYETHYLENE GLYCOL 3350 17 GM/SCOOP PO POWD: 17.0000 g | Freq: Every day | ORAL | Status: DC

## 2017-03-09 MED ORDER — MAGNESIUM SULFATE IN D5W 1-5 GM/100ML-% IV SOLN
1.0000 g | Freq: Once | INTRAVENOUS | Status: AC
  Administered 2017-03-09: 1 g via INTRAVENOUS
  Filled 2017-03-09: qty 100

## 2017-03-09 MED ORDER — METOPROLOL TARTRATE 25 MG PO TABS
25.0000 mg | ORAL_TABLET | Freq: Two times a day (BID) | ORAL | Status: DC
  Administered 2017-03-09: 25 mg via ORAL
  Filled 2017-03-09: qty 1

## 2017-03-09 MED ORDER — POTASSIUM CL IN DEXTROSE 5% 20 MEQ/L IV SOLN
20.0000 meq | INTRAVENOUS | Status: DC
  Administered 2017-03-09 – 2017-03-10 (×2): 20 meq via INTRAVENOUS
  Filled 2017-03-09 (×2): qty 1000

## 2017-03-09 MED ORDER — ACETAMINOPHEN 650 MG RE SUPP: 650.0000 mg | Freq: Four times a day (QID) | RECTAL | Status: DC | PRN

## 2017-03-09 MED ORDER — POTASSIUM CHLORIDE CRYS ER 20 MEQ PO TBCR
40.0000 meq | EXTENDED_RELEASE_TABLET | Freq: Four times a day (QID) | ORAL | Status: DC
  Filled 2017-03-09: qty 2

## 2017-03-09 MED ORDER — SODIUM CHLORIDE 0.9 % IJ SOLN
INTRAMUSCULAR | Status: AC
  Administered 2017-03-09: 13:00:00
  Filled 2017-03-09: qty 50

## 2017-03-09 MED ORDER — IOPAMIDOL (ISOVUE-370) INJECTION 76%
INTRAVENOUS | Status: AC
  Administered 2017-03-09: 11:00:00
  Filled 2017-03-09: qty 100

## 2017-03-09 MED ORDER — SUCRALFATE 1 G PO TABS
1.0000 g | ORAL_TABLET | Freq: Three times a day (TID) | ORAL | Status: DC
  Administered 2017-03-10: 1 g via ORAL
  Filled 2017-03-09 (×2): qty 1

## 2017-03-09 MED ORDER — DILTIAZEM HCL-DEXTROSE 100-5 MG/100ML-% IV SOLN (PREMIX)
5.0000 mg/h | INTRAVENOUS | Status: DC
  Filled 2017-03-09: qty 100

## 2017-03-09 MED ORDER — ONDANSETRON HCL 4 MG PO TABS: 4.0000 mg | ORAL_TABLET | Freq: Four times a day (QID) | ORAL | Status: DC | PRN

## 2017-03-09 MED ORDER — MOMETASONE FURO-FORMOTEROL FUM 200-5 MCG/ACT IN AERO
2.0000 | INHALATION_SPRAY | Freq: Two times a day (BID) | RESPIRATORY_TRACT | Status: DC
  Administered 2017-03-09 (×2): 2 via RESPIRATORY_TRACT
  Filled 2017-03-09: qty 8.8

## 2017-03-09 MED ORDER — ENSURE ENLIVE PO LIQD
237.0000 mL | Freq: Four times a day (QID) | ORAL | Status: DC
  Administered 2017-03-09: 237 mL via ORAL

## 2017-03-09 NOTE — Progress Notes (Signed)
Brief Pharmacy Note re: heparin  O: Heparin level = 0.28 on 1000 units/hr       Hg stable      RN reported pt continues to have blood tinged sputum.    A/P:  Discussed pt with RN.  She had discussed bleeding with MD.             Will continue heparin at current rate for now (despite slightly below goal range) & monitor bleeding.            F/U Heparin level & CBC in am.    Junita Push, PharmD, BCPS 03/14/2017@7 :37 PM

## 2017-03-09 NOTE — Evaluation (Signed)
Clinical/Bedside Swallow Evaluation Patient Details  Name: Stephen Fleming MRN: [id number] Date of Birth: Feb 06, 1955  Today's Date: Mar 26, 2017 Time: SLP Start Time (ACUTE ONLY): 1545 SLP Stop Time (ACUTE ONLY): 1640 SLP Time Calculation (min) (ACUTE ONLY): 55 min  Past Medical History:  Past Medical History:  Diagnosis Date  . Arthritis    "probably in all my joints" (12/03/2016)  . Asthma   . CAD (coronary artery disease)    a. LHC 12/04/16: 30% RCA, 50% RPDA, 25% mid LAD, 95% 1st diag, 70% 2nd diag.  . CHF (congestive heart failure), NYHA class II, chronic, combined (HCC)    12/04/16: Echo EF 20-25%, Gr 3 DD, PA peak pressure 62 mmgHg, RHC: PCPW 29 mmHg  . Eczema   . Gout    "I take RX for it" (12/03/2016)  . Heart murmur   . Hypertension   . Mitral regurgitation    12/04/16: moderate by echo  . OSA (obstructive sleep apnea)    "never could tolerate the mask" (12/03/2016)  . Pulmonary hypertension (HCC)    12/04/16: RHC: Mod PHTN, PAP 37 mmHg   Past Surgical History:  Past Surgical History:  Procedure Laterality Date  . APPENDECTOMY  ~  1967  . BACK SURGERY    . COLONOSCOPY  ~ 2008  . ESOPHAGOGASTRODUODENOSCOPY  ~ 2008  . INGUINAL HERNIA REPAIR Right ~ 1976  . LUMBAR DISC SURGERY  1991; 1993   "[name] did them both"  . RIGHT/LEFT HEART CATH AND CORONARY ANGIOGRAPHY N/A 12/04/2016   Procedure: RIGHT/LEFT HEART CATH AND CORONARY ANGIOGRAPHY;  Surgeon: [place]az[place], Peter M, MD;  Location: [facility] INVASIVE CV LAB;  Service: Cardiovascular;  Laterality: N/A;   HPI:  62 year old with complicated history, severe cardiomyopathy, recent PE, leftchest tube placed early February for loculated effusion by IR, DVT on anticoagulation. Reportedly had GI bleed, hemoptysis, bleeding from Foley at [facility] > stable now.CT scan of the abdomen in the emergency room showed signs of pyelonephritis and signs of a developing pneumonia in the right middle and lower lobes suspected to be due  to aspiration as patient has had issues with dysphagia during his previous hospital admissions, per MD notes. No notes pertaining to dysphagia found in review of pt's records in care everywhere; has not been seen by our services during prior admissions. Pt reports he has had trouble taking pills.   Assessment / Plan / Recommendation Clinical Impression  Patient presents with respiratory-based dysphagia. No overt signs of aspiration with 3 oz water swallow challenge or when self feeding pureed solids, regular crackers. Airway protection appears adequate when respiratory rate remains below 30. Pt's respiratory rate, however, is fluctuating throughout duration of session but does not appear associated with PO intake. Pt/family requested he eat a sub sandwich; SLP provided skilled observation for differential diagnosis. Initially airway protection appeared adequate, however pt impulsive, taking large bites. With a fluctuation in respiratory rate up to 37, pt had an immediate coughing and near choking event due to respiratory/swallow discoordination. SLP provided education re: increased risk for aspiration in the setting of pt's current respiratory status. Provided written and verbal education re: recommendations for dysphagia 1, thin liquids, small bites and sips, slow rate of intake. Full supervision recommended to closely monitor pt's respiratory status; RN, wife informed pt not to consume PO unless respiratory rate stable below 30. Will follow. SLP Visit Diagnosis: Dysphagia, pharyngeal phase (R13.13)    Aspiration Risk  Moderate aspiration risk    Diet Recommendation Dysphagia 1 (Puree);Thin liquid  Liquid Administration via: Cup;Straw Medication Administration: Crushed with puree Supervision: Patient able to self feed;Full supervision/cueing for compensatory strategies Compensations: Slow rate;Small sips/bites;Minimize environmental distractions Postural Changes: Seated upright at 90 degrees     Other  Recommendations Oral Care Recommendations: Oral care BID   Follow up Recommendations Other (comment)(TBD)      Frequency and Duration min 2x/week  2 weeks       Prognosis Prognosis for Safe Diet Advancement: Fair Barriers to Reach Goals: Other (Comment)(comorbidities)      Swallow Study   General Date of Onset: 02/23/2017 HPI: 62 year old with complicated history, severe cardiomyopathy, recent PE, leftchest tube placed early February for loculated effusion by IR, DVT on anticoagulation. Reportedly had GI bleed, hemoptysis, bleeding from Foley at [facility] > stable now.CT scan of the abdomen in the emergency room showed signs of pyelonephritis and signs of a developing pneumonia in the right middle and lower lobes suspected to be due to aspiration as patient has had issues with dysphagia during his previous hospital admissions, per MD notes. No notes pertaining to dysphagia found in review of pt's records in care everywhere; has not been seen by our services during prior admissions. Pt reports he has had trouble taking pills. Type of Study: Bedside Swallow Evaluation Previous Swallow Assessment: none on file Diet Prior to this Study: NPO Temperature Spikes Noted: No Respiratory Status: Nasal cannula History of Recent Intubation: No Behavior/Cognition: Alert;Cooperative Oral Cavity Assessment: Within Functional Limits Oral Care Completed by SLP: Recent completion by staff Oral Cavity - Dentition: Adequate natural dentition Vision: Functional for self-feeding Self-Feeding Abilities: Able to feed self Patient Positioning: Upright in bed Baseline Vocal Quality: Low vocal intensity Volitional Cough: Weak Volitional Swallow: Able to elicit    Oral/Motor/Sensory Function Overall Oral Motor/Sensory Function: Within functional limits   Ice Chips Ice chips: Within functional limits Presentation: Spoon   Thin Liquid Thin Liquid: Within functional limits Presentation:  Cup;Straw;Self Fed    Nectar Thick Nectar Thick Liquid: Not tested   Honey Thick Honey Thick Liquid: Not tested   Puree Puree: Within functional limits Presentation: Spoon;Self Fed   Solid   GO   Solid: Impaired Presentation: Self Fed Pharyngeal Phase Impairments: Cough - Immediate;Change in Vital Signs       Rondel Baton, MS, CCC-SLP Speech-Language Pathologist [phone]  Arlana Lindau 03/08/2017,4:59 PM

## 2017-03-09 NOTE — Progress Notes (Signed)
  Echocardiogram 2D Echocardiogram has been performed.  Rina Adney T Eleanor Gatliff 03/07/2017, 8:41 AM

## 2017-03-09 NOTE — Consult Note (Signed)
PULMONARY / CRITICAL CARE MEDICINE   Name: Stephen Fleming MRN: [id number] DOB: 1955-11-09    ADMISSION DATE:  03/20/2017 CONSULTATION DATE:  03/18/2017  REFERRING MD: Susa Raring MD  CHIEF COMPLAINT: Pulmonary embolism,   HISTORY OF PRESENT ILLNESS:   63 year old with history of hypertension, severe coronary artery disease, heart failure [EF 10-15%], paroxysmal atrial fibrillation.  He had a recent admission at [facility] and [facility] for bilateral DVT, submassive PE.  EKOS were considered but not administered as he was stable He developed left lung consolidation thought to be secondary to infarction versus pneumonia, treated with broad-spectrum antibiotics, left chest tube placed by IR on 2/1- 2/4  Admitted to [facility] on 2/11 with melanotic stools (100- 200cc). His hemoglobin did not drop.  EGD, colonoscopy did not show any acute lesion.  He has been restarted on anticoagulation and sent to [facility] for further evaluation.  PAST MEDICAL HISTORY :  He  has a past medical history of Arthritis, Asthma, CAD (coronary artery disease), CHF (congestive heart failure), NYHA class II, chronic, combined (HCC), Eczema, Gout, Heart murmur, Hypertension, Mitral regurgitation, OSA (obstructive sleep apnea), and Pulmonary hypertension (HCC).  PAST SURGICAL HISTORY: He  has a past surgical history that includes Back surgery; Appendectomy (~  1967); Inguinal hernia repair (Right, ~ 1976); Lumbar disc surgery (1991; 1993); Colonoscopy (~ 2008); Esophagogastroduodenoscopy (~ 2008); and RIGHT/LEFT HEART CATH AND CORONARY ANGIOGRAPHY (N/A, 12/04/2016).  No Known Allergies  No current facility-administered medications on file prior to encounter.    Current Outpatient Medications on File Prior to Encounter  Medication Sig  . albuterol (PROVENTIL HFA;VENTOLIN HFA) 108 (90 BASE) MCG/ACT inhaler Inhale 2 puffs into the lungs every 6 (six) hours as needed for wheezing.  Marland Kitchen amiodarone (PACERONE) 200 MG tablet  Take 1 tablet (200 mg total) by mouth daily.  Marland Kitchen ampicillin-sulbactam (UNASYN) IVPB Inject 3 g into the vein every 6 (six) hours.  Marland Kitchen apixaban (ELIQUIS) 5 MG TABS tablet Take 1 tablet (5 mg total) by mouth 2 (two) times daily.  Marland Kitchen atorvastatin (LIPITOR) 40 MG tablet Take 1 tablet (40 mg total) by mouth daily at 6 PM.  . carvedilol (COREG) 3.125 MG tablet Take 1 tablet (3.125 mg total) by mouth 2 (two) times daily with a meal.  . digoxin (LANOXIN) 0.125 MG tablet Take 1 tablet (0.125 mg total) by mouth daily.  . feeding supplement, ENSURE ENLIVE, (ENSURE ENLIVE) LIQD Take 237 mLs by mouth 4 (four) times daily.  . Fluticasone-Salmeterol (ADVAIR) 250-50 MCG/DOSE AEPB Inhale 1 puff into the lungs every 12 (twelve) hours.  . folic acid (FOLVITE) 1 MG tablet Take 1 tablet (1 mg total) by mouth daily.  . furosemide (LASIX) 40 MG tablet Take 1 tablet (40 mg total) by mouth daily.  . hydrocortisone (ANUSOL-HC) 25 MG suppository Place 1 suppository (25 mg total) rectally 2 (two) times daily as needed for hemorrhoids or anal itching.  . losartan (COZAAR) 25 MG tablet Take 0.5 tablets (12.5 mg total) by mouth 2 (two) times daily.  . metoprolol succinate (TOPROL-XL) 25 MG 24 hr tablet Take 25 mg by mouth daily.  Marland Kitchen morphine 10 MG/5ML solution Take 1 mg by mouth every 6 (six) hours as needed for severe pain.  . Multiple Vitamin (MULTIVITAMIN WITH MINERALS) TABS tablet Take 1 tablet by mouth daily.  . pantoprazole (PROTONIX) 40 MG tablet Take 40 mg by mouth daily.  . polyethylene glycol powder (GLYCOLAX/MIRALAX) powder Take 17 g daily by mouth.  . potassium chloride SA (  K-DUR,KLOR-CON) 20 MEQ tablet Take 20 mEq by mouth daily.  . QUEtiapine (SEROQUEL) 25 MG tablet Take 1 tablet (25 mg total) by mouth at bedtime.  Marland Kitchen spironolactone (ALDACTONE) 25 MG tablet Take 1 tablet (25 mg total) by mouth daily.  . sucralfate (CARAFATE) 1 g tablet Take 1 g by mouth 4 (four) times daily -  with meals and at bedtime.  . thiamine  100 MG tablet Take 1 tablet (100 mg total) by mouth daily.  . traMADol (ULTRAM) 50 MG tablet Take 0.5 tablets (25 mg total) by mouth every 12 (twelve) hours as needed for moderate pain or severe pain.  Marland Kitchen apixaban (ELIQUIS) 5 MG TABS tablet Take 2 tablets (10 mg total) by mouth 2 (two) times daily. (Patient not taking: Reported on 02/23/2017)  . Vitamin D, Ergocalciferol, (DRISDOL) 50000 units CAPS capsule Take 1 capsule (50,000 Units total) by mouth every 7 (seven) days. (Patient not taking: Reported on 02/28/2017)    FAMILY HISTORY:  His indicated that his mother is deceased. He indicated that his father is deceased. He indicated that his brother is deceased.   SOCIAL HISTORY: He  reports that  has never smoked. he has never used smokeless tobacco. He reports that he drinks about 12.0 oz of alcohol per week. He reports that he does not use drugs.  REVIEW OF SYSTEMS:   Unable to obtain  SUBJECTIVE:    VITAL SIGNS: BP 110/79   Pulse 95   Temp 97.7 F (36.5 C) (Axillary)   Resp (!) 21   SpO2 99%   HEMODYNAMICS:    VENTILATOR SETTINGS:    INTAKE / OUTPUT: I/O last 3 completed shifts: In: -  Out: 800 [Urine:800]  PHYSICAL EXAMINATION: Blood pressure 110/79, pulse 95, temperature 97.7 F (36.5 C), temperature source Axillary, resp. rate (!) 21, SpO2 100 %. Gen:      No acute distress, frail, catchetic HEENT:  EOMI, sclera anicteric Neck:     No masses; no thyromegaly Lungs:    Bilateral crackles, no wheeze CV:         Irregular Abd:      + bowel sounds; soft, non-tender; no palpable masses, no distension Ext:    2+ edema; adequate peripheral perfusion Skin:      Warm and dry; no rash Neuro: Somnolent, arousable  LABS:  BMET Recent Labs  Lab 03/06/2017 0640  NA 144  K 2.7*  CL 107  CO2 29  BUN 12  CREATININE 0.67  GLUCOSE 119*    Electrolytes Recent Labs  Lab 02/23/2017 0640  CALCIUM 7.8*  MG 1.7    CBC Recent Labs  Lab 02/21/2017 0640  WBC 13.5*  HGB  11.9*  HCT 36.6*  PLT 227    Coag's Recent Labs  Lab 03/15/2017 0640  APTT 69*  INR 1.70    Sepsis Markers No results for input(s): LATICACIDVEN, PROCALCITON, O2SATVEN in the last 168 hours.  ABG No results for input(s): PHART, PCO2ART, PO2ART in the last 168 hours.  Liver Enzymes Recent Labs  Lab 03/02/2017 0640  AST 41  ALT 32  ALKPHOS 77  BILITOT 1.4*  ALBUMIN 2.1*    Cardiac Enzymes Recent Labs  Lab 02/28/2017 0640  TROPONINI 0.09*    Glucose No results for input(s): GLUCAP in the last 168 hours.  Imaging CT angiogram 02/11/17-extensive bilateral emboli, consolidation of the left lower lobe.  CT scan 02/19/17-moderate to large loculated left effusion, dense airspace consolidation of the left lower lobe.  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  I have reviewed the images personally   STUDIES:    CULTURES:   ANTIBIOTICS:   SIGNIFICANT EVENTS:   LINES/TUBES:   DISCUSSION: 62 year old with complicated history, severe cardiomyopathy, recent PE, DVT on anticoagulation. Reportedly has GI bleed, hemoptysis, bleeding from the Foley at [facility] > stable now.    CT scan 2/16 reviewed which shows no new clot.  He has extensive consolidation with cavitation of the left lung with bronchopleural fistula.  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 History noted for left chest tube placed early February for loculated effusion by IR  Acute pulmonary embolism, DVT Currently on anticoagulation with heparin.  Low threshold to stop if he bleeds again Will need an IVC filter if he cannot be anticoagulated  Left lung consolidation,  cavitation, bronchopleural fistula Start Vanco, Zosyn Check cultures, pro calcitonin Not a candidate for any invasive intervention such as bronch due to severe comorbidities, cardiomyopathy. There is no indication for repeat chest tube at this point. Keep NPO, swallow eval for aspiration  Prognosis is extremely poor.  Consider palliative care consult  Discussed with Dr. Joseph Pierini MD [facility] Pager [phone] If no answer or after 3pm call: [phone] Mar 30, 2017, 10:28 AM

## 2017-03-09 NOTE — Progress Notes (Signed)
Pharmacy Antibiotic Note  Stephen Fleming is a 62 y.o. male admitted on 02/22/2017 with +PE known to pharmacy from heparin dosing.  Also concern for PNA.  Pharmacy has now been consulted for Vancomycin & Zosyn dosing. 02/27/2017:  Afebrile  Mild Leukocytosis  Renal function at baseline- est CrCl ~41ml/min  Plan: Zosyn 3.375g IV q8h (4 hour infusion).  Vancomycin 1500mg  IV x1 then Vanc 1gm IV q12h (goal AUC 400-500) Daily Scr Monitor renal function and cx data      Temp (24hrs), Avg:97.7 F (36.5 C), Min:97.7 F (36.5 C), Max:97.7 F (36.5 C)  Recent Labs  Lab 03/02/2017 0640  WBC 13.5*  CREATININE 0.67    Estimated Creatinine Clearance: 97.6 mL/min (by C-G formula based on SCr of 0.67 mg/dL).    No Known Allergies  Antimicrobials this admission: 2/17 Vanc >>  2/17 Zosyn >>   Dose adjustments this admission:  Microbiology results: 2/17 Sputum:   2/17 MRSA PCR: +  Thank you for allowing pharmacy to be a part of this patient's care.  Elson Clan 03/12/2017 10:14 AM

## 2017-03-09 NOTE — Progress Notes (Addendum)
ANTICOAGULATION CONSULT NOTE - Initial Consult  Pharmacy Consult for heparin Indication: PE/DVT  No Known Allergies  Patient Measurements:   Heparin Dosing Weight:   Vital Signs: Temp: 97.7 F (36.5 C) (02/17 0800) Temp Source: Axillary (02/17 0800) BP: 110/79 (02/17 0700) Pulse Rate: 95 (02/17 0700)  Labs: Recent Labs    Mar 28, 2017 0640 28-Mar-2017 0934 03/28/17 1153  HGB 11.9* 11.9*  --   HCT 36.6* 36.7*  --   PLT 227  --   --   APTT 69*  --   --   LABPROT 19.9*  --   --   INR 1.70  --   --   HEPARINUNFRC  --   --  0.36  CREATININE 0.67  --   --   TROPONINI 0.09* 0.07*  --     Estimated Creatinine Clearance: 97.6 mL/min (by C-G formula based on SCr of 0.67 mg/dL).   Medical History: Past Medical History:  Diagnosis Date  . Arthritis    "probably in all my joints" (12/03/2016)  . Asthma   . CAD (coronary artery disease)    a. LHC 12/04/16: 30% RCA, 50% RPDA, 25% mid LAD, 95% 1st diag, 70% 2nd diag.  . CHF (congestive heart failure), NYHA class II, chronic, combined (HCC)    12/04/16: Echo EF 20-25%, Gr 3 DD, PA peak pressure 62 mmgHg, RHC: PCPW 29 mmHg  . Eczema   . Gout    "I take RX for it" (12/03/2016)  . Heart murmur   . Hypertension   . Mitral regurgitation    12/04/16: moderate by echo  . OSA (obstructive sleep apnea)    "never could tolerate the mask" (12/03/2016)  . Pulmonary hypertension (HCC)    12/04/16: RHC: Mod PHTN, PAP 37 mmHg    Medications:  Infusions:  . dextrose 5 % with KCl 20 mEq / L    . heparin    . magnesium sulfate 1 - 4 g bolus IVPB    . piperacillin-tazobactam (ZOSYN)  IV 3.375 g (March 28, 2017 1128)  . potassium chloride 10 mEq (28-Mar-2017 1208)  . vancomycin 1,500 mg (03-28-17 1136)    Assessment: [name] admitted to [facility] on 2/11 with melanotic stools. Was on Eliquis for recently diagnosed PE/DVT. Received Kcentra there. EGD and colonoscopy did not show any acute lesions. While off anticoagulation he develeped new  pulmonary emboli and a caseating lung lesion was discovered. He was started on heparin and transferred to [facility] for pulmonary consult. Patient arrived on a heparin infusion at 1000units/hr.  Today, 28-Mar-2017: - Heparin level in the therapeutic range on 1000units/hr. - CBC this AM was ok. - RN reports patient coughed up some dark red mucous. Will continue to monitor.  Goal of Therapy:  Heparin level 0.3-0.7 units/ml Monitor platelets by anticoagulation protocol: Yes   Plan:  - Continue heparin at 1000units/hr - Check a confirmatory heparin level this evening - Heparin level and CBC daily - Follow for ability to resume oral anticoagulation  Charolotte Eke, PharmD. Mobile: [phone]. 03-28-17,12:56 PM.

## 2017-03-09 NOTE — Plan of Care (Addendum)
History and Physical    Stephen Fleming MRN:[id number] DOB: 07/29/1955 DOA: (Not on file)  Referring MD/NP/PA: Dr. Lawal Garba PCP: Tapper, David B., MD  Patient coming from: Transfer from [facility]  Chief Complaint: Coughing up blood  I have personally briefly reviewed patient's old medical records in [facility]   HPI: Stephen Fleming is a 62 y.o. 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    Patient had originally been seen at [place] on 1/21, with bilateral leg pain, hemoptysis, severe dyspnea on exertion, found to have bilateral LE DVT's and acute submassive PE on CTA with RV/LV of 1, right heart strain noted on TTE. He was placed on heparin, somewhat hypotensive and was transferred to [facility] for consideration of thrombolytics. No thrombolytics were indicated. Heart failure team was consulted for acute systolic LV and right heart failure.He experienced PAF with RVR since returning to [facility] on amiodarone.   On admission at [facility] on 2/11, patient was initially noted to be hypoxic with O2 saturations 85% on room air initially and blood pressures borderline hypotensive.  Examination revealed melanotic stools that were guaiac positive.  Hemoglobin at that time was noted to be 11.7.  Patient also had blood in his Foley catheter and a stage I decubitus ulcer noted.  Due to his symptoms it was felt the bleeding was related to the effects of anticoagulation in case syndrome was given as well as 1 unit of blood.  Repeat hemoglobin was noted to remain at 11 on admission.  CT scan of the abdomen in the emergency room  showed signs of pyelonephritis and signs of a developing pneumonia in the right middle and lower lobes suspected to be due to aspiration as patient has had issues with dysphasia during his previous hospital admissions.  During this period in time patient remains off anticoagulation and was given empiric antibiotics.  GI bleed symptoms resolved as well as hemoptysis.  He was evaluated by GI with colonoscopy and EGD on 2/14, and bleed symptoms and it was thought to likely be diverticular in nature.  However, patient was reported to have developed hemoptysis again on 2/16.  A CT scan was performed showing multiple lung lesions consistent with new pulmonary emboli and reported caseating lesion.  He also was noted to have gone into A. fib with RVR for which he was placed on a Cardizem drip with improvement of heart rates.  No reports of repeat GI bleed noted.  Transfer was accepted to stepdown bed at [place].  ED Course: as seen above   Review of Systems  Constitutional: Positive for malaise/fatigue. Negative for fever.  HENT: Negative for congestion and nosebleeds.   Eyes: Negative for pain and discharge.  Respiratory: Positive for cough, hemoptysis and shortness of breath.   Cardiovascular: Positive for leg swelling. Negative for chest pain.  Gastrointestinal: Negative for blood in stool, constipation, nausea and vomiting.  Genitourinary: Positive for hematuria. Negative for dysuria.  Musculoskeletal: Negative for falls.  Skin: Negative for rash.  Neurological: Positive for speech change and weakness. Negative for focal weakness and loss of consciousness.  Psychiatric/Behavioral: Negative for substance abuse.    Past Medical History:  Diagnosis   Date  . Arthritis    "probably in all my joints" (12/03/2016)  . Asthma   . CAD (coronary artery disease)    a. LHC 12/04/16: 30% RCA, 50% RPDA, 25% mid LAD, 95% 1st diag, 70% 2nd diag.  . CHF (congestive heart failure), NYHA class II, chronic,  combined (HCC)    12/04/16: Echo EF 20-25%, Gr 3 DD, PA peak pressure 62 mmgHg, RHC: PCPW 29 mmHg  . Eczema   . Gout    "I take RX for it" (12/03/2016)  . Heart murmur   . Hypertension   . Mitral regurgitation    12/04/16: moderate by echo  . OSA (obstructive sleep apnea)    "never could tolerate the mask" (12/03/2016)  . Pulmonary hypertension (HCC)    12/04/16: RHC: Mod PHTN, PAP 37 mmHg    Past Surgical History:  Procedure Laterality Date  . APPENDECTOMY  ~  1967  . BACK SURGERY    . COLONOSCOPY  ~ 2008  . ESOPHAGOGASTRODUODENOSCOPY  ~ 2008  . INGUINAL HERNIA REPAIR Right ~ 1976  . LUMBAR DISC SURGERY  1991; 1993   "Steven Robinson did them both"  . RIGHT/LEFT HEART CATH AND CORONARY ANGIOGRAPHY N/A 12/04/2016   Procedure: RIGHT/LEFT HEART CATH AND CORONARY ANGIOGRAPHY;  Surgeon: Jordan, Peter M, MD;  Location: [facility];  Service: Cardiovascular;  Laterality: N/A;     reports that  has never smoked. he has never used smokeless tobacco. He reports that he drinks about 12.0 oz of alcohol per week. He reports that he does not use drugs.  No Known Allergies  Family History  Problem Relation Age of Onset  . Heart attack Mother   . CAD Father   . Heart attack Brother     Prior to Admission medications   Medication Sig Start Date End Date Taking? Authorizing Provider  albuterol (PROVENTIL HFA;VENTOLIN HFA) 108 (90 BASE) MCG/ACT inhaler Inhale 2 puffs into the lungs every 6 (six) hours as needed for wheezing.    [provider]  amiodarone (PACERONE) 200 MG tablet Take 1 tablet (200 mg total) by mouth daily. 02/26/17   Regalado, Belkys A, MD  apixaban (ELIQUIS) 5 MG TABS tablet Take 2 tablets (10 mg total) by mouth 2 (two) times daily. 02/26/17   Regalado, Belkys A, MD  apixaban (ELIQUIS) 5 MG TABS tablet Take 1 tablet (5 mg total) by mouth 2 (two) times daily. 03/03/17   Regalado, Belkys A, MD  atorvastatin (LIPITOR) 40 MG tablet Take 1 tablet (40 mg total) by  mouth daily at 6 PM. 02/26/17   Regalado, Belkys A, MD  carvedilol (COREG) 3.125 MG tablet Take 1 tablet (3.125 mg total) by mouth 2 (two) times daily with a meal. 02/26/17   Regalado, Belkys A, MD  digoxin (LANOXIN) 0.125 MG tablet Take 1 tablet (0.125 mg total) by mouth daily. 02/26/17   Regalado, Belkys A, MD  feeding supplement, ENSURE ENLIVE, (ENSURE ENLIVE) LIQD Take 237 mLs by mouth 4 (four) times daily. 02/26/17   Regalado, Belkys A, MD  Fluticasone-Salmeterol (ADVAIR) 250-50 MCG/DOSE AEPB Inhale 1 puff into the lungs every 12 (twelve) hours.    [provider]  folic acid (FOLVITE) 1 MG tablet Take 1 tablet (1 mg total) by mouth daily. 02/26/17   Regalado, Belkys A, MD  furosemide (LASIX) 40 MG tablet Take 1 tablet (40 mg total) by mouth daily. 02/26/17   Regalado, Belkys A, MD  hydrocortisone (ANUSOL-HC) 25 MG suppository Place 1 suppository (25   mg total) rectally 2 (two) times daily as needed for hemorrhoids or anal itching. 02/26/17   Regalado, Belkys A, MD  losartan (COZAAR) 25 MG tablet Take 0.5 tablets (12.5 mg total) by mouth 2 (two) times daily. 02/26/17   Regalado, Belkys A, MD  Multiple Vitamin (MULTIVITAMIN WITH MINERALS) TABS tablet Take 1 tablet by mouth daily. 02/26/17   Regalado, Belkys A, MD  polyethylene glycol powder (GLYCOLAX/MIRALAX) powder Take 17 g daily by mouth. 08/31/16   [provider]  QUEtiapine (SEROQUEL) 25 MG tablet Take 1 tablet (25 mg total) by mouth at bedtime. 02/26/17   Regalado, Belkys A, MD  spironolactone (ALDACTONE) 25 MG tablet Take 1 tablet (25 mg total) by mouth daily. 02/26/17   Regalado, Belkys A, MD  thiamine 100 MG tablet Take 1 tablet (100 mg total) by mouth daily. 02/26/17   Regalado, Belkys A, MD  traMADol (ULTRAM) 50 MG tablet Take 0.5 tablets (25 mg total) by mouth every 12 (twelve) hours as needed for moderate pain or severe pain. 02/26/17   Regalado, Belkys A, MD  Vitamin D, Ergocalciferol, (DRISDOL) 50000 units CAPS capsule Take 1 capsule (50,000  Units total) by mouth every 7 (seven) days. 03/04/17   Regalado, Belkys A, MD    Physical Exam:  Constitutional: Male that appears older than stated age with mouth open, but speech is slurred Vitals:   03/11/2017 0500 03/19/2017 0600 03/01/2017 0700  BP: 94/71 97/69 110/79  Pulse: (!) 46 (!) 47 95  Resp: (!) 22 (!) 26 (!) 21  SpO2: 99% 100% 99%   Eyes: PERRL, lids and conjunctivae normal ENMT: Mucous membranes are dry. Posterior pharynx clear of any exudate or lesions. Poor dentition.  Neck: normal, supple, no masses, no thyromegaly Respiratory: Tachypneic with coarse breath sounds and positive crackles appreciated at the bases Cardiovascular: Irregular irregular, no murmurs / rubs / gallops. No extremity edema. 2+ pedal pulses. No carotid bruits.  Abdomen: no tenderness, no masses palpated. No hepatosplenomegaly. Bowel sounds positive.  Musculoskeletal: no clubbing / cyanosis. No joint deformity upper and lower extremities.   Muscle wasting Skin: Pressure ulcer present Neurologic: CN 2-12 grossly intact.  Able to move all extremities Psychiatric: Normal judgment and insight. Alert and oriented x 3. Normal mood.     Labs on Admission: I have personally reviewed following labs and imaging studies  CBC: No results for input(s): WBC, NEUTROABS, HGB, HCT, MCV, PLT in the last 168 hours. Basic Metabolic Panel: No results for input(s): NA, K, CL, CO2, GLUCOSE, BUN, CREATININE, CALCIUM, MG, PHOS in the last 168 hours. GFR: Estimated Creatinine Clearance: 87.7 mL/min (by C-G formula based on SCr of 0.89 mg/dL). Liver Function Tests: No results for input(s): AST, ALT, ALKPHOS, BILITOT, PROT, ALBUMIN in the last 168 hours. No results for input(s): LIPASE, AMYLASE in the last 168 hours. No results for input(s): AMMONIA in the last 168 hours. Coagulation Profile: No results for input(s): INR, PROTIME in the last 168 hours. Cardiac Enzymes: No results for input(s): CKTOTAL, CKMB, CKMBINDEX,  TROPONINI in the last 168 hours. BNP (last 3 results) No results for input(s): PROBNP in the last 8760 hours. HbA1C: No results for input(s): HGBA1C in the last 72 hours. CBG: No results for input(s): GLUCAP in the last 168 hours. Lipid Profile: No results for input(s): CHOL, HDL, LDLCALC, TRIG, CHOLHDL, LDLDIRECT in the last 72 hours. Thyroid Function Tests: No results for input(s): TSH, T4TOTAL, FREET4, T3FREE, THYROIDAB in the last 72 hours. Anemia Panel: No results for input(s):   VITAMINB12, FOLATE, FERRITIN, TIBC, IRON, RETICCTPCT in the last 72 hours. Urine analysis:    Component Value Date/Time   COLORURINE YELLOW 02/19/2017 1145   APPEARANCEUR CLEAR 02/19/2017 1145   LABSPEC 1.012 02/19/2017 1145   PHURINE 7.0 02/19/2017 1145   GLUCOSEU NEGATIVE 02/19/2017 1145   HGBUR NEGATIVE 02/19/2017 1145   BILIRUBINUR NEGATIVE 02/19/2017 1145   KETONESUR NEGATIVE 02/19/2017 1145   PROTEINUR NEGATIVE 02/19/2017 1145   NITRITE NEGATIVE 02/19/2017 1145   LEUKOCYTESUR NEGATIVE 02/19/2017 1145   Sepsis Labs: No results found for this or any previous visit (from the past 240 hour(s)).   Radiological Exams on Admission: No results found.    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  Patient with new pulmonary emboli after being stopped on anticoagulation due to GI bleed.  Transferred for need of pulmonary consultative services. - Admit to stepdown bed  - Continuous pulse oximetry with nasal cannula oxygen as needed - Heparin drip per pharmacy - Check CBC, PT/INR - Pulmonary critical care consulted and will see patient  Paroxysmal atrial fibrillation: - Continue Cardizem drip as tolerated - Check EKG - Check magnesium  Combined systolic and diastolic congestive heart failure: Acute on chronic.  Last EF noted to be 10-15%, patient noted to be acutely fluid overloaded at outside facility, but hypotension limited diuresis. -  Strict I&O's and daily weights  - check BNP  Recent GI bleed  Dysphagia  - N.p.o. for now   DVT prophylaxis: Heparin Code Status: Full Family Communication: Discussed plan of care with the patient family present at bedside Disposition Plan: TBD  Consults called: PCCM  Admission status: Inpatient   Brynley Cuddeback A Jennie Bolar MD [facility] Pager [phone]   If 7PM-7AM, please contact night-coverage [email or web address] Password [email or web address]  02/23/2017, 2:44 AM     

## 2017-03-09 NOTE — Progress Notes (Signed)
   04-07-17 1400  Clinical Encounter Type  Visited With Patient and family together;Health care provider  Visit Type Initial;Follow-up;Psychological support;Spiritual support;Critical Care  Referral From Nurse  Spiritual Encounters  Spiritual Needs Prayer;Emotional  Stress Factors  Patient Stress Factors Loss of control;Exhausted  Family Stress Factors Loss of control;Exhausted  Advance Directives (For Healthcare)  Does Patient Have a Medical Advance Directive? No  Would patient like information on creating a medical advance directive? No - Patient declined   Name: Stephen Fleming, Stephen Fleming  Location: ICU 1240 Nature of Call: Critical     When chaplain arrived a few family members were on their way to the cafeteria-Chaplain shared time with son, granddaughter and Pt.  Chaplain started a conversation about comfort care- exploring hopes and fears with family. Family reported that they just wanted Pt to be comfortable in his own home if the Pt is ("in fact") actively dying.  . Family actively grieving: reminiscing about patient's active days. He loved to work and has been with his wife for over 40 years. -Family actively supporting one another in helpful ways. Chaplain supporting family as they share time with Pt. Pt seems to understand bits and pieces of conversations which are happening around him.  . Family responded well to scripture and prayer.  Lunette Stands encouraged and affirmed family's expressions of faith-Family's says they have faith that God will choose the "right time."  At this time, Family is still hoping that the Pt can "pull through this," however, they also understand that the Pt is unquestionably sick. They are "preparing for the worst."

## 2017-03-09 NOTE — Progress Notes (Signed)
CRITICAL VALUE ALERT  Critical Value:    MRSA PCR Positive Troponin 0.09 Potassium 2.7  Date & Time Notied:  02/21/2017 0750  Provider Notified: MD updated at bedside  Orders Received/Actions taken:

## 2017-03-09 NOTE — Progress Notes (Signed)
@IPLOG @        PROGRESS NOTE                                                                                                                                                                                                             Patient Demographics:    Stephen Fleming, is a 62 y.o. male, DOB - 1955/07/21, BJY:[id number]  Admit date - Mar 16, 2017   Admitting Physician Rometta Emery, MD  Outpatient Primary MD for the patient is Louie Boston., MD  LOS - 0  No chief complaint on file.      Brief Narrative   Stephen Fleming is a 62 y.o. 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    Patient had originally been seen at [facility] on 1/21, with bilateral leg pain, hemoptysis, severe dyspnea on exertion, found to have bilateral LE DVT's and acute submassive PE on CTA with RV/LV of 1, right heart strain noted on TTE. He was placed on heparin, somewhat hypotensive and was transferred to [facility] for consideration of thrombolytics. No thrombolytics were indicated. Heart failure team was consulted for acute systolic LV and right heart failure.He experienced PAF with RVR since returning to [facility] on amiodarone.   On admission at [facility] on 2/11, patient was initially noted to be hypoxic with O2 saturations 85% on room air initially and blood pressures borderline hypotensive.  Examination revealed melanotic stools that were guaiac positive.  Hemoglobin at that time was noted to be 11.7.  Patient also had blood in his Foley catheter and a stage I decubitus ulcer noted.  Due to his symptoms it was felt the bleeding was related to the effects  of anticoagulation in case syndrome was given as well as 1 unit of blood.  Repeat hemoglobin was noted to remain at 11 on admission.  CT scan of the abdomen in the emergency room showed signs of pyelonephritis and signs of a developing pneumonia in the right middle and lower lobes suspected to be due to aspiration as patient has had issues with dysphasia during his previous hospital admissions.  During this period in time patient remains off anticoagulation and was given empiric antibiotics.  GI bleed symptoms resolved as well as hemoptysis.  He was evaluated by GI with colonoscopy and EGD on 2/14, and bleed symptoms and it was thought to likely be diverticular in nature.  However, patient was reported to have developed hemoptysis again on 2/16.  A CT scan was performed showing multiple lung lesions consistent with new pulmonary emboli and reported caseating lesion.  He also was noted to have gone into A. fib with RVR for which he was placed on a Cardizem drip with improvement of heart rates.  No reports of repeat GI bleed noted.  Transfer was accepted to stepdown bed at [facility].  After more research it was found the patient did not have new blood clots on the CT scan done on 03/08/2017 at [facility], also son reveals that he has been minimally responsive for the last 1 week at that facility.   Subjective:   Change in bed, somnolent, unreliable historian but did answer a few questions denied any headache or chest pain, moving all 4 extremities to command.   Assessment  & Plan :     1.  Chronic atrial fibrillation with RVR.  Initially required Cardizem drip, currently rate controlled, since oral status is tenuous have placed him on scheduled low-dose IV Lopressor, 1 dose of IV digoxin and monitor.  If develops RVR will try amiodarone drip now.  He was on oral amiodarone which has been on hold.  Advance to score is at least 3, currently on heparin drip.  2.  History of DVT PE.  Less than 1 month ago,  no new clots on CT scan done at  [facility] on 03/08/2017, currently on heparin drip, was on Eliquis prior to this.  If any signs of ongoing bleeding will consider IVC filter.  Lower extremity venous duplex ordered to look at clot burden, repeat echo also ordered.  3. HX of GI bleed and possible mild hemoptysis.  Per family recent EGD colonoscopy at [facility] unremarkable, this was affected to be diverticular bleed in the setting of Eliquis, mild hemoptysis likely due to cavitary lung lesion versus lung infarct due to large PE.  The symptoms will be monitored currently no signs of ongoing bleeding, will type screen, hemoglobin currently around 11.  Did get a unit of packed RBC at the previous hospital a few days prior to this admission.  4.  Cavitary lung lesion on the right side on CT scan done on 03/08/2017 at [facility].  Could be post infarct lung but could be also due to ongoing dysphagia and aspiration, he has had issues with swallowing food at [facility].  For now placed on vancomycin and Zosyn since he had leukocytosis, n.p.o. till mentation is better, speech to evaluate.  We will continue to monitor P CCM on board.  5.  Metabolic and toxic encephalopathy with delirium.  Supportive care, CT head done on 02/26/2017 shows no acute infarct.  6.  CAD with ischemic cardiomyopathy and chronic combined systolic and diastolic CHF with last EF 10% a few weeks ago.  Supportive care, avoid calcium channel blocker, beta-blocker ACE inhibitor and Lasix once blood pressure allows.  Cardiology consulted.  Currently troponin trend is stable and likely mild rise due to demand ischemia from #1 above.  Low-dose Lasix as tolerated by blood pressure for now.  Currently does not appear to be in frank CHF.  Due to bleeding will avoid aspirin, statin, oral beta-blocker, ACE inhibitor, oral Lasix once mentation improves and blood pressure is better.  Repeat echocardiogram ordered.  7.  Recent possible diverticular  bleed.  Currently stable with monitor.  8.  Hypokalemia.  Replaced.  Monitor.     Diet : Diet NPO time specified    Family Communication  : Wife, sons and home family bedside on 02/28/2017  Code Status : DNR  Disposition Plan  : Stay in PCU  Consults  : Pulmonary, cardiology  Procedures  :    CT head.  Nonacute.  Echocardiogram  Lower extremity venous duplex    DVT Prophylaxis  :    Heparin    Lab Results  Component Value Date   PLT 227 03/16/2017    Inpatient Medications  Scheduled Meds: . amiodarone  200 mg Oral Daily  . atorvastatin  40 mg Oral q1800  . Chlorhexidine Gluconate Cloth  6 each Topical Q0600  . digoxin  0.125 mg Intravenous Once  . digoxin  0.125 mg Oral Daily  . feeding supplement (ENSURE ENLIVE)  237 mL Oral QID  . furosemide  40 mg Oral Daily  . iopamidol      . metoprolol tartrate  25 mg Oral BID  . mometasone-formoterol  2 puff Inhalation BID  . mupirocin ointment  1 application Nasal BID  . polyethylene glycol  17 g Oral Daily  . potassium chloride  40 mEq Oral Q6H  . sodium chloride      . sucralfate  1 g Oral TID WC & HS   Continuous Infusions: . heparin    . magnesium sulfate 1 - 4 g bolus IVPB    . piperacillin-tazobactam (ZOSYN)  IV 3.375 g (02/28/2017 1128)  . potassium chloride 10 mEq (03/16/2017 1208)  . vancomycin 1,500 mg (02/25/2017 1136)   PRN Meds:.acetaminophen **OR** acetaminophen, ipratropium-albuterol, ondansetron **OR** ondansetron (ZOFRAN) IV  Antibiotics  :    Anti-infectives (From admission, onward)   Start     Dose/Rate Route Frequency Ordered Stop   02/22/2017 1030  piperacillin-tazobactam (ZOSYN) IVPB 3.375 g     3.375 g 12.5 mL/hr over 240 Minutes Intravenous Every 8 hours 03/05/2017 1014     03/04/2017 1030  vancomycin (VANCOCIN) 1,500 mg in sodium chloride 0.9 % 500 mL IVPB     1,500 mg 250 mL/hr over 120 Minutes Intravenous  Once 02/25/2017 1014           Objective:   Vitals:   03/01/2017 0600 03/14/2017  0700 03/08/2017 0800 03/19/2017 1051  BP: 97/69 110/79    Pulse: (!) 47 95    Resp: (!) 26 (!) 21    Temp:   97.7 F (36.5 C)   TempSrc:   Axillary   SpO2: 100% 99%  100%    Wt Readings from Last 3 Encounters:  02/26/17 76.2 kg (167 lb 15.9 oz)  01/22/17 80.7 kg (178 lb)  01/01/17 82.1 kg (181 lb)     Intake/Output Summary (Last 24 hours) at 02/23/2017 1237 Last data filed at 02/28/2017 0700 Gross per 24 hour  Intake -  Output 800 ml  Net -800 ml     Physical Exam  Somnolent, No new F.N deficits, Normal affect [place].AT,PERRAL Supple Neck,No JVD, No cervical lymphadenopathy appriciated.  Symmetrical Chest wall movement, Good air movement bilaterally, few rales iRRR,No Gallops,Rubs or new Murmurs, No Parasternal Heave +ve B.Sounds, Abd Soft, No tenderness, No organomegaly appriciated, No rebound - guarding or rigidity. No Cyanosis, Clubbing or edema, No new Rash or bruise      Data Review:    CBC Recent Labs  Lab 02/23/2017 0640 03/16/2017 0934  WBC 13.5*  --   HGB 11.9* 11.9*  HCT 36.6* 36.7*  PLT 227  --   MCV 93.8  --   MCH 30.5  --   MCHC 32.5  --   RDW 16.2*  --   LYMPHSABS 2.7  --   MONOABS 1.0  --   EOSABS 0.1  --   BASOSABS 0.1  --     Chemistries  Recent Labs  Lab 03-28-17 0640  NA 144  K 2.7*  CL 107  CO2 29  GLUCOSE 119*  BUN 12  CREATININE 0.67  CALCIUM 7.8*  MG 1.7  AST 41  ALT 32  ALKPHOS 77  BILITOT 1.4*   ------------------------------------------------------------------------------------------------------------------ No results for input(s): CHOL, HDL, LDLCALC, TRIG, CHOLHDL, LDLDIRECT in the last 72 hours.  Lab Results  Component Value Date   HGBA1C 5.4 12/05/2016   ------------------------------------------------------------------------------------------------------------------ Recent Labs    03/28/17 0934  TSH 2.800    ------------------------------------------------------------------------------------------------------------------ No results for input(s): VITAMINB12, FOLATE, FERRITIN, TIBC, IRON, RETICCTPCT in the last 72 hours.  Coagulation profile Recent Labs  Lab 2017/03/28 0640  INR 1.70    No results for input(s): DDIMER in the last 72 hours.  Cardiac Enzymes Recent Labs  Lab 2017/03/28 0640 2017-03-28 0934  TROPONINI 0.09* 0.07*   ------------------------------------------------------------------------------------------------------------------    Component Value Date/Time   BNP 4,085.6 (H) 03-28-2017 0640    Micro Results Recent Results (from the past 240 hour(s))  MRSA PCR Screening     Status: Abnormal   Collection Time: Mar 28, 2017  4:29 AM  Result Value Ref Range Status   MRSA by PCR POSITIVE (A) NEGATIVE Final    Comment:        The GeneXpert MRSA Assay (FDA approved for NASAL specimens only), is one component of a comprehensive MRSA colonization surveillance program. It is not intended to diagnose MRSA infection nor to guide or monitor treatment for MRSA infections. CRITICAL RESULT CALLED TO, READ BACK BY AND VERIFIED WITH: S.Susann Givens RN 03/28/17 0740 JR Performed at [facility], [address], [place], [place] [address]     Radiology Reports   Ct Head Wo Contrast  Result Date: 28-Mar-2017 CLINICAL DATA:  Altered level of consciousness. EXAM: CT HEAD WITHOUT CONTRAST TECHNIQUE: Contiguous axial images were obtained from the base of the skull through the vertex without intravenous contrast. COMPARISON:  02/22/2017 FINDINGS: Brain: No evidence of acute infarction, hemorrhage, hydrocephalus, extra-axial collection or mass lesion/mass effect. Prominence of the sulci and ventricles identified compatible with brain atrophy. Vascular: No hyperdense vessel or unexpected calcification. Skull: Normal. Negative for fracture or focal lesion. Sinuses/Orbits: No  acute finding. Other: None. IMPRESSION: 1. No acute intracranial abnormalities. 2. Mild brain atrophy. Electronically Signed   By: Signa Kell M.D.   On: 28-Mar-2017 10:41     Time Spent in minutes  30   Susa Raring M.D on 03/28/17 at 12:37 PM  Between 7am to 7pm - Pager - [phone] ( page via [email or web address], text pages only, please mention full 10 digit call back number). After 7pm go to [email or web address] - password [email or web address]

## 2017-03-09 NOTE — H&P (Addendum)
History and Physical    Stephen Fleming:[id number] DOB: [birth year] DOA: (Not on file)  Referring MD/NP/PA: Dr. Lonia Blood PCP: Louie Boston., MD  Patient coming from: Transfer from [facility] health care  Chief Complaint: Coughing up blood  I have personally briefly reviewed patient's old medical records in [facility]   HPI: Stephen Fleming is a 62 y.o. 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    Patient had originally been seen at [facility] on 1/21, with bilateral leg pain, hemoptysis, severe dyspnea on exertion, found to have bilateral LE DVT's and acute submassive PE on CTA with RV/LV of 1, right heart strain noted on TTE. He was placed on heparin, somewhat hypotensive and was transferred to [facility] for consideration of thrombolytics. No thrombolytics were indicated. Heart failure team was consulted for acute systolic LV and right heart failure.He experienced PAF with RVR since returning to [facility] on amiodarone.   On admission at [facility] on 2/11, patient was initially noted to be hypoxic with O2 saturations 85% on room air initially and blood pressures borderline hypotensive.  Examination revealed melanotic stools that were guaiac positive.  Hemoglobin at that time was noted to be 11.7.  Patient also had blood in his Foley catheter and a stage I decubitus ulcer noted.  Due to his symptoms it was felt the bleeding was related to the effects of anticoagulation in case syndrome was given as well as 1 unit of blood.  Repeat hemoglobin was noted to remain at 11 on admission.  CT scan of the abdomen in the emergency room  showed signs of pyelonephritis and signs of a developing pneumonia in the right middle and lower lobes suspected to be due to aspiration as patient has had issues with dysphasia during his previous hospital admissions.  During this period in time patient remains off anticoagulation and was given empiric antibiotics.  GI bleed symptoms resolved as well as hemoptysis.  He was evaluated by GI with colonoscopy and EGD on 2/14, and bleed symptoms and it was thought to likely be diverticular in nature.  However, patient was reported to have developed hemoptysis again on 2/16.  A CT scan was performed showing multiple lung lesions consistent with new pulmonary emboli and reported caseating lesion.  He also was noted to have gone into A. fib with RVR for which he was placed on a Cardizem drip with improvement of heart rates.  No reports of repeat GI bleed noted.  Transfer was accepted to stepdown bed at [facility].  ED Course: as seen above   Review of Systems  Constitutional: Positive for malaise/fatigue. Negative for fever.  HENT: Negative for congestion and nosebleeds.   Eyes: Negative for pain and discharge.  Respiratory: Positive for cough, hemoptysis and shortness of breath.   Cardiovascular: Positive for leg swelling. Negative for chest pain.  Gastrointestinal: Negative for blood in stool, constipation, nausea and vomiting.  Genitourinary: Positive for hematuria. Negative for dysuria.  Musculoskeletal: Negative for falls.  Skin: Negative for rash.  Neurological: Positive for speech change and weakness. Negative for focal weakness and loss of consciousness.  Psychiatric/Behavioral: Negative for substance abuse.    Past Medical History:  Diagnosis  Date  . Arthritis    "probably in all my joints" (12/03/2016)  . Asthma   . CAD (coronary artery disease)    a. LHC 12/04/16: 30% RCA, 50% RPDA, 25% mid LAD, 95% 1st diag, 70% 2nd diag.  . CHF (congestive heart failure), NYHA class II, chronic,  combined (HCC)    12/04/16: Echo EF 20-25%, Gr 3 DD, PA peak pressure 62 mmgHg, RHC: PCPW 29 mmHg  . Eczema   . Gout    "I take RX for it" (12/03/2016)  . Heart murmur   . Hypertension   . Mitral regurgitation    12/04/16: moderate by echo  . OSA (obstructive sleep apnea)    "never could tolerate the mask" (12/03/2016)  . Pulmonary hypertension (HCC)    12/04/16: RHC: Mod PHTN, PAP 37 mmHg    Past Surgical History:  Procedure Laterality Date  . APPENDECTOMY  ~  1967  . BACK SURGERY    . COLONOSCOPY  ~ 2008  . ESOPHAGOGASTRODUODENOSCOPY  ~ 2008  . INGUINAL HERNIA REPAIR Right ~ 1976  . LUMBAR DISC SURGERY  1991; 1993   "[name] did them both"  . RIGHT/LEFT HEART CATH AND CORONARY ANGIOGRAPHY N/A 12/04/2016   Procedure: RIGHT/LEFT HEART CATH AND CORONARY ANGIOGRAPHY;  Surgeon: Swazil[place], Peter M, MD;  Location: [facility] INVASIVE CV LAB;  Service: Cardiovascular;  Laterality: N/A;     reports that  has never smoked. he has never used smokeless tobacco. He reports that he drinks about 12.0 oz of alcohol per week. He reports that he does not use drugs.  No Known Allergies  Family History  Problem Relation Age of Onset  . Heart attack Mother   . CAD Father   . Heart attack Brother     Prior to Admission medications   Medication Sig Start Date End Date Taking? Authorizing Provider  albuterol (PROVENTIL HFA;VENTOLIN HFA) 108 (90 BASE) MCG/ACT inhaler Inhale 2 puffs into the lungs every 6 (six) hours as needed for wheezing.    [provider]  amiodarone (PACERONE) 200 MG tablet Take 1 tablet (200 mg total) by mouth daily. 02/26/17   Regalado, Belkys A, MD  apixaban (ELIQUIS) 5 MG TABS tablet Take 2 tablets (10 mg total) by mouth 2 (two) times daily. 02/26/17   Regalado, Belkys A, MD  apixaban (ELIQUIS) 5 MG TABS tablet Take 1 tablet (5 mg total) by mouth 2 (two) times daily. 03/03/17   Regalado, Belkys A, MD  atorvastatin (LIPITOR) 40 MG tablet Take 1 tablet (40 mg total) by  mouth daily at 6 PM. 02/26/17   Regalado, Belkys A, MD  carvedilol (COREG) 3.125 MG tablet Take 1 tablet (3.125 mg total) by mouth 2 (two) times daily with a meal. 02/26/17   Regalado, Belkys A, MD  digoxin (LANOXIN) 0.125 MG tablet Take 1 tablet (0.125 mg total) by mouth daily. 02/26/17   Regalado, Belkys A, MD  feeding supplement, ENSURE ENLIVE, (ENSURE ENLIVE) LIQD Take 237 mLs by mouth 4 (four) times daily. 02/26/17   Regalado, Belkys A, MD  Fluticasone-Salmeterol (ADVAIR) 250-50 MCG/DOSE AEPB Inhale 1 puff into the lungs every 12 (twelve) hours.    [provider]  folic acid (FOLVITE) 1 MG tablet Take 1 tablet (1 mg total) by mouth daily. 02/26/17   Regalado, Belkys A, MD  furosemide (LASIX) 40 MG tablet Take 1 tablet (40 mg total) by mouth daily. 02/26/17   Regalado, Belkys A, MD  hydrocortisone (ANUSOL-HC) 25 MG suppository Place 1 suppository (25  mg total) rectally 2 (two) times daily as needed for hemorrhoids or anal itching. 02/26/17   Regalado, Belkys A, MD  losartan (COZAAR) 25 MG tablet Take 0.5 tablets (12.5 mg total) by mouth 2 (two) times daily. 02/26/17   Regalado, Belkys A, MD  Multiple Vitamin (MULTIVITAMIN WITH MINERALS) TABS tablet Take 1 tablet by mouth daily. 02/26/17   Regalado, Belkys A, MD  polyethylene glycol powder (GLYCOLAX/MIRALAX) powder Take 17 g daily by mouth. 08/31/16   [provider]  QUEtiapine (SEROQUEL) 25 MG tablet Take 1 tablet (25 mg total) by mouth at bedtime. 02/26/17   Regalado, Belkys A, MD  spironolactone (ALDACTONE) 25 MG tablet Take 1 tablet (25 mg total) by mouth daily. 02/26/17   Regalado, Belkys A, MD  thiamine 100 MG tablet Take 1 tablet (100 mg total) by mouth daily. 02/26/17   Regalado, Belkys A, MD  traMADol (ULTRAM) 50 MG tablet Take 0.5 tablets (25 mg total) by mouth every 12 (twelve) hours as needed for moderate pain or severe pain. 02/26/17   Regalado, Belkys A, MD  Vitamin D, Ergocalciferol, (DRISDOL) 50000 units CAPS capsule Take 1 capsule (50,000  Units total) by mouth every 7 (seven) days. 03/04/17   Regalado, Prentiss Bells, MD    Physical Exam:  Constitutional: Male that appears older than stated age with mouth open, but speech is slurred Vitals:   03/19/17 0500 03/19/17 0600 03/19/2017 0700  BP: 94/71 97/69 110/79  Pulse: (!) 46 (!) 47 95  Resp: (!) 22 (!) 26 (!) 21  SpO2: 99% 100% 99%   Eyes: PERRL, lids and conjunctivae normal ENMT: Mucous membranes are dry. Posterior pharynx clear of any exudate or lesions. Poor dentition.  Neck: normal, supple, no masses, no thyromegaly Respiratory: Tachypneic with coarse breath sounds and positive crackles appreciated at the bases Cardiovascular: Irregular irregular, no murmurs / rubs / gallops. No extremity edema. 2+ pedal pulses. No carotid bruits.  Abdomen: no tenderness, no masses palpated. No hepatosplenomegaly. Bowel sounds positive.  Musculoskeletal: no clubbing / cyanosis. No joint deformity upper and lower extremities.   Muscle wasting Skin: Pressure ulcer present Neurologic: CN 2-12 grossly intact.  Able to move all extremities Psychiatric: Normal judgment and insight. Alert and oriented x 3. Normal mood.     Labs on Admission: I have personally reviewed following labs and imaging studies  CBC: No results for input(s): WBC, NEUTROABS, HGB, HCT, MCV, PLT in the last 168 hours. Basic Metabolic Panel: No results for input(s): NA, K, CL, CO2, GLUCOSE, BUN, CREATININE, CALCIUM, MG, PHOS in the last 168 hours. GFR: Estimated Creatinine Clearance: 87.7 mL/min (by C-G formula based on SCr of 0.89 mg/dL). Liver Function Tests: No results for input(s): AST, ALT, ALKPHOS, BILITOT, PROT, ALBUMIN in the last 168 hours. No results for input(s): LIPASE, AMYLASE in the last 168 hours. No results for input(s): AMMONIA in the last 168 hours. Coagulation Profile: No results for input(s): INR, PROTIME in the last 168 hours. Cardiac Enzymes: No results for input(s): CKTOTAL, CKMB, CKMBINDEX,  TROPONINI in the last 168 hours. BNP (last 3 results) No results for input(s): PROBNP in the last 8760 hours. HbA1C: No results for input(s): HGBA1C in the last 72 hours. CBG: No results for input(s): GLUCAP in the last 168 hours. Lipid Profile: No results for input(s): CHOL, HDL, LDLCALC, TRIG, CHOLHDL, LDLDIRECT in the last 72 hours. Thyroid Function Tests: No results for input(s): TSH, T4TOTAL, FREET4, T3FREE, THYROIDAB in the last 72 hours. Anemia Panel: No results for input(s):  VITAMINB12, FOLATE, FERRITIN, TIBC, IRON, RETICCTPCT in the last 72 hours. Urine analysis:    Component Value Date/Time   COLORURINE YELLOW 02/19/2017 1145   APPEARANCEUR CLEAR 02/19/2017 1145   LABSPEC 1.012 02/19/2017 1145   PHURINE 7.0 02/19/2017 1145   GLUCOSEU NEGATIVE 02/19/2017 1145   HGBUR NEGATIVE 02/19/2017 1145   BILIRUBINUR NEGATIVE 02/19/2017 1145   KETONESUR NEGATIVE 02/19/2017 1145   PROTEINUR NEGATIVE 02/19/2017 1145   NITRITE NEGATIVE 02/19/2017 1145   LEUKOCYTESUR NEGATIVE 02/19/2017 1145   Sepsis Labs: No results found for this or any previous visit (from the past 240 hour(s)).   Radiological Exams on Admission: No results found.    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  Patient with new pulmonary emboli after being stopped on anticoagulation due to GI bleed.  Transferred for need of pulmonary consultative services. - Admit to stepdown bed  - Continuous pulse oximetry with nasal cannula oxygen as needed - Heparin drip per pharmacy - Check CBC, PT/INR - Pulmonary critical care consulted and will see patient  Paroxysmal atrial fibrillation: - Continue Cardizem drip as tolerated - Check EKG - Check magnesium  Combined systolic and diastolic congestive heart failure: Acute on chronic.  Last EF noted to be 10-15%, patient noted to be acutely fluid overloaded at outside facility, but hypotension limited diuresis. -  Strict I&O's and daily weights  - check BNP  Recent GI bleed  Dysphagia  - N.p.o. for now   DVT prophylaxis: Heparin Code Status: Full Family Communication: Discussed plan of care with the patient family present at bedside Disposition Plan: TBD  Consults called: PCCM  Admission status: Inpatient   Clydie Braun MD [facility] Pager [phone]   If 7PM-7AM, please contact night-coverage [email or web address] Password [facility]  2017-03-24, 2:44 AM

## 2017-03-10 ENCOUNTER — Inpatient Hospital Stay (HOSPITAL_COMMUNITY): Payer: BLUE CROSS/BLUE SHIELD

## 2017-03-10 DIAGNOSIS — I5023 Acute on chronic systolic (congestive) heart failure: Secondary | ICD-10-CM

## 2017-03-10 DIAGNOSIS — R1310 Dysphagia, unspecified: Secondary | ICD-10-CM

## 2017-03-10 DIAGNOSIS — I480 Paroxysmal atrial fibrillation: Secondary | ICD-10-CM

## 2017-03-10 DIAGNOSIS — I2510 Atherosclerotic heart disease of native coronary artery without angina pectoris: Secondary | ICD-10-CM

## 2017-03-10 DIAGNOSIS — I2601 Septic pulmonary embolism with acute cor pulmonale: Secondary | ICD-10-CM

## 2017-03-10 DIAGNOSIS — I5042 Chronic combined systolic (congestive) and diastolic (congestive) heart failure: Secondary | ICD-10-CM

## 2017-03-10 DIAGNOSIS — Z8719 Personal history of other diseases of the digestive system: Secondary | ICD-10-CM

## 2017-03-10 LAB — CBC
HCT: 37.4 % — ABNORMAL LOW (ref 39.0–52.0)
HEMOGLOBIN: 11.9 g/dL — AB (ref 13.0–17.0)
MCH: 29.8 pg (ref 26.0–34.0)
MCHC: 31.8 g/dL (ref 30.0–36.0)
MCV: 93.7 fL (ref 78.0–100.0)
PLATELETS: 273 10*3/uL (ref 150–400)
RBC: 3.99 MIL/uL — AB (ref 4.22–5.81)
RDW: 16 % — ABNORMAL HIGH (ref 11.5–15.5)
WBC: 16.9 10*3/uL — ABNORMAL HIGH (ref 4.0–10.5)

## 2017-03-10 LAB — BRAIN NATRIURETIC PEPTIDE: B Natriuretic Peptide: 1781.4 pg/mL — ABNORMAL HIGH (ref 0.0–100.0)

## 2017-03-10 LAB — BASIC METABOLIC PANEL
Anion gap: 12 (ref 5–15)
BUN: 16 mg/dL (ref 6–20)
CHLORIDE: 101 mmol/L (ref 101–111)
CO2: 24 mmol/L (ref 22–32)
Calcium: 7.8 mg/dL — ABNORMAL LOW (ref 8.9–10.3)
Creatinine, Ser: 0.79 mg/dL (ref 0.61–1.24)
GFR calc Af Amer: >60 mL/min (ref >60)
GFR calc non Af Amer: >60 mL/min (ref >60)
GLUCOSE: 128 mg/dL — AB (ref 65–99)
POTASSIUM: 3.7 mmol/L (ref 3.5–5.1)
SODIUM: 137 mmol/L (ref 135–145)

## 2017-03-10 LAB — MAGNESIUM: Magnesium: 1.7 mg/dL (ref 1.7–2.4)

## 2017-03-10 LAB — HEPARIN LEVEL (UNFRACTIONATED): Heparin Unfractionated: 0.36 IU/mL (ref 0.30–0.70)

## 2017-03-10 LAB — TYPE AND SCREEN
ABO/RH(D): O POS
ANTIBODY SCREEN: NEGATIVE

## 2017-03-10 LAB — ABO/RH: ABO/RH(D): O POS

## 2017-03-10 MED ORDER — ADULT MULTIVITAMIN W/MINERALS CH: 1.0000 | ORAL_TABLET | Freq: Every day | ORAL | Status: DC

## 2017-03-10 MED ORDER — AMIODARONE HCL 200 MG PO TABS: 200.0000 mg | ORAL_TABLET | Freq: Every day | ORAL | Status: DC

## 2017-03-10 MED ORDER — HALOPERIDOL LACTATE 5 MG/ML IJ SOLN
2.0000 mg | Freq: Once | INTRAMUSCULAR | Status: AC
  Administered 2017-03-10: 2 mg via INTRAVENOUS
  Filled 2017-03-10: qty 1

## 2017-03-10 MED ORDER — SODIUM CHLORIDE 0.9 % IV SOLN
10.0000 mg/h | INTRAVENOUS | Status: DC
  Administered 2017-03-10: 2 mg/h via INTRAVENOUS
  Filled 2017-03-10 (×2): qty 10

## 2017-03-10 MED ORDER — QUETIAPINE FUMARATE 50 MG PO TABS: 25.0000 mg | ORAL_TABLET | Freq: Two times a day (BID) | ORAL | Status: DC

## 2017-03-10 MED ORDER — PRO-STAT SUGAR FREE PO LIQD
30.0000 mL | Freq: Three times a day (TID) | ORAL | Status: DC
  Filled 2017-03-10: qty 30

## 2017-03-10 MED ORDER — MORPHINE SULFATE (PF) 4 MG/ML IV SOLN
INTRAVENOUS | Status: AC
  Administered 2017-03-10: 2 mg
  Filled 2017-03-10: qty 1

## 2017-03-10 MED ORDER — ENSURE ENLIVE PO LIQD: 237.0000 mL | Freq: Two times a day (BID) | ORAL | Status: DC

## 2017-03-10 MED ORDER — DIGOXIN 125 MCG PO TABS: 0.1250 mg | ORAL_TABLET | Freq: Every day | ORAL | Status: DC

## 2017-03-10 MED ORDER — FUROSEMIDE 10 MG/ML IJ SOLN
40.0000 mg | Freq: Once | INTRAMUSCULAR | Status: AC
  Administered 2017-03-10: 40 mg via INTRAVENOUS
  Filled 2017-03-10: qty 4

## 2017-03-10 MED ORDER — MORPHINE SULFATE (PF) 4 MG/ML IV SOLN
2.0000 mg | INTRAVENOUS | Status: DC | PRN
  Administered 2017-03-10 (×2): 2 mg via INTRAVENOUS
  Filled 2017-03-10 (×2): qty 1

## 2017-03-10 MED ORDER — CARVEDILOL 3.125 MG PO TABS: 3.1250 mg | ORAL_TABLET | Freq: Two times a day (BID) | ORAL | Status: DC

## 2017-03-10 MED ORDER — MORPHINE BOLUS VIA INFUSION
5.0000 mg | INTRAVENOUS | Status: DC | PRN
  Administered 2017-03-10: 3 mg via INTRAVENOUS
  Filled 2017-03-10: qty 20

## 2017-03-20 ENCOUNTER — Other Ambulatory Visit (HOSPITAL_COMMUNITY): Payer: BLUE CROSS/BLUE SHIELD

## 2017-03-21 NOTE — Progress Notes (Signed)
No heartbeat auscultated by Annamaria Boots, RN and Hoyle Sauer, RN. Pt expired at 1804 on 01/07/18. Md notified.

## 2017-03-21 NOTE — Progress Notes (Signed)
175 cc morphine wasted in sink. Hubbard Robinson, RN witness to waste.

## 2017-03-21 NOTE — Progress Notes (Signed)
Speech Language Pathology Discharge Patient Details Name: Stephen Fleming MRN: [id number] DOB: Nov 22, 1955 Today's Date: 2017/04/07 Time:  -     Patient discharged from SLP services secondary to medical decline - will need to re-order SLP to resume therapy services. Pt on non rebreather mask, guppy breathing and expected to expire soon per MD note. Please see latest therapy progress note for current level of functioning and progress toward goals.    Progress and discharge plan discussed with patient and/or caregiver: Patient unable to participate in discharge planning and no caregivers available  GO     Royce Macadamia 07-Apr-2017, 2:10 PM   Breck Coons Lonell Face.Ed [employer] [phone]

## 2017-03-21 NOTE — Progress Notes (Signed)
PT Cancellation Note  Patient Details Name: Stephen Fleming MRN: [id number] DOB: 1955/04/10   Cancelled Treatment:    Reason Eval/Treat Not Completed: PT screened, no needs identified, will sign off(per RN, pt is actively dying.)   Tamala Ser 03/02/2017, 2:22 PM [phone]

## 2017-03-21 NOTE — Progress Notes (Deleted)
PULMONARY / CRITICAL CARE MEDICINE   Name: Stephen Fleming MRN: [id number] DOB: Jul 09, 1955    ADMISSION DATE:  04-02-2017 CONSULTATION DATE:  02-Apr-2017  REFERRING MD: Susa Raring MD  CHIEF COMPLAINT: Pulmonary embolism,   HISTORY OF PRESENT ILLNESS:   62 year old with history of hypertension, severe coronary artery disease, heart failure [EF 10-15%], paroxysmal atrial fibrillation.  He had a recent admission at [facility] for bilateral DVT (2/6-2/13), submassive PE.  EKOS were considered but not administered as he was stable.  He developed left lung consolidation thought to be secondary to infarction versus pneumonia, treated with broad-spectrum antibiotics, left chest tube placed by IR on 2/1- 2/4  Admitted to [facility] on 2/11 with melanotic stools (100- 200cc). His hemoglobin did not drop.  EGD, colonoscopy did not show any acute lesion.  He was restarted on anticoagulation and sent to [facility] for further evaluation (2/17).   SUBJECTIVE:  RN reports pt SpO2 difficult to pick up / read.  O2 early in shift on 3L, now on 5-6 L.  Wife went home early in the am to rest.  Pt just given haldol for agitation.  Heparin gtt turned off due to hemoptysis (11am).   VITAL SIGNS: BP 93/70   Pulse (!) 101   Temp 99.1 F (37.3 C) (Oral)   Resp (!) 21   Ht 5\' 10"  (1.778 m)   Wt 151 lb 3.8 oz (68.6 kg)   SpO2 97%   BMI 21.70 kg/m   HEMODYNAMICS:    VENTILATOR SETTINGS:    INTAKE / OUTPUT: I/O last 3 completed shifts: In: 881.2 [I.V.:239.5; IV Piggyback:641.7] Out: 2075 [Urine:2075]  PHYSICAL EXAMINATION: General: ill appearing male lying in bed HEENT: MM pink/dry, dried blood in mouth, + JVD  Neuro: awake, appears anxious CV: s1s2 rrr, tachy PULM: even/non-labored, lungs bilaterally diminished, bronchial breath sounds on left GI: soft, non-tender, bsx4 active  Extremities: warm/dry, 1+ pitting BLE edema  Skin: pale, bluish tint, mottling   LABS:  BMET Recent  Labs  Lab 04/02/2017 0640 02/22/2017 0512  NA 144 137  K 2.7* 3.7  CL 107 101  CO2 29 24  BUN 12 16  CREATININE 0.67 0.79  GLUCOSE 119* 128*    Electrolytes Recent Labs  Lab 04-02-2017 0640 03/19/2017 0512  CALCIUM 7.8* 7.8*  MG 1.7 1.7    CBC Recent Labs  Lab 2017/04/02 0640  2017/04/02 1717 2017-04-02 2137 03/07/2017 0512  WBC 13.5*  --   --   --  16.9*  HGB 11.9*   < > 12.5* 12.9* 11.9*  HCT 36.6*   < > 39.7 40.0 37.4*  PLT 227  --   --   --  273   < > = values in this interval not displayed.    Coag's Recent Labs  Lab 2017/04/02 0640  APTT 69*  INR 1.70    Sepsis Markers No results for input(s): LATICACIDVEN, PROCALCITON, O2SATVEN in the last 168 hours.  ABG Recent Labs  Lab 04/02/2017 1005  PHART 7.451*  PCO2ART 45.5  PO2ART 129*    Liver Enzymes Recent Labs  Lab 2017-04-02 0640  AST 41  ALT 32  ALKPHOS 77  BILITOT 1.4*  ALBUMIN 2.1*    Cardiac Enzymes Recent Labs  Lab April 02, 2017 0640 04/02/2017 0934  TROPONINI 0.09* 0.07*    Glucose No results for input(s): GLUCAP in the last 168 hours.  Imaging CT angiogram 02/11/17-extensive bilateral emboli, consolidation of the left lower lobe.  CT scan 02/19/17-moderate to  large loculated left effusion, dense airspace consolidation of the left lower lobe.  CT scan [facility] 03/08/17-Persistent pulmonary embolism on the left with mild improvement in pulmonary embolism on the right.  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     STUDIES:  CT Head 2/17 >> mild atrophy, no acute intracranial process  CULTURES: Sputum 2/18 >>   ANTIBIOTICS: Vancomycin 2/17 >> Zosyn 2/17 >>  SIGNIFICANT EVENTS: 2/17 Transfer from [place] with c/o melena  2/18  Increased agitation, O2 needs  LINES/TUBES:   DISCUSSION: 61 year old with complicated history, severe cardiomyopathy, recent PE, DVT  on anticoagulation.  Developed GI bleed with melena, hemoptysis, bleeding from the Foley at [facility] > stable now.    CT scan 2/16 reviewed which shows no new clot.  He has extensive consolidation with cavitation of the left lung with bronchopleural fistula.  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  History noted for left chest tube placed early February for loculated effusion by IR.   A: Acute pulmonary embolism, DVT (thrombus in transit seen in RA on ECHO 02/11/17) Hemoptysis  P: Hold heparin gtt for now with ongoing hemoptysis  Pt is currently in no position to have IVC filter placed Monitor for further bleeding, volume  A: Left lung consolidation, cavitation, bronchopleural fistula - culture negative pleural fluid from IR chest tube 2/1 P: Vancomycin + Zosyn as above  Follow cultures, PCT  Not a candidate for FOB NPO due to increased WOB > ok to eat if stabilized per SLP   A: Acute on Hypoxic Respiratory Failure - in setting of PE, L lung consolidation  P: O2 to support sats 88-95% Consider morphine for increased work of breathing / air hunger Pulmonary hygiene as able   A: Pulmonary HTN - PA Peak 56  P: Supportive care O2 as above  GOC - DNR.     Canary Brim, NP-C  Pulmonary & Critical Care Pgr: [phone] or if no answer [phone] 02/25/2017, 12:58 PM

## 2017-03-21 NOTE — Progress Notes (Signed)
Initial Nutrition Assessment  DOCUMENTATION CODES:   Non-severe (moderate) malnutrition in context of chronic illness  INTERVENTION:    Ensure Enlive po BID, each supplement provides 350 kcal and 20 grams of protein  30 ml Prostat TID, each supplement provides 100 kcals and 15 grams protein.   Provide MVI daily  NUTRITION DIAGNOSIS:   Moderate Malnutrition related to chronic illness(CHF) as evidenced by moderate fat depletion, moderate muscle depletion.  GOAL:   Patient will meet greater than or equal to 90% of their needs  MONITOR:   PO intake, Supplement acceptance, Diet advancement, Weight trends, Labs, I & O's  REASON FOR ASSESSMENT:   Malnutrition Screening Tool    ASSESSMENT:   Pt with PMH significant for HTN, CHF, DVT/PE, and PAF. Recently admitted at [facility] for bilateral DVTs.  Presented to [facility] with complaints of rectal bleeding and intermittent coughing of blood. Was transferred to [facility] and admitted for pulmonary embolism and caseating lung lesion.    Pt having hard time breathing upon assessment. Unable to obtain thorough nutrition history. Pt reports intake has been poor since [facility] admission 1/22. Unable to provide dietary recall. His last follow up note from clinical nutrition on 2/5 states pt's appetite was fair to poor consuming 0-50% at each meal. Suspect appetite has not improved since. Pt was noted to enjoy Ensure, will continue with them. SLP saw pt 2/17. Pt was found to have respiratory based dysphagia and was place on pureed diet with thin liquids. Records show pt refused breakfast this morning.   Records indicate pt weighed 180 lb 11/16 and 151 lb this admission. Unable to determine how much is dry weight loss vs fluid loss given pt's history of CHF. Suspect pt has lost significant amount of dry weight. Nutrition-Focused physical exam completed.   Medications reviewed and include: 20 mg lasix, carafate, IV abx Labs reviewed.  NUTRITION - FOCUSED  PHYSICAL EXAM:    Most Recent Value  Orbital Region  Moderate depletion  Upper Arm Region  Severe depletion  Thoracic and Lumbar Region  Unable to assess  Buccal Region  Moderate depletion  Temple Region  Mild depletion  Clavicle Bone Region  Moderate depletion  Clavicle and Acromion Bone Region  Moderate depletion  Scapular Bone Region  Unable to assess  Dorsal Hand  Mild depletion  Patellar Region  Moderate depletion  Anterior Thigh Region  Moderate depletion  Posterior Calf Region  Moderate depletion  Edema (RD Assessment)  None     Diet Order:  DIET - DYS 1 Room service appropriate? Yes; Fluid consistency: Thin  EDUCATION NEEDS:   Not appropriate for education at this time  Skin:  Skin Assessment: Reviewed RN Assessment  Last BM:  14-Mar-2017  Height:   Ht Readings from Last 1 Encounters:  14-Mar-2017 5\' 10"  (1.778 m)    Weight:   Wt Readings from Last 1 Encounters:  02/27/2017 151 lb 3.8 oz (68.6 kg)    Ideal Body Weight:     BMI:  Body mass index is 21.7 kg/m.  Estimated Nutritional Needs:   Kcal:  2000-2200 kcal/day  Protein:  100-110 g/day  Fluid:  >2 L/day    Vanessa Kick RD, LDN Clinical Nutrition Pager # [phone]

## 2017-03-21 NOTE — Progress Notes (Signed)
Pt's son Stephen Fleming was bedside when I arrived and he was about to leave. He said other family members left because that could not take it. He was appropriately tearful and accepting. He said of his dad that he is in a better place. They would like [facility] in [place] to be contacted for pt remains.  Elmarie Shiley [place], MDiv   03-23-2017 1800  Clinical Encounter Type  Visited With Family

## 2017-03-21 NOTE — Consult Note (Signed)
Cardiology Consultation:   Patient ID: Stephen Fleming; [id number]; Sep 25, 1955   Admit date: 2017-03-25 Date of Consult: 03/13/2017  Primary Care Provider: Louie Boston., MD Primary Cardiologist: Charlton Haws, MD  Primary Electrophysiologist:  NA   Patient Profile:   Stephen Fleming is a 62 y.o. male with a hx of combined systolic/diastolic HF, CAD (cath in 11/2016 showing60% first diagonal, 95% ostial small OM1, 70% OM2, and 50% mid PDA--> medical management recommended, moderate MR, HTN, OSA intolerant CPAP, Bil, DVT and PE 01/2017 and Rt atrial thrombus who is being seen today for the evaluation of atrial fib at the request of Dr. Thedore Mins.  History of Present Illness:   Mr. Stephen Fleming a hx of combined systolic/diastolic HF, CAD (cath in 11/2016 showing60% first diagonal, 95% ostial small OM1, 70% OM2, and 50% mid PDA--> medical management recommended, moderate MR, HTN, OSA intolerant CPAP, Bil, DVT and submassive PE 01/2017 and Rt atrial thrombus discharged 02/26/17.  He did have PAF with RVR on that admit.  Returned to [facility] on amiodarone and Dig.   EF was 15%, pt was placed on Eliquis at discharge.  He did have a GI bleed but cleared for Eliquis by GI at discharge.     He is now admitted for hemoptysis from [facility] admitted there 03/03/17), rectal bleeding, bleeding from foley and LL quad pain.    Pt transfused, 1 UPRBCs, anticoagulation was held,  CT scan of the abdomen in the emergency room showed signs of pyelonephritis and signs of a developing pneumonia in the right middle and lower lobes suspected to be due to aspiration as patient has had issues with dysphasia during his previous hospital admissions.   GI evaluation with colonoscopy and EGD on 2/14, and bleed symptoms and it was thought to likely be diverticular in nature.  However, patient was reported to have developed hemoptysis again on 2/16.  A CT scan was performed showing multiple lung lesions consistent with new pulmonary emboli and  reported caseating lesion.   Also recurrent PAF with RVR and on IV dilt.  Transferred to [facility] 25-Mar-2017.   Per IM  "After more research it was found the patient did not have new blood clots on the CT scan done on 03/08/2017 at [facility], also son reveals that he has been minimally responsive for the last 1 week at that facility."    EKG I personally reviewed  03-25-17 with SR LBBB old and LAD + PVCs  Today K+ 3.7 up from 2.7 yesterday, Mg+ 1.7 on admit here Cr 0.79, BUN 16 LFTs normal BNP on admit 4,085 Troponin 0.09, 0.07 Hgb 11.9, Hct 37.4 WBC 16.9  TSH 2.80 CT head, no acute process  PCXR today  IMPRESSION: 1. No change in left hydropneumothorax and left more than right airspace disease. 2. Cardiomegaly   Currently he is SOB, wanting to go home for 2 days,  He wants to see his dogs.    Past Medical History:  Diagnosis Date  . Arthritis    "probably in all my joints" (12/03/2016)  . Asthma   . CAD (coronary artery disease)    a. LHC 12/04/16: 30% RCA, 50% RPDA, 25% mid LAD, 95% 1st diag, 70% 2nd diag.  . CHF (congestive heart failure), NYHA class II, chronic, combined (HCC)    12/04/16: Echo EF 20-25%, Gr 3 DD, PA peak pressure 62 mmgHg, RHC: PCPW 29 mmHg  . Eczema   . Gout    "I take RX for it" (12/03/2016)  .  Heart murmur   . Hypertension   . Mitral regurgitation    12/04/16: moderate by echo  . OSA (obstructive sleep apnea)    "never could tolerate the mask" (12/03/2016)  . Pulmonary hypertension (HCC)    12/04/16: RHC: Mod PHTN, PAP 37 mmHg    Past Surgical History:  Procedure Laterality Date  . APPENDECTOMY  ~  1967  . BACK SURGERY    . COLONOSCOPY  ~ 2008  . ESOPHAGOGASTRODUODENOSCOPY  ~ 2008  . INGUINAL HERNIA REPAIR Right ~ 1976  . LUMBAR DISC SURGERY  1991; 1993   "[name] did them both"  . RIGHT/LEFT HEART CATH AND CORONARY ANGIOGRAPHY N/A 12/04/2016   Procedure: RIGHT/LEFT HEART CATH AND CORONARY ANGIOGRAPHY;  Surgeon: [place], Lynessa Almanzar M, MD;   Location: [facility] INVASIVE CV LAB;  Service: Cardiovascular;  Laterality: N/A;     Home Medications:  Prior to Admission medications   Medication Sig Start Date End Date Taking? Authorizing Provider  albuterol (PROVENTIL HFA;VENTOLIN HFA) 108 (90 BASE) MCG/ACT inhaler Inhale 2 puffs into the lungs every 6 (six) hours as needed for wheezing.   Yes [provider]  amiodarone (PACERONE) 200 MG tablet Take 1 tablet (200 mg total) by mouth daily. 02/26/17  Yes Regalado, Belkys A, MD  ampicillin-sulbactam (UNASYN) IVPB Inject 3 g into the vein every 6 (six) hours.   Yes [provider]  apixaban (ELIQUIS) 5 MG TABS tablet Take 1 tablet (5 mg total) by mouth 2 (two) times daily. 03/03/17  Yes Regalado, Belkys A, MD  atorvastatin (LIPITOR) 40 MG tablet Take 1 tablet (40 mg total) by mouth daily at 6 PM. 02/26/17  Yes Regalado, Belkys A, MD  carvedilol (COREG) 3.125 MG tablet Take 1 tablet (3.125 mg total) by mouth 2 (two) times daily with a meal. 02/26/17  Yes Regalado, Belkys A, MD  digoxin (LANOXIN) 0.125 MG tablet Take 1 tablet (0.125 mg total) by mouth daily. 02/26/17  Yes Regalado, Belkys A, MD  feeding supplement, ENSURE ENLIVE, (ENSURE ENLIVE) LIQD Take 237 mLs by mouth 4 (four) times daily. 02/26/17  Yes Regalado, Belkys A, MD  Fluticasone-Salmeterol (ADVAIR) 250-50 MCG/DOSE AEPB Inhale 1 puff into the lungs every 12 (twelve) hours.   Yes [provider]  folic acid (FOLVITE) 1 MG tablet Take 1 tablet (1 mg total) by mouth daily. 02/26/17  Yes Regalado, Belkys A, MD  furosemide (LASIX) 40 MG tablet Take 1 tablet (40 mg total) by mouth daily. 02/26/17  Yes Regalado, Belkys A, MD  hydrocortisone (ANUSOL-HC) 25 MG suppository Place 1 suppository (25 mg total) rectally 2 (two) times daily as needed for hemorrhoids or anal itching. 02/26/17  Yes Regalado, Belkys A, MD  losartan (COZAAR) 25 MG tablet Take 0.5 tablets (12.5 mg total) by mouth 2 (two) times daily. 02/26/17  Yes Regalado, Belkys A, MD    metoprolol succinate (TOPROL-XL) 25 MG 24 hr tablet Take 25 mg by mouth daily.   Yes [provider]  morphine 10 MG/5ML solution Take 1 mg by mouth every 6 (six) hours as needed for severe pain.   Yes [provider]  Multiple Vitamin (MULTIVITAMIN WITH MINERALS) TABS tablet Take 1 tablet by mouth daily. 02/26/17  Yes Regalado, Belkys A, MD  pantoprazole (PROTONIX) 40 MG tablet Take 40 mg by mouth daily.   Yes [provider]  polyethylene glycol powder (GLYCOLAX/MIRALAX) powder Take 17 g daily by mouth. 08/31/16  Yes [provider]  potassium chloride SA (K-DUR,KLOR-CON) 20 MEQ tablet Take  20 mEq by mouth daily.   Yes [provider]  QUEtiapine (SEROQUEL) 25 MG tablet Take 1 tablet (25 mg total) by mouth at bedtime. 02/26/17  Yes Regalado, Belkys A, MD  spironolactone (ALDACTONE) 25 MG tablet Take 1 tablet (25 mg total) by mouth daily. 02/26/17  Yes Regalado, Belkys A, MD  sucralfate (CARAFATE) 1 g tablet Take 1 g by mouth 4 (four) times daily -  with meals and at bedtime.   Yes [provider]  thiamine 100 MG tablet Take 1 tablet (100 mg total) by mouth daily. 02/26/17  Yes Regalado, Belkys A, MD  traMADol (ULTRAM) 50 MG tablet Take 0.5 tablets (25 mg total) by mouth every 12 (twelve) hours as needed for moderate pain or severe pain. 02/26/17  Yes Regalado, Belkys A, MD  apixaban (ELIQUIS) 5 MG TABS tablet Take 2 tablets (10 mg total) by mouth 2 (two) times daily. Patient not taking: Reported on April 05, 2017 02/26/17   Hartley Barefoot A, MD  Vitamin D, Ergocalciferol, (DRISDOL) 50000 units CAPS capsule Take 1 capsule (50,000 Units total) by mouth every 7 (seven) days. Patient not taking: Reported on 05-Apr-2017 03/04/17   Alba Cory, MD    Inpatient Medications: Scheduled Meds: . amiodarone  200 mg Oral Daily  . atorvastatin  40 mg Oral q1800  . [START ON 03/11/2017] carvedilol  3.125 mg Oral BID WC  . Chlorhexidine Gluconate Cloth  6 each  Topical Q0600  . digoxin  0.125 mg Oral Daily  . feeding supplement (ENSURE ENLIVE)  237 mL Oral QID  . feeding supplement (PRO-STAT SUGAR FREE 64)  30 mL Oral TID WC  . furosemide  20 mg Intravenous Daily  . mometasone-formoterol  2 puff Inhalation BID  . mupirocin ointment  1 application Nasal BID  . polyethylene glycol  17 g Oral Daily  . sucralfate  1 g Oral TID WC & HS   Continuous Infusions: . dextrose 5 % with KCl 20 mEq / L 20 mEq (Apr 05, 2017 1356)  . heparin 1,000 Units/hr (02/26/2017 0023)  . piperacillin-tazobactam (ZOSYN)  IV Stopped (03/02/2017 0610)  . vancomycin Stopped (03/07/2017 0024)   PRN Meds: acetaminophen **OR** acetaminophen, ipratropium-albuterol, [DISCONTINUED] ondansetron **OR** ondansetron (ZOFRAN) IV, zolpidem  Allergies:   No Known Allergies  Social History:   Social History   Socioeconomic History  . Marital status: Married    Spouse name: Not on file  . Number of children: Not on file  . Years of education: Not on file  . Highest education level: Not on file  Social Needs  . Financial resource strain: Not on file  . Food insecurity - worry: Not on file  . Food insecurity - inability: Not on file  . Transportation needs - medical: Not on file  . Transportation needs - non-medical: Not on file  Occupational History  . Not on file  Tobacco Use  . Smoking status: Never Smoker  . Smokeless tobacco: Never Used  Substance and Sexual Activity  . Alcohol use: Yes    Alcohol/week: 12.0 oz    Types: 20 Cans of beer per week  . Drug use: No  . Sexual activity: Not Currently  Other Topics Concern  . Not on file  Social History Narrative  . Not on file    Family History:    Family History  Problem Relation Age of Onset  . Heart attack Mother   . CAD Father   . Heart attack Brother      ROS:  Please see the history of present illness.  General:no colds or fevers, no weight changes Skin:no rashes or ulcers HEENT:no blurred vision, no  congestion CV:see HPI PUL:see HPI GI:no diarrhea constipation or melena, no indigestion GU:no hematuria, no dysuria MS:no joint pain, no claudication Neuro:no syncope, no lightheadedness Endo:no diabetes, no thyroid disease  All other ROS reviewed and negative.     Physical Exam/Data:   Vitals:   2017/03/18 0100 03/18/17 0200 03-18-2017 0400 2017/03/18 0500  BP:  98/73 93/70   Pulse: 96 (!) 103 (!) 101   Resp: (!) 36 (!) 41 (!) 21   Temp:      TempSrc:      SpO2: 100% 98% 97%   Weight:    151 lb 3.8 oz (68.6 kg)  Height:        Intake/Output Summary (Last 24 hours) at 2017/03/18 0806 Last data filed at 03/18/2017 0500 Gross per 24 hour  Intake 881.17 ml  Output 1275 ml  Net -393.83 ml   Filed Weights   02/26/2017 0519 03/18/2017 0500  Weight: 151 lb 10.8 oz (68.8 kg) 151 lb 3.8 oz (68.6 kg)   Body mass index is 21.7 kg/m.  General:  Thin frail male, SOB HEENT: normal Lymph: no adenopathy Neck: + JVD Endocrine:  No thryomegaly Vascular: No carotid bruits;2+ pedal bil. Cardiac:  normal S1, S2; RRR; no murmur gallup rub or click Lungs:  Bilateral breath sounds to auscultation bilaterally, no wheezing, rhonchi or rales but very diminished Abd: soft, nontender, no hepatomegaly  Ext: + edema of ankles Musculoskeletal:  No deformities, BUE and BLE strength normal and equal Skin: warm and dry  Neuro:  Alert wants to go home follows commands Psych:  flat affect    Telemetry:  Telemetry was personally reviewed and demonstrates:  SR with LBBB and PVCs  Relevant CV Studies: Cardiac MRI  02/26/17 IMPRESSION: 1. Moderately dilated LV with EF 17%, diffuse hypokinesis with septal-lateral dyssynchrony.  2.  Mildly dilated RV with mildly decreased systolic function.  3.  Moderate MR.  4. Coronary-type LGE pattern in the anteroseptal wall. Noncoronary LGE at inferoseptal RV insertion site, nonspecific and can may suggest RV volume overload.   Echo 02/11/17 Study  Conclusions  - Left ventricle: The cavity size was moderately dilated. Wall   thickness was normal. The estimated ejection fraction was in the   range of 10% to 15%. Diffuse hypokinesis. The study is not   technically sufficient to allow evaluation of LV diastolic   function. - Regional wall motion abnormality: Akinesis of the basal-mid   anteroseptal myocardium. - Aortic valve: There was mild regurgitation. Valve area (VTI):   3.88 cm^2. Valve area (Vmax): 3.22 cm^2. Valve area (Vmean): 3.23   cm^2. - Mitral valve: Mildly calcified annulus. There was mild   regurgitation. - Left atrium: The atrium was severely dilated. - Right ventricle: The ventricular septum is flattend in systole   and diastole consistent with RV pressure and volume overload.   Systolic function was moderately reduced. TAPSE: 12.6 mm . - Right atrium: There is a semimobile linear structure in the right   atrium that likely represents a thrombus. The atrium was   moderately dilated. - Atrial septum: No defect or patent foramen ovale was identified. - Tricuspid valve: There was moderate regurgitation. - Pulmonary arteries: Systolic pressure was moderately increased.   PA peak pressure: 46 mm Hg (S). - Inferior vena cava: The vessel was dilated. The respirophasic   diameter changes were blunted (<  50%), consistent with elevated   central venous pressure. - Technically adequate study.  ECHO 03/16/2017  Study Conclusions  - Left ventricle: The cavity size was moderately dilated. Wall   thickness was increased in a pattern of mild LVH. Systolic   function was severely reduced. The estimated ejection fraction   was in the range of 10% to 15%. Diffuse hypokinesis. Features are   consistent with a pseudonormal left ventricular filling pattern,   with concomitant abnormal relaxation and increased filling   pressure (grade 2 diastolic dysfunction). - Aortic valve: Transvalvular velocity was within the normal range.    There was no stenosis. There was mild to moderate regurgitation.   Regurgitation pressure half-time: 427 ms. - Mitral valve: Calcified annulus. Transvalvular velocity was   within the normal range. There was no evidence for stenosis.   There was moderate regurgitation directed posteriorly. - Left atrium: The atrium was moderately dilated. - Right ventricle: The cavity size was mildly dilated. Wall   thickness was normal. Systolic function was normal. - Right atrium: The atrium was mildly dilated. - Tricuspid valve: There was moderate regurgitation. - Pulmonary arteries: Systolic pressure was moderately increased.   PA peak pressure: 56 mm Hg (S).   Laboratory Data:  Chemistry Recent Labs  Lab 03/16/2017 0640 11-Mar-2017 0512  NA 144 137  K 2.7* 3.7  CL 107 101  CO2 29 24  GLUCOSE 119* 128*  BUN 12 16  CREATININE 0.67 0.79  CALCIUM 7.8* 7.8*  GFRNONAA >60 >60  GFRAA >60 >60  ANIONGAP 8 12    Recent Labs  Lab 02/21/2017 0640  PROT 6.5  ALBUMIN 2.1*  AST 41  ALT 32  ALKPHOS 77  BILITOT 1.4*   Hematology Recent Labs  Lab 02/27/2017 0640  02/27/2017 1717 03/17/2017 2137 2017-03-11 0512  WBC 13.5*  --   --   --  16.9*  RBC 3.90*  --   --   --  3.99*  HGB 11.9*   < > 12.5* 12.9* 11.9*  HCT 36.6*   < > 39.7 40.0 37.4*  MCV 93.8  --   --   --  93.7  MCH 30.5  --   --   --  29.8  MCHC 32.5  --   --   --  31.8  RDW 16.2*  --   --   --  16.0*  PLT 227  --   --   --  273   < > = values in this interval not displayed.   Cardiac Enzymes Recent Labs  Lab 03/03/2017 0640 03/04/2017 0934  TROPONINI 0.09* 0.07*   No results for input(s): TROPIPOC in the last 168 hours.  BNP Recent Labs  Lab 02/25/2017 0640  BNP 4,085.6*    DDimer No results for input(s): DDIMER in the last 168 hours.  Radiology/Studies:  Ct Head Wo Contrast  Result Date: 03/09/2017 CLINICAL DATA:  Altered level of consciousness. EXAM: CT HEAD WITHOUT CONTRAST TECHNIQUE: Contiguous axial images were obtained  from the base of the skull through the vertex without intravenous contrast. COMPARISON:  02/22/2017 FINDINGS: Brain: No evidence of acute infarction, hemorrhage, hydrocephalus, extra-axial collection or mass lesion/mass effect. Prominence of the sulci and ventricles identified compatible with brain atrophy. Vascular: No hyperdense vessel or unexpected calcification. Skull: Normal. Negative for fracture or focal lesion. Sinuses/Orbits: No acute finding. Other: None. IMPRESSION: 1. No acute intracranial abnormalities. 2. Mild brain atrophy. Electronically Signed   By: Signa Kell M.D.   On: 03/09/2017  10:41   Dg Chest Port 1 View  Result Date: 03/15/2017 CLINICAL DATA:  Shortness of breath EXAM: PORTABLE CHEST 1 VIEW COMPARISON:  CT from 2 days ago FINDINGS: Cardiomegaly. Moderate left hydropneumothorax, likely loculated, without convincing change from 2 days prior when allowing for differences in positioning. Mild airspace opacity at the right base. No Kerley lines where the lung is visible. No change from prior. IMPRESSION: 1. No change in left hydropneumothorax and left more than right airspace disease. 2. Cardiomegaly. Electronically Signed   By: Marnee Spring M.D.   On: 2017-03-15 07:23    Assessment and Plan:   1. Paroxysmal to persistent a fib, now SR on IV lopressor, his amiodarone has been on hold and dig-- with low EF would prefer amiodarone.  Resume at 200 mg daily.  Check dig level but resume at 0.125 daily if Dr. Eden Emms agrees. For CM more than a fib  2.        Bil. DVT/PE -Eliquis recently stopped due to GI bleed and hemoptysis.  Now on IV heparin.  Cavitary lung lesion on the right side on CT scan done on 03/08/2017 at [facility].  Could be post infarct lung but could be also due to ongoing dysphagia and aspiration, he has had issues with swallowing food at [facility].  For now placed on vancomycin and Zosyn since he had leukocytosis  CCM to see --Thrombus in transit seen in RA on  echo.   3.    Recent GI bleed  recent EGD colonoscopy at [facility] unremarkable, this was affected to be diverticular bleed in the setting of Eliquis, mild hemoptysis likely due to cavitary lung lesion versus lung infarct due to large PE.  HGB stable, did receive 1 UPRBCs at [facility].   4.    Cardiomyopathy Non ischemic cardiomyopathy out of proportion to degree of CAD, was seen by Dr. Shirlee Latch with HF on last admit and IV Milronie started for low output.  Dig was started then.  Cardiac MRI done see above.   --with wide LBBB -->CRT may be beneficial down the road ( may be related to ETOG use but stopped in 11/2017) --was on coreg, bp in 90s systolic  --? Need for IV milrinone, HF consult  5.    CAD Patient had extensive CAD on 11/18 cath but primarily nonobstructive.  Does not explain cardiomyopathy (cath in 11/2016 showing60% first diagonal, 95% ostial small OM1, 70% OM2, and 50% mid PDA--> medical management recommended)  6.    Moderate MR; mild to mod AR; mod TR  7.    Pul. HTN with PA pk pressure 56 mmHg.     8.    Systolic and diastolic HF   9.    Chronic LBBB  10.   Metabolic and toxic encephalopathy with delirium per IM        For questions or updates, please contact [facility] Please consult [email or web address] for contact info under Cardiology/STEMI.   Signed, Nada Boozer, NP  03-15-17 8:06 AM   Patient examined chart reviewed. Miserable white male looks older than stated age Marked rhonchi and coughing with thick sputum and hemoptysis. Post multiple PE;s With pulmonary infarction , pneumonia, pneumothorax History of essentially non ischemic DCM with cath 11/2016 small vessel disease Rx medically. Plus one LE edema. No murmur  Patient should be palliative care and go home on home hospice He just wants to go home and see his dogs. I don't see his lung situation getting better. Discussed  with daughter who has a hard time dealing with situation Primary service and pulmonary  should spend some time and have family conference move toward home hospice this week  Charlton Haws

## 2017-03-21 NOTE — Progress Notes (Signed)
[facility]  Met with family Respiratory status worsening O2 saturation 52%  Start morphine infusion Explained he will die here in the hospital  Roselie Awkward, MD [facility] Pager: [phone] Cell: [phone] After 3pm or if no response, call [phone]

## 2017-03-21 NOTE — Progress Notes (Signed)
ANTICOAGULATION CONSULT NOTE - Follow Consult  Pharmacy Consult for heparin Indication: PE/DVT  No Known Allergies  Patient Measurements: Height: 5\' 10"  (177.8 cm) Weight: 151 lb 3.8 oz (68.6 kg) IBW/kg (Calculated) : 73 Heparin Dosing Weight:   Vital Signs: Temp: 97.4 F (36.3 C) (02/18 0000) Temp Source: Oral (02/18 0000) BP: 93/70 (02/18 0400) Pulse Rate: 101 (02/18 0400)  Labs: Recent Labs    April 01, 2017 0640 2017-04-01 0934 2017/04/01 1153 04-01-2017 1717 2017-04-01 1832 04-01-2017 2137 02/27/2017 0512  HGB 11.9* 11.9*  --  12.5*  --  12.9* 11.9*  HCT 36.6* 36.7*  --  39.7  --  40.0 37.4*  PLT 227  --   --   --   --   --  273  APTT 69*  --   --   --   --   --   --   LABPROT 19.9*  --   --   --   --   --   --   INR 1.70  --   --   --   --   --   --   HEPARINUNFRC  --   --  0.36  --  0.28*  --  0.36  CREATININE 0.67  --   --   --   --   --  0.79  TROPONINI 0.09* 0.07*  --   --   --   --   --     Estimated Creatinine Clearance: 91.7 mL/min (by C-G formula based on SCr of 0.79 mg/dL).   Medical History: Past Medical History:  Diagnosis Date  . Arthritis    "probably in all my joints" (12/03/2016)  . Asthma   . CAD (coronary artery disease)    a. LHC 12/04/16: 30% RCA, 50% RPDA, 25% mid LAD, 95% 1st diag, 70% 2nd diag.  . CHF (congestive heart failure), NYHA class II, chronic, combined (HCC)    12/04/16: Echo EF 20-25%, Gr 3 DD, PA peak pressure 62 mmgHg, RHC: PCPW 29 mmHg  . Eczema   . Gout    "I take RX for it" (12/03/2016)  . Heart murmur   . Hypertension   . Mitral regurgitation    12/04/16: moderate by echo  . OSA (obstructive sleep apnea)    "never could tolerate the mask" (12/03/2016)  . Pulmonary hypertension (HCC)    12/04/16: RHC: Mod PHTN, PAP 37 mmHg    Medications:  Infusions:  . dextrose 5 % with KCl 20 mEq / L 20 mEq (01-Apr-2017 1356)  . heparin 1,000 Units/hr (03/16/2017 0023)  . piperacillin-tazobactam (ZOSYN)  IV Stopped (03/12/2017 0610)  .  vancomycin Stopped (02/28/2017 0024)    Assessment: [name] admitted to [facility] on 2/11 with melanotic stools. Was on Eliquis for recently diagnosed PE/DVT. Received Kcentra there. EGD and colonoscopy did not show any acute lesions. While off anticoagulation he develeped new pulmonary emboli and a caseating lung lesion was discovered. He was started on heparin and transferred to [facility] for pulmonary consult. Patient arrived on a heparin infusion at 1000units/hr.  Today, 02/21/2017: - Heparin level is 0.36 in the therapeutic range on 1000units/hr. - CBC this AM was ok. Hgb 11.9, plt 273 - RN reports patient coughed up some dark red mucus. Per RN pt had been coughing up the mucus prior to heparin starting. Will continue to monitor.  Goal of Therapy:  Heparin level 0.3-0.7 units/ml Monitor platelets by anticoagulation protocol: Yes   Plan:  - Continue heparin at  1000units/hr - Heparin level and CBC daily - Monitor for signs and symptoms of bleeding - Follow for ability to resume oral anticoagulation   Adalberto Cole, PharmD, BCPS Pager [phone] 03/07/2017 8:07 AM

## 2017-03-21 NOTE — Progress Notes (Signed)
Significant concern for aspiration w/ any PO intake. Pt has difficulty with water and marked difficulty w/ PO pills. Attempted to evaluate ability to swallow pills w/ applesauce; pt refused and demanded to take pills w/ water. Has amiodarone and digoxin scheduled at 1000. Spoke w/ cardiologist regarding concern for aspiration; He stated okay to hold medications given concern. Paged speech therapy for possible re-evaluation. Will continue to monitor closely.

## 2017-03-21 NOTE — Progress Notes (Addendum)
@IPLOG @        PROGRESS NOTE                                                                                                                                                                                                             Patient Demographics:    Stephen Fleming, is a 62 y.o. male, DOB - 06-26-1955, QVZ:[id number]  Admit date - 03/14/2017   Admitting Physician Rometta Emery, MD  Outpatient Primary MD for the patient is Louie Boston., MD  LOS - 1  No chief complaint on file.      Brief Narrative   Stephen Fleming is a 62 y.o. 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    Patient had originally been seen at [facility] on 1/21, with bilateral leg pain, hemoptysis, severe dyspnea on exertion, found to have bilateral LE DVT's and acute submassive PE on CTA with RV/LV of 1, right heart strain noted on TTE. He was placed on heparin, somewhat hypotensive and was transferred to [facility] for consideration of thrombolytics. No thrombolytics were indicated. Heart failure team was consulted for acute systolic LV and right heart failure.He experienced PAF with RVR since returning to [facility] on amiodarone.   On admission at [facility] on 2/11, patient was initially noted to be hypoxic with O2 saturations 85% on room air initially and blood pressures borderline hypotensive.  Examination revealed melanotic stools that were guaiac positive.  Hemoglobin at that time was noted to be 11.7.  Patient also had blood in his Foley catheter and a stage I decubitus ulcer noted.  Due to his symptoms it was felt the bleeding was related to the effects  of anticoagulation in case syndrome was given as well as 1 unit of blood.  Repeat hemoglobin was noted to remain at 11 on admission.  CT scan of the abdomen in the emergency room showed signs of pyelonephritis and signs of a developing pneumonia in the right middle and lower lobes suspected to be due to aspiration as patient has had issues with dysphasia during his previous hospital admissions.  During this period in time patient remains off anticoagulation and was given empiric antibiotics.  GI bleed symptoms resolved as well as hemoptysis.  He was evaluated by GI with colonoscopy and EGD on 2/14, and bleed symptoms and it was thought to likely be diverticular in nature.  However, patient was reported to have developed hemoptysis again on 2/16.  A CT scan was performed showing multiple lung lesions consistent with new pulmonary emboli and reported caseating lesion.  He also was noted to have gone into A. fib with RVR for which he was placed on a Cardizem drip with improvement of heart rates.  No reports of repeat GI bleed noted.  Transfer was accepted to stepdown bed at [facility].  After more research it was found the patient did not have new blood clots on the CT scan done on 03/08/2017 at [facility], also son reveals that he has been minimally responsive for the last 1 week at that facility.   Subjective:   Patient in bed, appears to be in no distress, he is more awake today but still confused, denies any headache chest or abdominal pain, does agree to mild shortness of breath.   Assessment  & Plan :     1.  Chronic atrial fibrillation with RVR.  [place] vas 2 score of at least 3.  Initially required Cardizem drip, will avoid further Cardizem due to depressed EF, currently rate controlled, since oral status is tenuous have placed him on scheduled low-dose scheduled and as needed Lopressor, 1 dose of IV digoxin was given on March 22, 2017, resume oral Lopressor, amiodarone and digoxin combination.  If rate  control becomes challenging we might have to consider amiodarone in the light of his soft blood pressures, heparin if he is able to tolerate, Eliquis on hold due to bleeding below.  Cardiology on board.     2.  History of DVT PE.  Less than 1 month ago, no new clots on CT scan done at  [facility] on 03/08/2017, currently on heparin drip, was on Eliquis prior to this.  On 03/06/2017 some hemoptysis again noted in ICU, hold heparin, will discuss with pulmonary if he needs IVC filter or if it safer to wait and watch for 24 hours.  Lower extremity venous duplex ordered to look at clot burden.  3. HX of GI bleed and possible mild hemoptysis.  Per family recent EGD colonoscopy at [facility] unremarkable, this was affected to be diverticular bleed in the setting of Eliquis, mild hemoptysis likely due to cavitary lung lesion versus lung infarct due to large PE.  Type screen done.  Anticoagulation as above.  [facility] stable.  4.  Cavitary lung lesion on the right side on CT scan done on 03/08/2017 at [facility], intermittent hemoptysis.  Could be post infarct lung but could be also due to ongoing dysphagia and aspiration, he has had issues with swallowing food at [facility].  For now placed on vancomycin and Zosyn since he had leukocytosis, n.p.o. till mentation is better, speech to evaluate.  We will continue to monitor PCCM on board.  5.  Metabolic and toxic encephalopathy with delirium.  Supportive care, CT head done on 03/22/2017 shows no acute infarct.  6.  CAD with ischemic +/- Alcoholic cardiomyopathy and chronic combined systolic and diastolic CHF with last EF 10% a few weeks ago.  Supportive care, avoid calcium channel blocker, beta-blocker ACE inhibitor and Lasix once blood pressure allows.  Cardiology consulted.  Currently troponin trend is stable and likely mild rise due to demand ischemia from #1 above.  Low-dose Lasix as tolerated by blood pressure for now.  Currently does not appear to be in  frank CHF.  Due to bleeding will avoid aspirin, statin, oral beta-blocker, ACE inhibitor, oral Lasix once mentation improves and blood pressure is better.  Repeat echocardiogram noted.  7.  Recent possible diverticular bleed.  Currently stable with monitor.  8.  Hypokalemia.  Replaced.  Monitor.  9.  History of mural thrombus on last echocardiogram several weeks ago.  Was on anticoagulation, not noted on recent echocardiogram this admission.  10.  Remote history of alcohol abuse.  No acute issue.    Addendum around 1 PM patient had an episode of hemoptysis after which he became more short of breath and cyanotic, fortunately at that time critical care was seeing the patient, patient was known to Dr. Kendrick Fries from his previous hospitalization.  Dr. Kendrick Fries discussed his poor prognosis with family in detail, at this time he has been transitioned to morphine drip.  Expect him to pass away soon.  Family in agreement with the plan.    Diet : DIET - DYS 1 Room service appropriate? Yes; Fluid consistency: Thin    Family Communication  : Wife, sons and home family bedside on 03/28/2017, told in detail that his long-term prognosis is not good, told them to be prepared for an adverse outcome as it happened this admission.  Code Status : DNR  Disposition Plan  : Stay in PCU  Consults  : Pulmonary, cardiology  Procedures  :    CT head.  Nonacute.  Echocardiogram - - Left ventricle: The cavity size was moderately dilated. Wall  thickness was increased in a pattern of mild LVH. Systolic function was severely reduced. The estimated ejection fraction was in the range of 10% to 15%. Diffuse hypokinesis. Features are consistent with a pseudonormal left ventricular filling pattern,  with concomitant abnormal relaxation and increased filling pressure (grade 2 diastolic dysfunction). - Aortic valve: Transvalvular velocity was within the normal range. There was no stenosis. There was mild to moderate  regurgitation. Regurgitation pressure half-time: 427 ms. - Mitral valve: Calcified annulus. Transvalvular velocity was  within the normal range. There was no evidence for stenosis. There was moderate regurgitation directed posteriorly. - Left atrium: The atrium was moderately dilated. - Right ventricle: The cavity size was mildly dilated. Wall  thickness was normal. Systolic function was normal. - Right atrium: The atrium was mildly dilated. - Tricuspid valve: There was moderate regurgitation. - Pulmonary arteries: Systolic pressure was moderately increased.  PA peak pressure: 56 mm Hg (S).   Lower extremity venous duplex    DVT Prophylaxis  :    Heparin    Lab Results  Component Value Date   PLT 273 02/27/2017    Inpatient Medications  Scheduled Meds: . amiodarone  200 mg Oral Daily  . atorvastatin  40 mg Oral q1800  . [START ON 03/11/2017] carvedilol  3.125 mg Oral BID WC  . Chlorhexidine Gluconate Cloth  6 each Topical Q0600  . digoxin  0.125 mg Oral Daily  . feeding supplement (ENSURE ENLIVE)  237 mL Oral QID  . feeding supplement (PRO-STAT SUGAR FREE 64)  30 mL Oral TID WC  . furosemide  20 mg Intravenous Daily  . haloperidol lactate  2 mg Intravenous Once  . mometasone-formoterol  2 puff Inhalation BID  . mupirocin ointment  1 application Nasal BID  . polyethylene glycol  17 g Oral Daily  . QUEtiapine  25 mg Oral BID  . sucralfate  1 g Oral TID WC & HS  Continuous Infusions: . dextrose 5 % with KCl 20 mEq / L 20 mEq (02/21/2017 0941)  . heparin Stopped (03/07/2017 1100)  . piperacillin-tazobactam (ZOSYN)  IV 3.375 g (03/18/2017 1118)  . vancomycin 1,000 mg (02/26/2017 1008)   PRN Meds:.acetaminophen **OR** acetaminophen, ipratropium-albuterol, [DISCONTINUED] ondansetron **OR** ondansetron (ZOFRAN) IV, zolpidem  Antibiotics  :    Anti-infectives (From admission, onward)   Start     Dose/Rate Route Frequency Ordered Stop   March 29, 2017 2200  vancomycin (VANCOCIN) IVPB 1000  mg/200 mL premix     1,000 mg 200 mL/hr over 60 Minutes Intravenous Every 12 hours 03/29/2017 1743     03/29/17 1030  piperacillin-tazobactam (ZOSYN) IVPB 3.375 g     3.375 g 12.5 mL/hr over 240 Minutes Intravenous Every 8 hours 03/29/2017 1014     2017-03-29 1030  vancomycin (VANCOCIN) 1,500 mg in sodium chloride 0.9 % 500 mL IVPB     1,500 mg 250 mL/hr over 120 Minutes Intravenous  Once Mar 29, 2017 1014 29-Mar-2017 1350         Objective:   Vitals:   02/25/2017 0200 03/12/2017 0400 03/15/2017 0500 02/24/2017 0800  BP: 98/73 93/70    Pulse: (!) 103 (!) 101    Resp: (!) 41 (!) 21    Temp:    99.1 F (37.3 C)  TempSrc:    Oral  SpO2: 98% 97%    Weight:   68.6 kg (151 lb 3.8 oz)   Height:        Wt Readings from Last 3 Encounters:  03/01/2017 68.6 kg (151 lb 3.8 oz)  02/26/17 76.2 kg (167 lb 15.9 oz)  01/22/17 80.7 kg (178 lb)     Intake/Output Summary (Last 24 hours) at 03/11/2017 1134 Last data filed at 03/18/2017 0500 Gross per 24 hour  Intake 881.17 ml  Output 1275 ml  Net -393.83 ml     Physical Exam  Awake , less somnolent but still confused, No new F.N deficits, Normal affect [place].AT,PERRAL Supple Neck,No JVD, No cervical lymphadenopathy appriciated.  Symmetrical Chest wall movement, Good air movement bilaterally, CTAB RRR,No Gallops, Rubs or new Murmurs, No Parasternal Heave +ve B.Sounds, Abd Soft, No tenderness, No organomegaly appriciated, No rebound - guarding or rigidity. No Cyanosis, Clubbing or edema, No new Rash or bruise       Data Review:    CBC Recent Labs  Lab 03-29-2017 0640 Mar 29, 2017 0934 03/29/2017 1717 03-29-17 2137 03/08/2017 0512  WBC 13.5*  --   --   --  16.9*  HGB 11.9* 11.9* 12.5* 12.9* 11.9*  HCT 36.6* 36.7* 39.7 40.0 37.4*  PLT 227  --   --   --  273  MCV 93.8  --   --   --  93.7  MCH 30.5  --   --   --  29.8  MCHC 32.5  --   --   --  31.8  RDW 16.2*  --   --   --  16.0*  LYMPHSABS 2.7  --   --   --   --   MONOABS 1.0  --   --   --   --   EOSABS  0.1  --   --   --   --   BASOSABS 0.1  --   --   --   --     Chemistries  Recent Labs  Lab 29-Mar-2017 0640 03/01/2017 0512  NA 144 137  K 2.7* 3.7  CL 107 101  CO2 29 24  GLUCOSE 119* 128*  BUN 12 16  CREATININE 0.67 0.79  CALCIUM 7.8* 7.8*  MG 1.7 1.7  AST 41  --   ALT 32  --   ALKPHOS 77  --   BILITOT 1.4*  --    ------------------------------------------------------------------------------------------------------------------ No results for input(s): CHOL, HDL, LDLCALC, TRIG, CHOLHDL, LDLDIRECT in the last 72 hours.  Lab Results  Component Value Date   HGBA1C 5.4 12/05/2016   ------------------------------------------------------------------------------------------------------------------ Recent Labs    03/02/2017 0934  TSH 2.800   ------------------------------------------------------------------------------------------------------------------ No results for input(s): VITAMINB12, FOLATE, FERRITIN, TIBC, IRON, RETICCTPCT in the last 72 hours.  Coagulation profile Recent Labs  Lab 03/08/2017 0640  INR 1.70    No results for input(s): DDIMER in the last 72 hours.  Cardiac Enzymes Recent Labs  Lab 03/11/2017 0640 02/26/2017 0934  TROPONINI 0.09* 0.07*   ------------------------------------------------------------------------------------------------------------------    Component Value Date/Time   BNP 1,781.4 (H) 03-31-17 0726    Micro Results Recent Results (from the past 240 hour(s))  MRSA PCR Screening     Status: Abnormal   Collection Time: 02/24/2017  4:29 AM  Result Value Ref Range Status   MRSA by PCR POSITIVE (A) NEGATIVE Final    Comment:        The GeneXpert MRSA Assay (FDA approved for NASAL specimens only), is one component of a comprehensive MRSA colonization surveillance program. It is not intended to diagnose MRSA infection nor to guide or monitor treatment for MRSA infections. CRITICAL RESULT CALLED TO, READ BACK BY AND VERIFIED  WITH: S.Susann Givens RN 03/19/2017 0740 JR Performed at [facility], [address] [place], [place] [address]     Radiology Reports   Ct Head Wo Contrast  Result Date: 03/01/2017 CLINICAL DATA:  Altered level of consciousness. EXAM: CT HEAD WITHOUT CONTRAST TECHNIQUE: Contiguous axial images were obtained from the base of the skull through the vertex without intravenous contrast. COMPARISON:  02/22/2017 FINDINGS: Brain: No evidence of acute infarction, hemorrhage, hydrocephalus, extra-axial collection or mass lesion/mass effect. Prominence of the sulci and ventricles identified compatible with brain atrophy. Vascular: No hyperdense vessel or unexpected calcification. Skull: Normal. Negative for fracture or focal lesion. Sinuses/Orbits: No acute finding. Other: None. IMPRESSION: 1. No acute intracranial abnormalities. 2. Mild brain atrophy. Electronically Signed   By: Signa Kell M.D.   On: 03/09/2017 10:41     Time Spent in minutes  30   Susa Raring M.D on 2017-03-31 at 11:34 AM  Between 7am to 7pm - Pager - [phone] ( page via [email or web address], text pages only, please mention full 10 digit call back number). After 7pm go to [email or web address] - password [email or web address]

## 2017-03-21 NOTE — Consult Note (Signed)
PULMONARY / CRITICAL CARE MEDICINE   Name: Stephen Fleming MRN: [id number] DOB: Jul 09, 1955    ADMISSION DATE:  04-02-2017 CONSULTATION DATE:  02-Apr-2017  REFERRING MD: Susa Raring MD  CHIEF COMPLAINT: Pulmonary embolism,   HISTORY OF PRESENT ILLNESS:   62 year old with history of hypertension, severe coronary artery disease, heart failure [EF 10-15%], paroxysmal atrial fibrillation.  He had a recent admission at [facility] and [facility] for bilateral DVT (2/6-2/13), submassive PE.  EKOS were considered but not administered as he was stable.  He developed left lung consolidation thought to be secondary to infarction versus pneumonia, treated with broad-spectrum antibiotics, left chest tube placed by IR on 2/1- 2/4  Admitted to [facility] on 2/11 with melanotic stools (100- 200cc). His hemoglobin did not drop.  EGD, colonoscopy did not show any acute lesion.  He was restarted on anticoagulation and sent to [facility] for further evaluation (2/17).   SUBJECTIVE:  RN reports pt SpO2 difficult to pick up / read.  O2 early in shift on 3L, now on 5-6 L.  Wife went home early in the am to rest.  Pt just given haldol for agitation.  Heparin gtt turned off due to hemoptysis (11am).   VITAL SIGNS: BP 93/70   Pulse (!) 101   Temp 99.1 F (37.3 C) (Oral)   Resp (!) 21   Ht 5\' 10"  (1.778 m)   Wt 151 lb 3.8 oz (68.6 kg)   SpO2 97%   BMI 21.70 kg/m   HEMODYNAMICS:    VENTILATOR SETTINGS:    INTAKE / OUTPUT: I/O last 3 completed shifts: In: 881.2 [I.V.:239.5; IV Piggyback:641.7] Out: 2075 [Urine:2075]  PHYSICAL EXAMINATION: General: ill appearing male lying in bed HEENT: MM pink/dry, dried blood in mouth, + JVD  Neuro: awake, appears anxious CV: s1s2 rrr, tachy PULM: even/non-labored, lungs bilaterally diminished, bronchial breath sounds on left GI: soft, non-tender, bsx4 active  Extremities: warm/dry, 1+ pitting BLE edema  Skin: pale, bluish tint, mottling   LABS:  BMET Recent  Labs  Lab 04/02/2017 0640 02/22/2017 0512  NA 144 137  K 2.7* 3.7  CL 107 101  CO2 29 24  BUN 12 16  CREATININE 0.67 0.79  GLUCOSE 119* 128*    Electrolytes Recent Labs  Lab 04-02-2017 0640 03/19/2017 0512  CALCIUM 7.8* 7.8*  MG 1.7 1.7    CBC Recent Labs  Lab 2017/04/02 0640  2017/04/02 1717 2017-04-02 2137 03/07/2017 0512  WBC 13.5*  --   --   --  16.9*  HGB 11.9*   < > 12.5* 12.9* 11.9*  HCT 36.6*   < > 39.7 40.0 37.4*  PLT 227  --   --   --  273   < > = values in this interval not displayed.    Coag's Recent Labs  Lab 2017/04/02 0640  APTT 69*  INR 1.70    Sepsis Markers No results for input(s): LATICACIDVEN, PROCALCITON, O2SATVEN in the last 168 hours.  ABG Recent Labs  Lab 04/02/2017 1005  PHART 7.451*  PCO2ART 45.5  PO2ART 129*    Liver Enzymes Recent Labs  Lab 2017-04-02 0640  AST 41  ALT 32  ALKPHOS 77  BILITOT 1.4*  ALBUMIN 2.1*    Cardiac Enzymes Recent Labs  Lab April 02, 2017 0640 04/02/2017 0934  TROPONINI 0.09* 0.07*    Glucose No results for input(s): GLUCAP in the last 168 hours.  Imaging CT angiogram 02/11/17-extensive bilateral emboli, consolidation of the left lower lobe.  CT scan 02/19/17-moderate to  large loculated left effusion, dense airspace consolidation of the left lower lobe.  CT scan [facility] 03/08/17-Persistent pulmonary embolism on the left with mild improvement in pulmonary embolism on the right.  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     STUDIES:  CT Head 2/17 >> mild atrophy, no acute intracranial process  CULTURES: Sputum 2/18 >>   ANTIBIOTICS: Vancomycin 2/17 >> Zosyn 2/17 >>  SIGNIFICANT EVENTS: 2/17 Transfer from [place] with c/o melena  2/18  Increased agitation, O2 needs  LINES/TUBES:   DISCUSSION: 62 year old with complicated history, severe cardiomyopathy, recent PE, DVT  on anticoagulation.  Developed GI bleed with melena, hemoptysis, bleeding from the Foley at [facility] > stable now.    CT scan 2/16 reviewed which shows no new clot.  He has extensive consolidation with cavitation of the left lung with bronchopleural fistula.  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  History noted for left chest tube placed early February for loculated effusion by IR.   A: Acute pulmonary embolism, DVT (thrombus in transit seen in RA on ECHO 02/11/17) Hemoptysis  P: Hold heparin gtt for now with ongoing hemoptysis  Pt is currently in no position to have IVC filter placed Monitor for further bleeding, volume  A: Left lung consolidation, cavitation, bronchopleural fistula - culture negative pleural fluid from IR chest tube 2/1 P: Vancomycin + Zosyn as above  Follow cultures, PCT  Not a candidate for FOB NPO due to increased WOB > ok to eat if stabilized per SLP   A: Acute on Hypoxic Respiratory Failure - in setting of PE, L lung consolidation  P: O2 to support sats 88-95% Consider morphine for increased work of breathing / air hunger Pulmonary hygiene as able   A: Pulmonary HTN - PA Peak 56  P: Supportive care O2 as above  GOC - DNR.     Canary Brim, NP-C  Pulmonary & Critical Care Pgr: [phone] or if no answer [phone] 02/24/2017, 12:20 PM

## 2017-03-21 NOTE — Consult Note (Signed)
[facility] Nurse wound consult note Reason for Consult: perineal MASD (IAD) with mild moisture accumulation between the gluteal cleft.  Sacrum intact. Heels intact. Small resolving blood blister on the right plantar foot, 5th met head. Wound type:Moisture Pressure Injury POA: NA Measurement: perirectal, perineal (scrotal) and moisture accumulation between gluteal cleft Wound RKV:[id number] partial thickness Drainage (amount, consistency, odor) scant serous Periwound:erythematous, macerated Dressing procedure/placement/frequency: Patient is on a mattress replacement with low air loss feature.  Guidance for turning and repositioning, HOB elevation and topical care is provided.  Bilateral pressure redistribution heel boots are provided. [facility] nursing team will not follow, but will remain available to this patient, the nursing and medical teams.  Please re-consult if needed. Thanks, Maudie Flakes, MSN, RN, [place], Arther Abbott  Pager# [phone]

## 2017-03-21 NOTE — Discharge Summary (Signed)
[facility] Death Note                                                                                                                                                                                               Stephen Fleming, is a 62 y.o. male, DOB - 1955/11/12, [id number]  Admit date - 03/03/2017   Admitting Physician Rometta Emery, MD  Outpatient Primary MD for the patient is Louie Boston., MD  LOS - 1  C.Complaint - Hemoptysis, PE     Notification: Louie Boston., MD notified of death of 03-14-17   Date and Time of Death - Jun 03, 1802  Pronounced by - RN  History of present illness:    Stephen Fleming is a 62 y.o. male with a history of -  Stephen Fleming is a 62 y.o. 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    Patient had originally been seen at [facility] on 1/21, with bilateral leg pain, hemoptysis, severe dyspnea on exertion, found to have bilateral LE DVT's and acute submassive PE on CTA with RV/LV of 1, right heart strain noted on TTE. He was placed on heparin, somewhat hypotensive and was transferred to [facility] for consideration of thrombolytics. No thrombolytics were indicated. Heart failure team was consulted for acute systolic LV and right heart failure.He experienced PAF with RVR since returning to [facility] on amiodarone.   On admission at [facility] on 2/11, patient was initially noted to be hypoxic with O2 saturations 85% on room air initially and blood pressures borderline hypotensive.  Examination revealed melanotic stools  that were guaiac positive.  Hemoglobin at that time was noted to be 11.7.  Patient also had blood in his Foley catheter and a stage I decubitus ulcer noted.  Due to his symptoms it was felt the bleeding was related to the effects of anticoagulation in case syndrome was given as well as 1 unit of blood.  Repeat hemoglobin was noted to remain at 11 on admission.  CT scan of the abdomen in the emergency room showed signs of pyelonephritis and signs of a developing pneumonia in the right middle and lower lobes suspected to be due to aspiration as patient has had issues with dysphasia during his previous hospital admissions.  During this period in time patient remains off anticoagulation and  was given empiric antibiotics.  GI bleed symptoms resolved as well as hemoptysis.  He was evaluated by GI with colonoscopy and EGD on 2/14, and bleed symptoms and it was thought to likely be diverticular in nature.  However, patient was reported to have developed hemoptysis again on 2/16.  A CT scan was performed showing multiple lung lesions consistent with new pulmonary emboli and reported caseating lesion.  He also was noted to have gone into A. fib with RVR for which he was placed on a Cardizem drip with improvement of heart rates.  No reports of repeat GI bleed noted.  Transfer was accepted to stepdown bed at [facility].  Patient after admission, was seen by [facility], unfortunately on 03-15-2017 he developed another episode of Hemoptysis after which he became more short of breath and cyanotic, fortunately at that time critical care was seeing the patient, patient was known to Dr. Kendrick Fries from his previous hospitalization.  Dr. Kendrick Fries discussed his poor prognosis with family in detail, at this time he was been transitioned to morphine drip.   Family was in agreement with the plan.  He passed away at 1804.    Final Diagnoses:  Cause if death - Aspiration, Hemoptysis, PE, CHF  Signature  Susa Raring M.D on  03-15-17 at 6:13 PM  [facility]  Office Phone -[phone]  Total clinical and documentation time for today Under 30 minutes   Last Note       @IPLOG @        PROGRESS NOTE                                                                                                                                                                                                             Patient Demographics:    Stephen Fleming, is a 63 y.o. male, DOB - 29-Jul-1955, [id number]:[id number]  Admit date - 02/25/2017   Admitting Physician Rometta Emery, MD  Outpatient Primary MD for the patient is Louie Boston., MD  LOS - 1  No chief complaint on file.      Brief Narrative   Stephen Fleming is a 62 y.o. 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    Patient had originally been seen at  [facility] on 1/21, with bilateral leg pain, hemoptysis, severe dyspnea on exertion, found to have bilateral LE DVT's and acute submassive PE on CTA with RV/LV of 1, right heart strain noted on TTE. He was placed on heparin, somewhat hypotensive and was transferred to [facility] for consideration of thrombolytics. No thrombolytics were indicated. Heart failure team was consulted for acute systolic LV and right heart failure.He experienced PAF with RVR since returning to NSR on amiodarone.   On admission at [facility] on 2/11, patient was initially noted to be hypoxic with O2 saturations 85% on room air initially and blood pressures borderline hypotensive.  Examination revealed melanotic stools that were guaiac positive.  Hemoglobin at that time was noted to be  11.7.  Patient also had blood in his Foley catheter and a stage I decubitus ulcer noted.  Due to his symptoms it was felt the bleeding was related to the effects of anticoagulation in case syndrome was given as well as 1 unit of blood.  Repeat hemoglobin was noted to remain at 11 on admission.  CT scan of the abdomen in the emergency room showed signs of pyelonephritis and signs of a developing pneumonia in the right middle and lower lobes suspected to be due to aspiration as patient has had issues with dysphasia during his previous hospital admissions.  During this period in time patient remains off anticoagulation and was given empiric antibiotics.  GI bleed symptoms resolved as well as hemoptysis.  He was evaluated by GI with colonoscopy and EGD on 2/14, and bleed symptoms and it was thought to likely be diverticular in nature.  However, patient was reported to have developed hemoptysis again on 2/16.  A CT scan was performed showing multiple lung lesions consistent with new pulmonary emboli and reported caseating lesion.  He also was noted to have gone into A. fib with RVR for which he was placed on a Cardizem drip with improvement of heart rates.  No reports of repeat GI bleed noted.  Transfer was accepted to stepdown bed at [facility].  After more research it was found the patient did not have new blood clots on the CT scan done on 03/08/2017 at [facility], also son reveals that he has been minimally responsive for the last 1 week at that facility.   Subjective:   Patient in bed, appears to be in no distress, he is more awake today but still confused, denies any headache chest or abdominal pain, does agree to mild shortness of breath.   Assessment  & Plan :     1.  Chronic atrial fibrillation with RVR.  [place] vas 2 score of at least 3.  Initially required Cardizem drip, will avoid further Cardizem due to depressed EF, currently rate controlled, since oral status is tenuous have placed him on  scheduled low-dose scheduled and as needed Lopressor, 1 dose of IV digoxin was given on 2017/03/22, resume oral Lopressor, amiodarone and digoxin combination.  If rate control becomes challenging we might have to consider amiodarone in the light of his soft blood pressures, heparin if he is able to tolerate, Eliquis on hold due to bleeding below.  Cardiology on board.     2.  History of DVT PE.  Less than 1 month ago, no new clots on CT scan done at  [facility] on 03/08/2017, currently on heparin drip, was on Eliquis prior to this.  On 03/11/2017 some hemoptysis again noted in ICU, hold heparin, will discuss with pulmonary if  he needs IVC filter or if it safer to wait and watch for 24 hours.  Lower extremity venous duplex ordered to look at clot burden.  3. HX of GI bleed and possible mild hemoptysis.  Per family recent EGD colonoscopy at [facility] unremarkable, this was affected to be diverticular bleed in the setting of Eliquis, mild hemoptysis likely due to cavitary lung lesion versus lung infarct due to large PE.  Type screen done.  Anticoagulation as above.  [facility] stable.  4.  Cavitary lung lesion on the right side on CT scan done on 03/08/2017 at [facility], intermittent hemoptysis.  Could be post infarct lung but could be also due to ongoing dysphagia and aspiration, he has had issues with swallowing food at [facility].  For now placed on vancomycin and Zosyn since he had leukocytosis, n.p.o. till mentation is better, speech to evaluate.  We will continue to monitor PCCM on board.  5.  Metabolic and toxic encephalopathy with delirium.  Supportive care, CT head done on 04-08-17 shows no acute infarct.  6.  CAD with ischemic +/- Alcoholic cardiomyopathy and chronic combined systolic and diastolic CHF with last EF 10% a few weeks ago.  Supportive care, avoid calcium channel blocker, beta-blocker ACE inhibitor and Lasix once blood pressure allows.  Cardiology consulted.  Currently troponin  trend is stable and likely mild rise due to demand ischemia from #1 above.  Low-dose Lasix as tolerated by blood pressure for now.  Currently does not appear to be in frank CHF.  Due to bleeding will avoid aspirin, statin, oral beta-blocker, ACE inhibitor, oral Lasix once mentation improves and blood pressure is better.  Repeat echocardiogram noted.  7.  Recent possible diverticular bleed.  Currently stable with monitor.  8.  Hypokalemia.  Replaced.  Monitor.  9.  History of mural thrombus on last echocardiogram several weeks ago.  Was on anticoagulation, not noted on recent echocardiogram this admission.  10.  Remote history of alcohol abuse.  No acute issue.    Addendum around 1 PM patient had an episode of hemoptysis after which he became more short of breath and cyanotic, fortunately at that time critical care was seeing the patient, patient was known to Dr. Kendrick Fries from his previous hospitalization.  Dr. Kendrick Fries discussed his poor prognosis with family in detail, at this time he has been transitioned to morphine drip.  Expect him to pass away soon.  Family in agreement with the plan.    Diet : DIET - DYS 1 Room service appropriate? Yes; Fluid consistency: Thin    Family Communication  : Wife, sons and home family bedside on 2017-04-08, told in detail that his long-term prognosis is not good, told them to be prepared for an adverse outcome as it happened this admission.  Code Status : DNR  Disposition Plan  : Stay in PCU  Consults  : Pulmonary, cardiology  Procedures  :    CT head.  Nonacute.  Echocardiogram - - Left ventricle: The cavity size was moderately dilated. Wall  thickness was increased in a pattern of mild LVH. Systolic function was severely reduced. The estimated ejection fraction was in the range of 10% to 15%. Diffuse hypokinesis. Features are consistent with a pseudonormal left ventricular filling pattern,  with concomitant abnormal relaxation and increased filling  pressure (grade 2 diastolic dysfunction). - Aortic valve: Transvalvular velocity was within the normal range. There was no stenosis. There was mild to moderate regurgitation. Regurgitation pressure half-time: 427 ms. -  Mitral valve: Calcified annulus. Transvalvular velocity was  within the normal range. There was no evidence for stenosis. There was moderate regurgitation directed posteriorly. - Left atrium: The atrium was moderately dilated. - Right ventricle: The cavity size was mildly dilated. Wall  thickness was normal. Systolic function was normal. - Right atrium: The atrium was mildly dilated. - Tricuspid valve: There was moderate regurgitation. - Pulmonary arteries: Systolic pressure was moderately increased.  PA peak pressure: 56 mm Hg (S).   Lower extremity venous duplex    DVT Prophylaxis  :    Heparin    Lab Results  Component Value Date   PLT 273 03/19/2017    Inpatient Medications  Scheduled Meds: . amiodarone  200 mg Oral Daily  . atorvastatin  40 mg Oral q1800  . [START ON 03/11/2017] carvedilol  3.125 mg Oral BID WC  . Chlorhexidine Gluconate Cloth  6 each Topical Q0600  . digoxin  0.125 mg Oral Daily  . feeding supplement (ENSURE ENLIVE)  237 mL Oral BID BM  . feeding supplement (PRO-STAT SUGAR FREE 64)  30 mL Oral TID WC  . furosemide  20 mg Intravenous Daily  . mometasone-formoterol  2 puff Inhalation BID  . multivitamin with minerals  1 tablet Oral Daily  . mupirocin ointment  1 application Nasal BID  . polyethylene glycol  17 g Oral Daily  . QUEtiapine  25 mg Oral BID  . sucralfate  1 g Oral TID WC & HS   Continuous Infusions: . dextrose 5 % with KCl 20 mEq / L Stopped (03/07/2017 1600)  . heparin Stopped (02/27/2017 1100)  . morphine 3 mg/hr (03/07/2017 1634)   PRN Meds:.acetaminophen **OR** acetaminophen, ipratropium-albuterol, morphine injection, morphine, [DISCONTINUED] ondansetron **OR** ondansetron (ZOFRAN) IV  Antibiotics  :    Anti-infectives (From  admission, onward)   Start     Dose/Rate Route Frequency Ordered Stop   03-16-17 2200  vancomycin (VANCOCIN) IVPB 1000 mg/200 mL premix  Status:  Discontinued     1,000 mg 200 mL/hr over 60 Minutes Intravenous Every 12 hours 03-16-2017 1743 02/28/2017 1519   March 16, 2017 1030  piperacillin-tazobactam (ZOSYN) IVPB 3.375 g  Status:  Discontinued     3.375 g 12.5 mL/hr over 240 Minutes Intravenous Every 8 hours 03/16/2017 1014 02/27/2017 1519   03-16-17 1030  vancomycin (VANCOCIN) 1,500 mg in sodium chloride 0.9 % 500 mL IVPB     1,500 mg 250 mL/hr over 120 Minutes Intravenous  Once 03/16/2017 1014 16-Mar-2017 1350         Objective:   Vitals:   03/11/2017 0400 03/20/2017 0500 02/28/2017 0800 03/19/2017 1200  BP: 93/70     Pulse: (!) 101     Resp: (!) 21     Temp:   99.1 F (37.3 C) 98.8 F (37.1 C)  TempSrc:   Oral Oral  SpO2: 97%     Weight:  68.6 kg (151 lb 3.8 oz)    Height:        Wt Readings from Last 3 Encounters:  03/07/2017 68.6 kg (151 lb 3.8 oz)  02/26/17 76.2 kg (167 lb 15.9 oz)  01/22/17 80.7 kg (178 lb)     Intake/Output Summary (Last 24 hours) at 03/09/2017 1813 Last data filed at 03/15/2017 1800 Gross per 24 hour  Intake 1253.29 ml  Output 500 ml  Net 753.29 ml     Physical Exam  Awake , less somnolent but still confused, No new F.N deficits, Normal affect [place].AT,PERRAL Supple Neck,No JVD, No  cervical lymphadenopathy appriciated.  Symmetrical Chest wall movement, Good air movement bilaterally, CTAB RRR,No Gallops, Rubs or new Murmurs, No Parasternal Heave +ve B.Sounds, Abd Soft, No tenderness, No organomegaly appriciated, No rebound - guarding or rigidity. No Cyanosis, Clubbing or edema, No new Rash or bruise       Data Review:    CBC Recent Labs  Lab 03-15-17 0640 03/15/2017 0934 2017/03/15 1717 03/15/17 2137 03/08/2017 0512  WBC 13.5*  --   --   --  16.9*  HGB 11.9* 11.9* 12.5* 12.9* 11.9*  HCT 36.6* 36.7* 39.7 40.0 37.4*  PLT 227  --   --   --  273  MCV 93.8  --    --   --  93.7  MCH 30.5  --   --   --  29.8  MCHC 32.5  --   --   --  31.8  RDW 16.2*  --   --   --  16.0*  LYMPHSABS 2.7  --   --   --   --   MONOABS 1.0  --   --   --   --   EOSABS 0.1  --   --   --   --   BASOSABS 0.1  --   --   --   --     Chemistries  Recent Labs  Lab 15-Mar-2017 0640 02/28/2017 0512  NA 144 137  K 2.7* 3.7  CL 107 101  CO2 29 24  GLUCOSE 119* 128*  BUN 12 16  CREATININE 0.67 0.79  CALCIUM 7.8* 7.8*  MG 1.7 1.7  AST 41  --   ALT 32  --   ALKPHOS 77  --   BILITOT 1.4*  --    ------------------------------------------------------------------------------------------------------------------ No results for input(s): CHOL, HDL, LDLCALC, TRIG, CHOLHDL, LDLDIRECT in the last 72 hours.  Lab Results  Component Value Date   HGBA1C 5.4 12/05/2016   ------------------------------------------------------------------------------------------------------------------ Recent Labs    03/15/17 0934  TSH 2.800   ------------------------------------------------------------------------------------------------------------------ No results for input(s): VITAMINB12, FOLATE, FERRITIN, TIBC, IRON, RETICCTPCT in the last 72 hours.  Coagulation profile Recent Labs  Lab 03-15-17 0640  INR 1.70    No results for input(s): DDIMER in the last 72 hours.  Cardiac Enzymes Recent Labs  Lab 03/15/2017 0640 March 15, 2017 0934  TROPONINI 0.09* 0.07*   ------------------------------------------------------------------------------------------------------------------    Component Value Date/Time   BNP 1,781.4 (H) 02/28/2017 0726    Micro Results Recent Results (from the past 240 hour(s))  MRSA PCR Screening     Status: Abnormal   Collection Time: Mar 15, 2017  4:29 AM  Result Value Ref Range Status   MRSA by PCR POSITIVE (A) NEGATIVE Final    Comment:        The GeneXpert MRSA Assay (FDA approved for NASAL specimens only), is one component of a comprehensive MRSA  colonization surveillance program. It is not intended to diagnose MRSA infection nor to guide or monitor treatment for MRSA infections. CRITICAL RESULT CALLED TO, READ BACK BY AND VERIFIED WITH: S.Susann Givens RN 03-15-17 0740 JR Performed at [facility], [address] [place], [place] [address]     Radiology Reports   Ct Head Wo Contrast  Result Date: March 15, 2017 CLINICAL DATA:  Altered level of consciousness. EXAM: CT HEAD WITHOUT CONTRAST TECHNIQUE: Contiguous axial images were obtained from the base of the skull through the vertex without intravenous contrast. COMPARISON:  02/22/2017 FINDINGS: Brain: No evidence of acute infarction, hemorrhage, hydrocephalus, extra-axial collection or mass  lesion/mass effect. Prominence of the sulci and ventricles identified compatible with brain atrophy. Vascular: No hyperdense vessel or unexpected calcification. Skull: Normal. Negative for fracture or focal lesion. Sinuses/Orbits: No acute finding. Other: None. IMPRESSION: 1. No acute intracranial abnormalities. 2. Mild brain atrophy. Electronically Signed   By: Signa Kell M.D.   On: 04/06/17 10:41     Time Spent in minutes  30   Susa Raring M.D on 03/02/2017 at 6:13 PM  Between 7am to 7pm - Pager - [phone] ( page via [email or web address], text pages only, please mention full 10 digit call back number). After 7pm go to [email or web address] - password [email or web address]

## 2017-03-21 DEATH — deceased

## 2018-10-19 IMAGING — CT CT CHEST W/O CM
2 of 3 series · 15 of 36 positions shown, 18 images · non-contrast
Comparison: 02/11/2017

CLINICAL DATA: Acute respiratory illness.  Pleural effusion.

EXAM:
CT CHEST WITHOUT CONTRAST
TECHNIQUE: Multidetector CT imaging of the chest was performed following the
standard protocol without IV contrast.

[Series 4: thorax 2.0 · axial · 0.84mm/px · z∈[+1100,+1356]mm · 12 of 152 slices shown, 15 images]
[im 12/152  mediastinal]
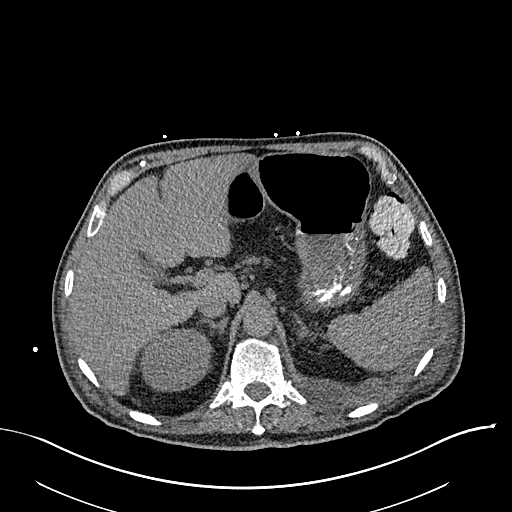
[im 12/152  lung]
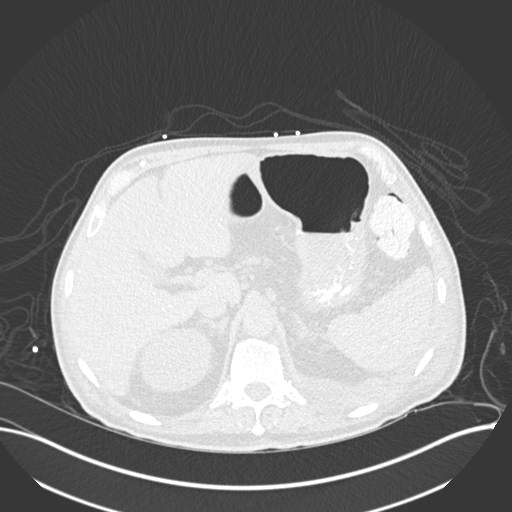
[im 23/152  lung]
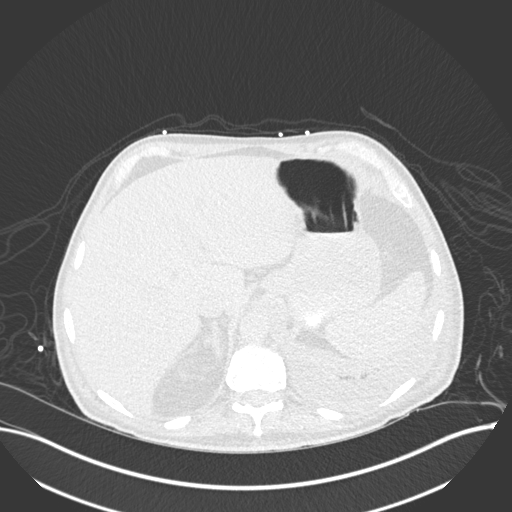
[im 34/152  lung]
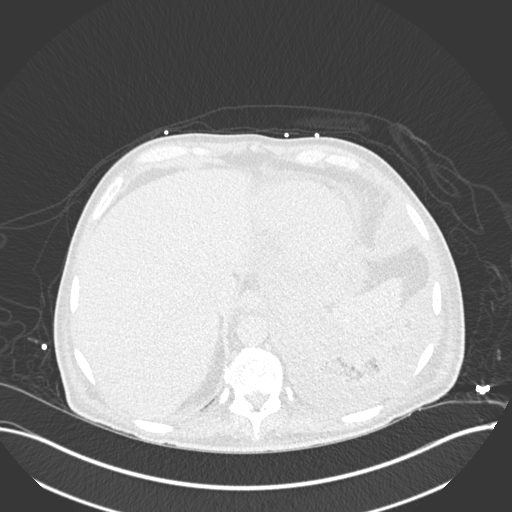
[im 45/152  lung]
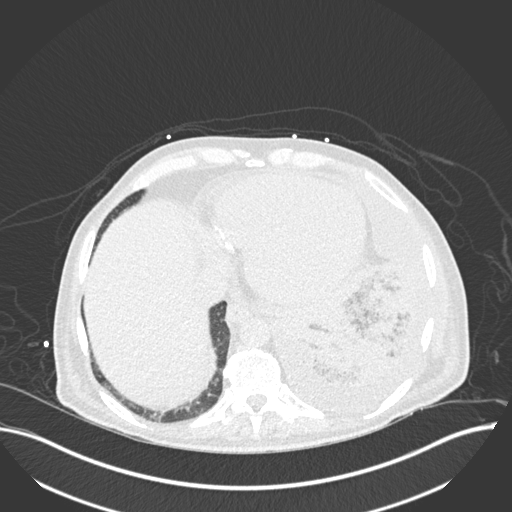
[im 56/152  mediastinal]
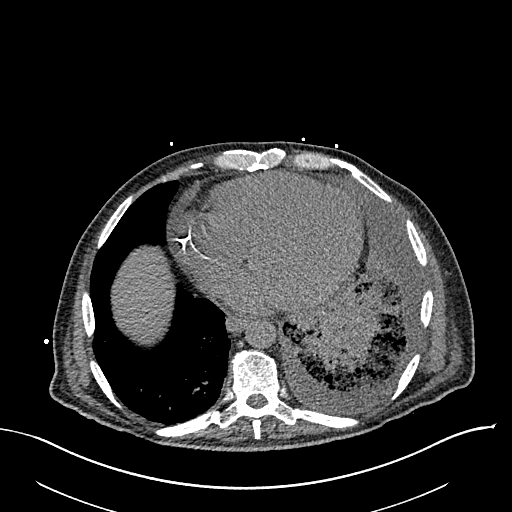
[im 56/152  lung]
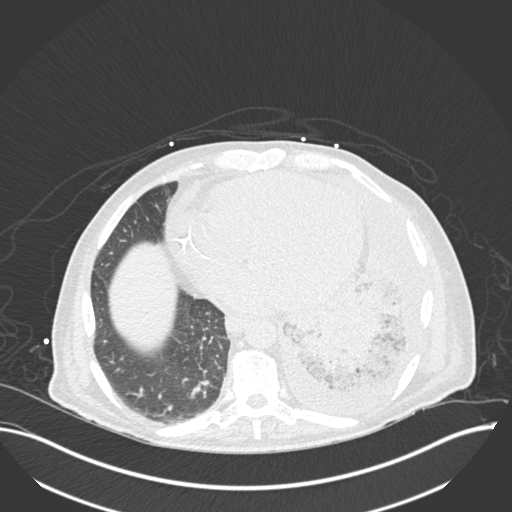
[im 68/152  lung]
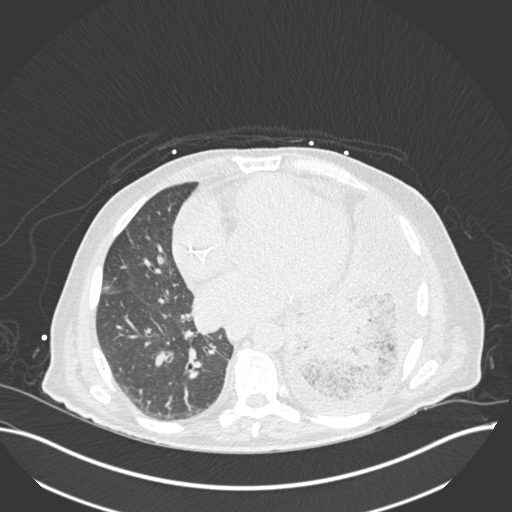
[im 84/152  lung]
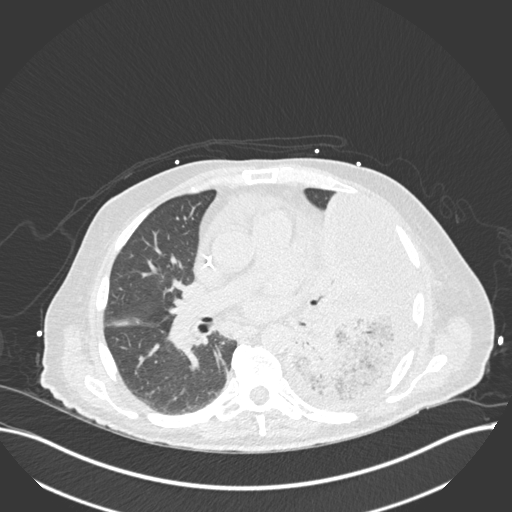
[im 96/152  lung]
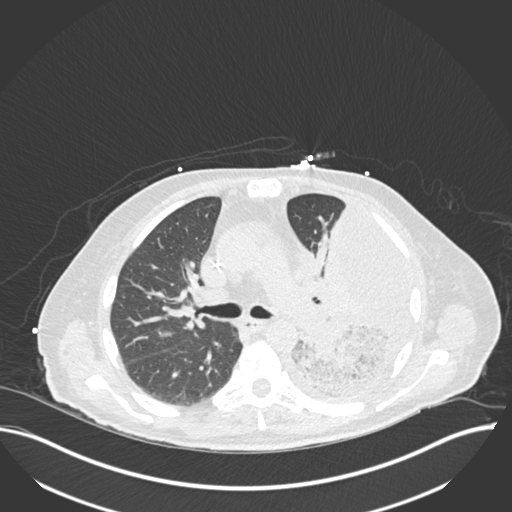
[im 107/152  mediastinal]
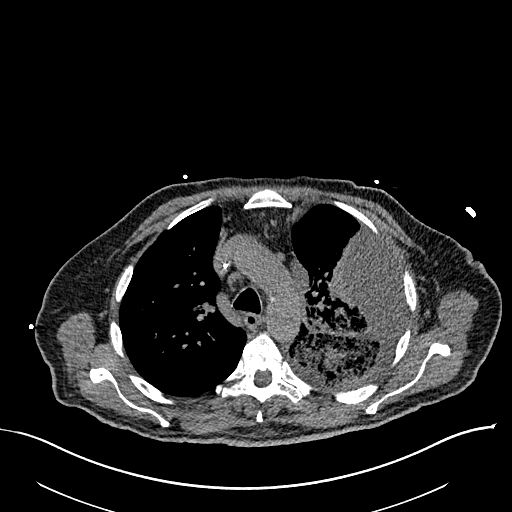
[im 107/152  lung]
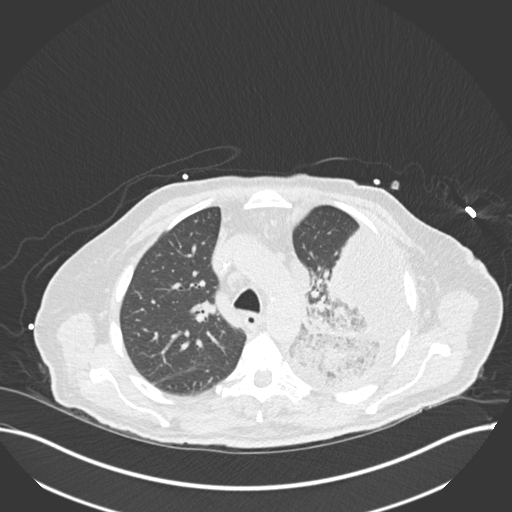
[im 118/152  lung]
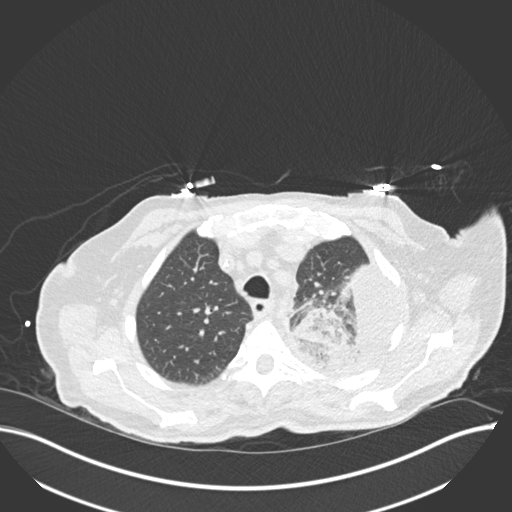
[im 129/152  lung]
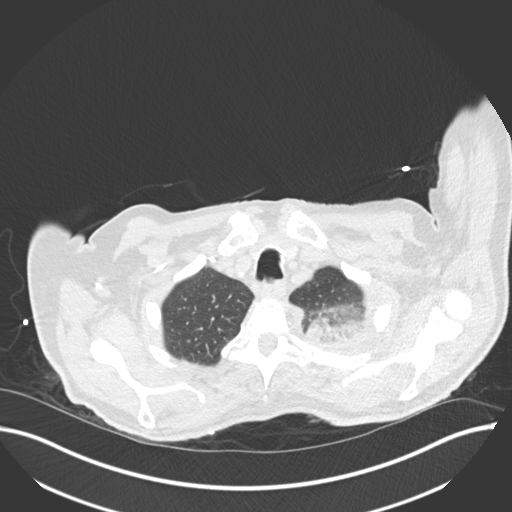
[im 140/152  lung]
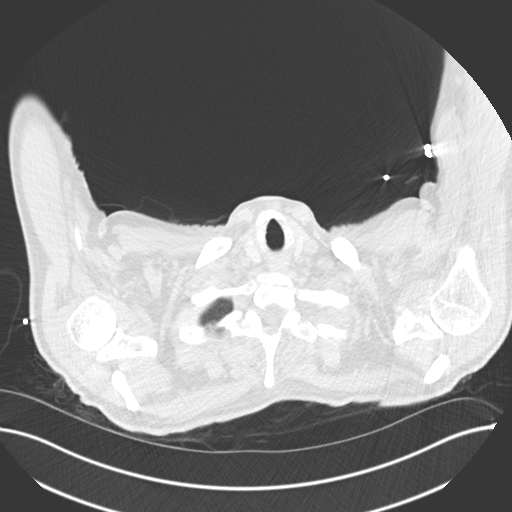

[Series 6: coronal · coronal · 0.59mm/px · 3 of 89 slices shown]
[im 18/89  lung]
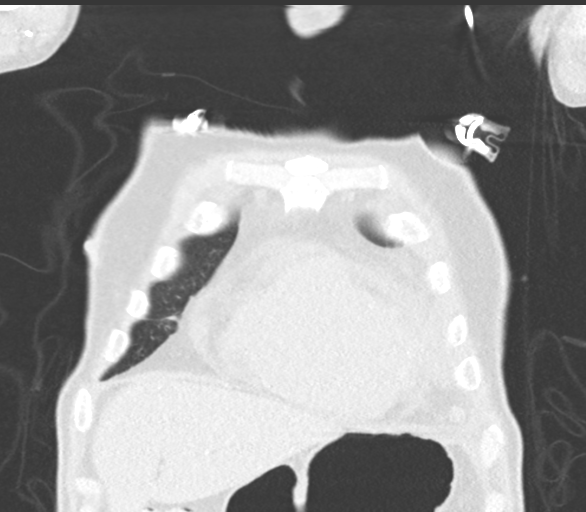
[im 36/89  lung]
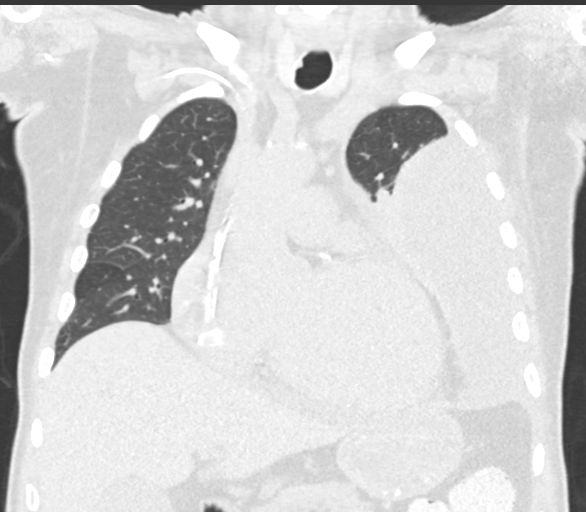
[im 53/89  lung]
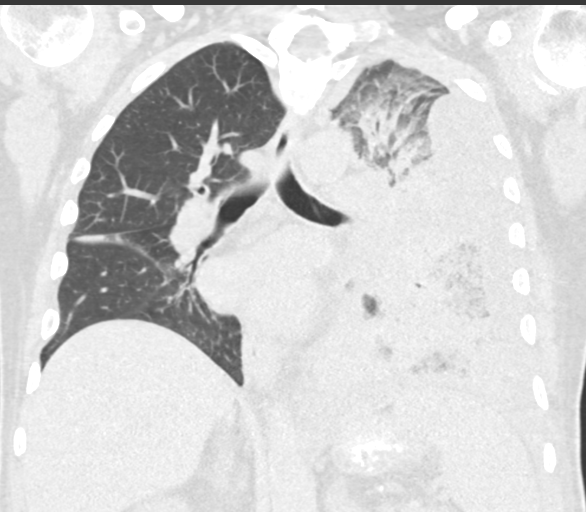

[15 of 36 positions shown; findings below may reference images not displayed]

FINDINGS: Cardiovascular: There is moderate cardiac enlargement. Aortic
atherosclerosis. Calcification in the LAD and left circumflex and
RCA coronary arteries noted.

Mediastinum/Nodes: The trachea appears patent and is midline. Normal
appearance of the esophagus. Multiple prominent, non pathologically
enlarged mediastinal lymph nodes identified. No axillary or
supraclavicular adenopathy.

Lungs/Pleura: There is a moderate to large loculated left pleural
effusion identified which is new from the previous exam. There is
dense airspace consolidation involving the entire left lower lobe
and the posterior left upper lobe. Mild subpleural consolidation
within the posterior right middle lobe noted, image 17 of series 7.

Upper Abdomen: No acute abnormality.

Musculoskeletal: Degenerative disc disease identified within the
thoracic spine. No aggressive lytic or sclerotic bone lesions.
IMPRESSION: 1. Moderate to large loculated left pleural effusion.
2. Dense airspace consolidation involves the entire left lower lobe
and posterior left upper lobe which may be the sequelae of pulmonary
infarct and/or infection.
3. Mild subpleural consolidation in the posterior right middle lobe.
4. Aortic Atherosclerosis ([id number]). Three vessel coronary
artery calcifications noted.

## 2018-10-24 IMAGING — CR DG CHEST 1V PORT
1 series · 1 of 1 positions shown · non-contrast
Comparison: 02/19/2017

CLINICAL DATA: Loculated pleural effusion on the left

EXAM:
PORTABLE CHEST 1 VIEW

[AP]
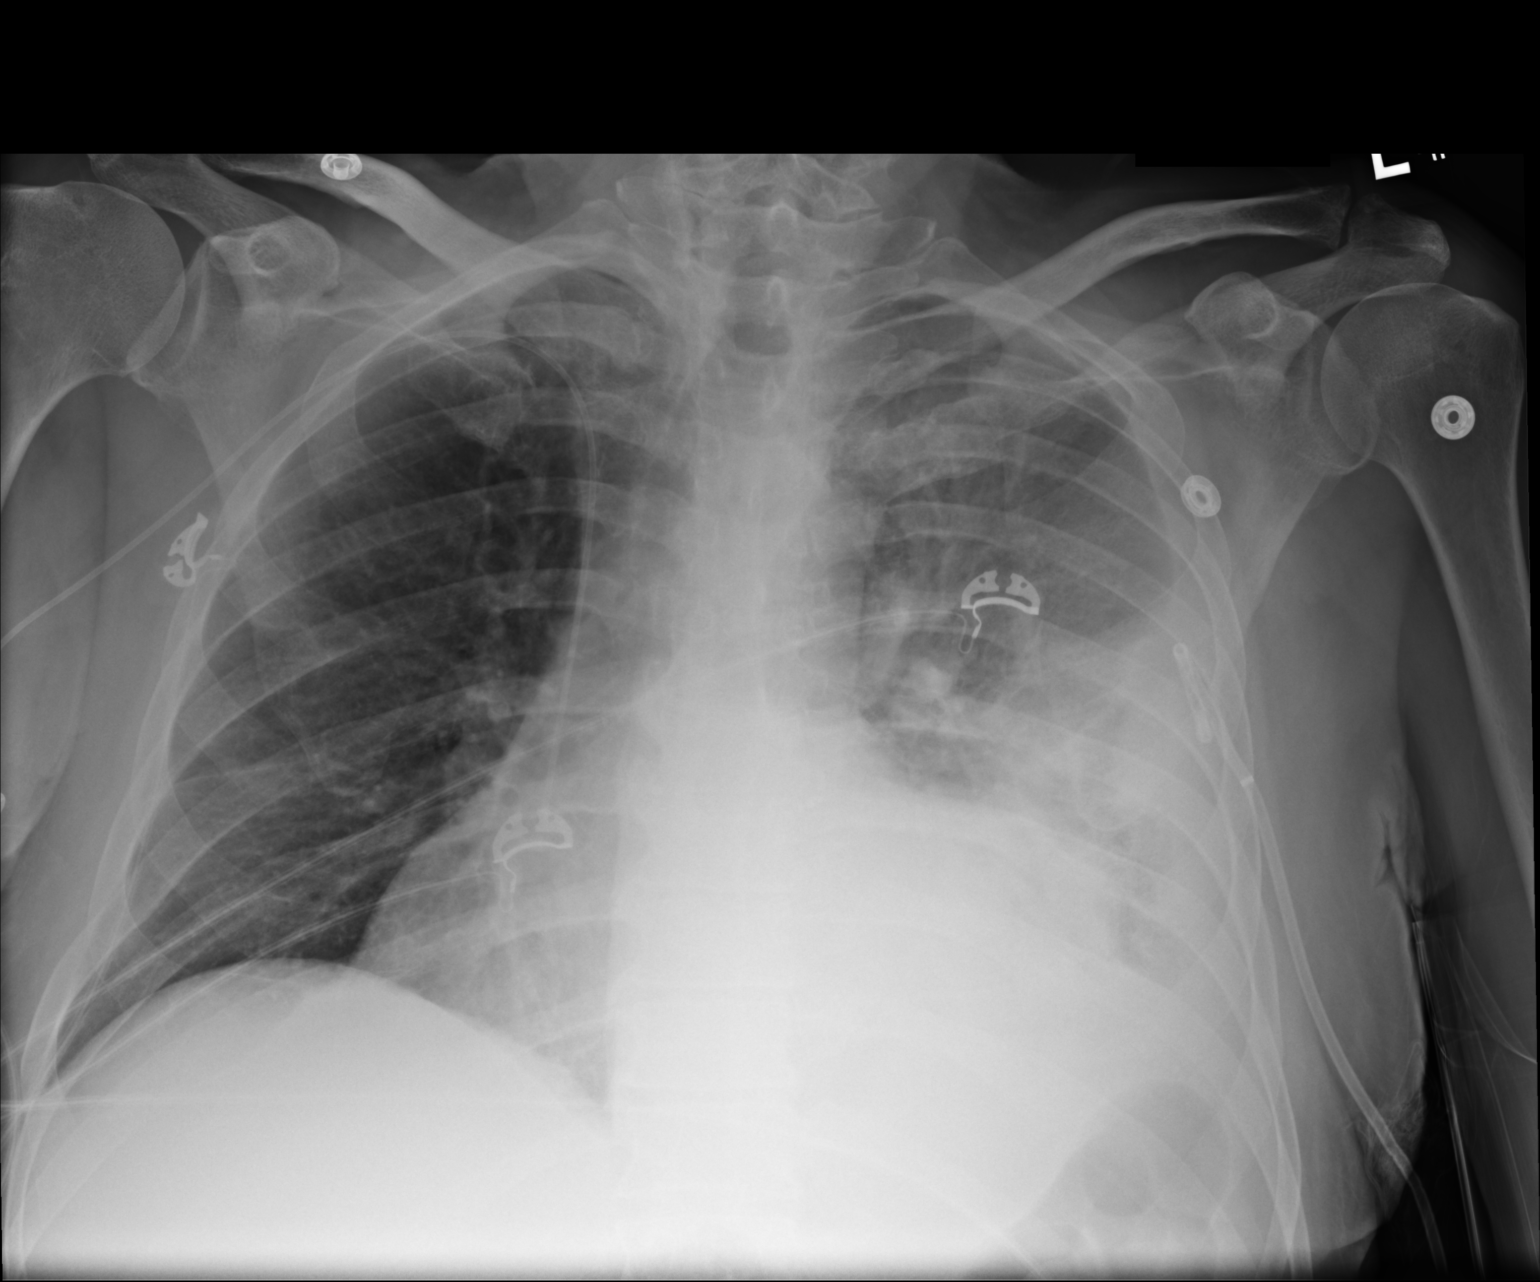

[1 of 1 positions shown; findings below may reference images not displayed]

FINDINGS: Right-sided PICC line is noted. Cardiac shadow is enlarged. A
drainage catheter is noted on the left in the previously seen
loculated pleural effusion. It has demonstrated significant decrease
in size although a small component remains. Left basilar
infiltrative changes are noted similar to that seen on prior CT. The
right lung remains clear. No bony abnormality is seen.
IMPRESSION: Significant reduction in left loculated pleural effusion following
drainage. Persistent left basilar infiltrate is noted.

## 2018-10-26 IMAGING — DX DG CHEST 1V PORT
1 series · 1 of 1 positions shown · non-contrast
Comparison: 02/24/2017

CLINICAL DATA: Respiratory failure

EXAM:
PORTABLE CHEST 1 VIEW

[chest ap]
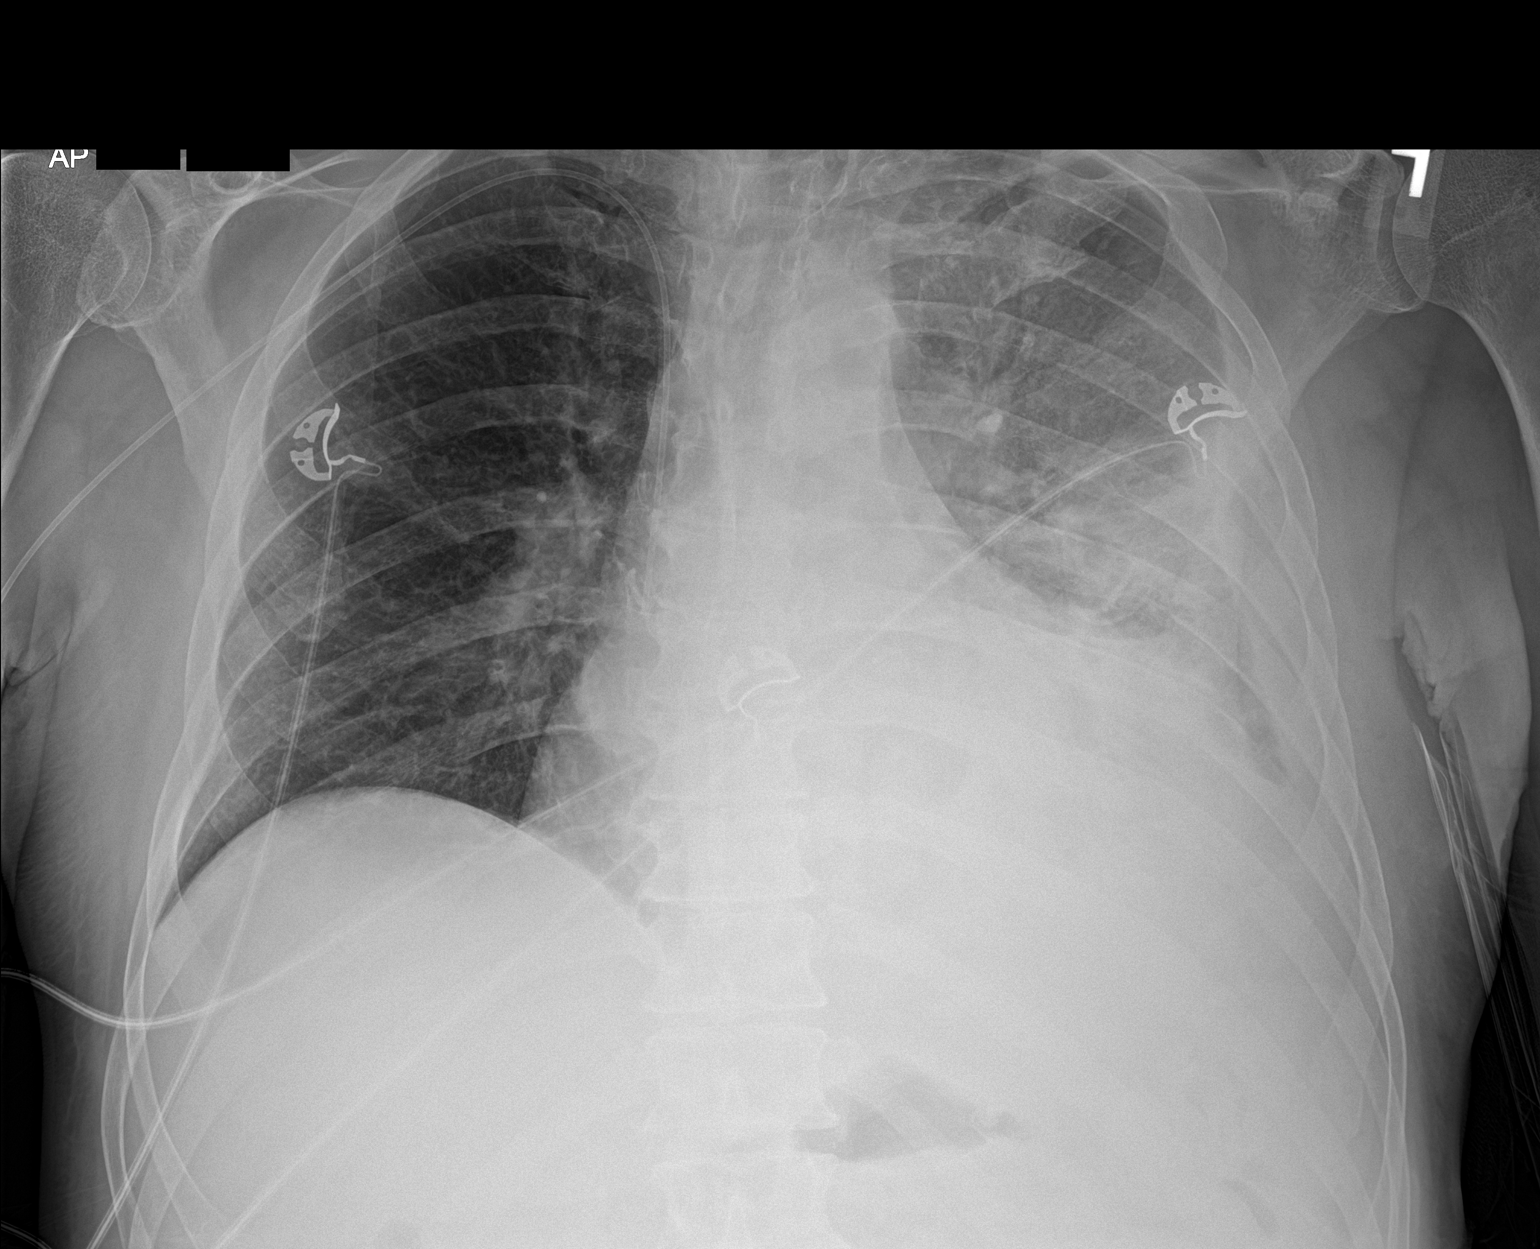

[1 of 1 positions shown; findings below may reference images not displayed]

FINDINGS: Right PICC line remains in place with the tip in the SVC.
Cardiomegaly. Diffuse left lung airspace disease with moderate left
effusion, similar to prior study. No confluent opacity on the right.
IMPRESSION: Stable diffuse left lung airspace disease and left effusion.

Cardiomegaly.

## 2018-11-07 IMAGING — DX DG CHEST 1V PORT
1 series · 1 of 1 positions shown · non-contrast
Comparison: CT from 2 days ago

CLINICAL DATA: Shortness of breath

EXAM:
PORTABLE CHEST 1 VIEW

[chest ap]
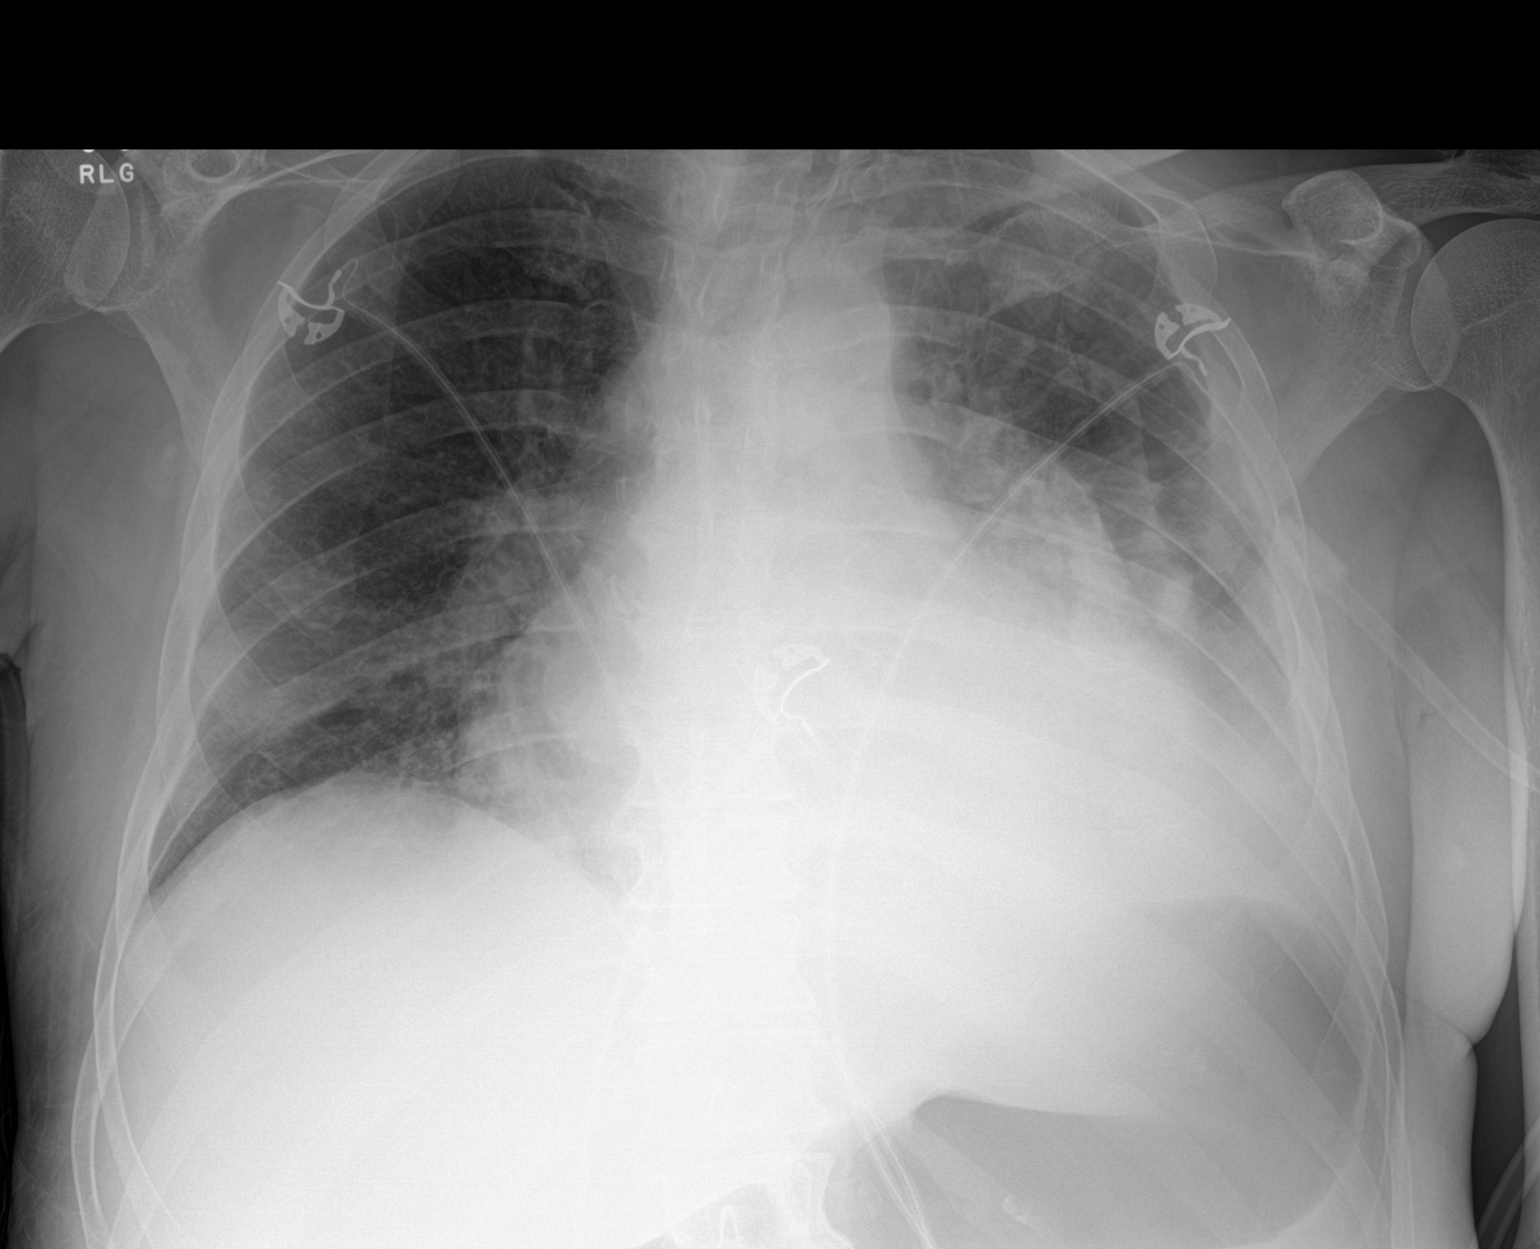

[1 of 1 positions shown; findings below may reference images not displayed]

FINDINGS: Cardiomegaly. Moderate left hydropneumothorax, likely loculated,
without convincing change from 2 days prior when allowing for
differences in positioning. Mild airspace opacity at the right base.
No Ker[place] lines where the lung is visible. No change from prior.
IMPRESSION: 1. No change in left hydropneumothorax and left more than right
airspace disease.
2. Cardiomegaly.
